# Patient Record
Sex: Female | Born: 1945
Health system: Southern US, Community
[De-identification: ages and names within clinical notes are randomized; demographics above are authoritative.]

## PROBLEM LIST (undated history)

## (undated) DIAGNOSIS — I10 Essential (primary) hypertension: Secondary | ICD-10-CM

## (undated) DIAGNOSIS — D649 Anemia, unspecified: Secondary | ICD-10-CM

## (undated) DIAGNOSIS — M1990 Unspecified osteoarthritis, unspecified site: Secondary | ICD-10-CM

## (undated) DIAGNOSIS — K219 Gastro-esophageal reflux disease without esophagitis: Secondary | ICD-10-CM

## (undated) DIAGNOSIS — H269 Unspecified cataract: Secondary | ICD-10-CM

## (undated) DIAGNOSIS — T7840XA Allergy, unspecified, initial encounter: Secondary | ICD-10-CM

## (undated) DIAGNOSIS — F419 Anxiety disorder, unspecified: Secondary | ICD-10-CM

## (undated) DIAGNOSIS — E785 Hyperlipidemia, unspecified: Secondary | ICD-10-CM

## (undated) DIAGNOSIS — F32A Depression, unspecified: Secondary | ICD-10-CM

## (undated) HISTORY — DX: Hyperlipidemia, unspecified: E78.5

## (undated) HISTORY — PX: EYE SURGERY: SHX253

## (undated) HISTORY — PX: APPENDECTOMY: SHX54

## (undated) HISTORY — DX: Essential (primary) hypertension: I10

## (undated) HISTORY — DX: Depression, unspecified: F32.A

## (undated) HISTORY — PX: BACK SURGERY: SHX140

## (undated) HISTORY — DX: Gastro-esophageal reflux disease without esophagitis: K21.9

## (undated) HISTORY — DX: Allergy, unspecified, initial encounter: T78.40XA

## (undated) HISTORY — DX: Anemia, unspecified: D64.9

## (undated) HISTORY — PX: TOTAL ABDOMINAL HYSTERECTOMY: SHX209

## (undated) HISTORY — PX: BREAST EXCISIONAL BIOPSY: SUR124

## (undated) HISTORY — PX: TUBAL LIGATION: SHX77

## (undated) HISTORY — DX: Anxiety disorder, unspecified: F41.9

## (undated) HISTORY — DX: Unspecified osteoarthritis, unspecified site: M19.90

## (undated) HISTORY — PX: BREAST CYST ASPIRATION: SHX578

## (undated) HISTORY — PX: FRACTURE SURGERY: SHX138

## (undated) HISTORY — PX: CATARACT EXTRACTION, BILATERAL: SHX1313

## (undated) HISTORY — DX: Unspecified cataract: H26.9

---

## 1999-08-03 ENCOUNTER — Other Ambulatory Visit: Admission: RE | Admit: 1999-08-03 | Discharge: 1999-08-03 | Payer: Self-pay | Admitting: Obstetrics and Gynecology

## 1999-12-21 ENCOUNTER — Encounter: Payer: Self-pay | Admitting: Obstetrics and Gynecology

## 1999-12-21 ENCOUNTER — Encounter: Admission: RE | Admit: 1999-12-21 | Discharge: 1999-12-21 | Payer: Self-pay | Admitting: Obstetrics and Gynecology

## 1999-12-30 ENCOUNTER — Encounter: Payer: Self-pay | Admitting: Internal Medicine

## 1999-12-30 ENCOUNTER — Encounter: Admission: RE | Admit: 1999-12-30 | Discharge: 1999-12-30 | Payer: Self-pay | Admitting: Internal Medicine

## 2000-08-31 ENCOUNTER — Encounter: Payer: Self-pay | Admitting: Internal Medicine

## 2000-08-31 ENCOUNTER — Encounter: Admission: RE | Admit: 2000-08-31 | Discharge: 2000-08-31 | Payer: Self-pay | Admitting: Internal Medicine

## 2000-09-20 ENCOUNTER — Other Ambulatory Visit: Admission: RE | Admit: 2000-09-20 | Discharge: 2000-09-20 | Payer: Self-pay | Admitting: Obstetrics and Gynecology

## 2001-10-06 ENCOUNTER — Other Ambulatory Visit: Admission: RE | Admit: 2001-10-06 | Discharge: 2001-10-06 | Payer: Self-pay | Admitting: Obstetrics and Gynecology

## 2002-09-18 ENCOUNTER — Other Ambulatory Visit: Admission: RE | Admit: 2002-09-18 | Discharge: 2002-09-18 | Payer: Self-pay | Admitting: Obstetrics and Gynecology

## 2003-05-13 ENCOUNTER — Encounter: Payer: Self-pay | Admitting: Internal Medicine

## 2003-05-13 ENCOUNTER — Encounter: Admission: RE | Admit: 2003-05-13 | Discharge: 2003-05-13 | Payer: Self-pay | Admitting: Internal Medicine

## 2003-05-31 ENCOUNTER — Encounter: Payer: Self-pay | Admitting: Obstetrics & Gynecology

## 2003-05-31 ENCOUNTER — Encounter: Admission: RE | Admit: 2003-05-31 | Discharge: 2003-05-31 | Payer: Self-pay | Admitting: Obstetrics & Gynecology

## 2003-06-13 ENCOUNTER — Ambulatory Visit (HOSPITAL_BASED_OUTPATIENT_CLINIC_OR_DEPARTMENT_OTHER): Admission: RE | Admit: 2003-06-13 | Discharge: 2003-06-13 | Payer: Self-pay | Admitting: General Surgery

## 2004-07-25 ENCOUNTER — Ambulatory Visit (HOSPITAL_COMMUNITY): Admission: RE | Admit: 2004-07-25 | Discharge: 2004-07-25 | Payer: Self-pay | Admitting: Family Medicine

## 2004-10-12 ENCOUNTER — Ambulatory Visit: Payer: Self-pay | Admitting: Internal Medicine

## 2005-01-28 ENCOUNTER — Ambulatory Visit: Payer: Self-pay | Admitting: Internal Medicine

## 2005-02-04 ENCOUNTER — Ambulatory Visit: Payer: Self-pay | Admitting: Internal Medicine

## 2005-02-16 ENCOUNTER — Ambulatory Visit: Payer: Self-pay | Admitting: Internal Medicine

## 2005-08-05 ENCOUNTER — Ambulatory Visit: Payer: Self-pay | Admitting: Internal Medicine

## 2005-08-05 ENCOUNTER — Encounter: Admission: RE | Admit: 2005-08-05 | Discharge: 2005-08-05 | Payer: Self-pay | Admitting: Internal Medicine

## 2005-09-06 ENCOUNTER — Ambulatory Visit: Payer: Self-pay | Admitting: Internal Medicine

## 2005-11-30 ENCOUNTER — Ambulatory Visit: Payer: Self-pay | Admitting: Internal Medicine

## 2005-12-08 ENCOUNTER — Ambulatory Visit: Payer: Self-pay | Admitting: Internal Medicine

## 2006-01-12 ENCOUNTER — Ambulatory Visit: Payer: Self-pay | Admitting: Internal Medicine

## 2006-06-07 ENCOUNTER — Ambulatory Visit: Payer: Self-pay | Admitting: Internal Medicine

## 2006-06-14 ENCOUNTER — Ambulatory Visit: Payer: Self-pay | Admitting: Internal Medicine

## 2006-07-14 ENCOUNTER — Ambulatory Visit: Payer: Self-pay | Admitting: Internal Medicine

## 2006-07-25 ENCOUNTER — Ambulatory Visit: Payer: Self-pay | Admitting: Internal Medicine

## 2006-08-23 ENCOUNTER — Ambulatory Visit: Payer: Self-pay | Admitting: Internal Medicine

## 2006-12-12 ENCOUNTER — Ambulatory Visit: Payer: Self-pay | Admitting: Internal Medicine

## 2006-12-12 LAB — CONVERTED CEMR LAB
Albumin: 4.2 g/dL (ref 3.5–5.2)
BUN: 18 mg/dL (ref 6–23)
Basophils Absolute: 0 10*3/uL (ref 0.0–0.1)
Basophils Relative: 0.3 % (ref 0.0–1.0)
Creatinine, Ser: 1.1 mg/dL (ref 0.4–1.2)
Eosinophils Absolute: 0.2 10*3/uL (ref 0.0–0.6)
Eosinophils Relative: 3.6 % (ref 0.0–5.0)
GFR calc Af Amer: 65 mL/min
GFR calc non Af Amer: 54 mL/min
HDL: 45.1 mg/dL (ref >39.0)
Lymphocytes Relative: 32.2 % (ref 12.0–46.0)
MCHC: 34.7 g/dL (ref 30.0–36.0)
MCV: 83.7 fL (ref 78.0–100.0)
Monocytes Absolute: 0.5 10*3/uL (ref 0.2–0.7)
Monocytes Relative: 10.1 % (ref 3.0–11.0)
Neutrophils Relative %: 53.8 % (ref 43.0–77.0)
Sodium: 141 meq/L (ref 135–145)
TSH: 2.37 microintl units/mL (ref 0.35–5.50)
Total Bilirubin: 0.5 mg/dL (ref 0.3–1.2)
Total CHOL/HDL Ratio: 4.4
Total Protein: 7.1 g/dL (ref 6.0–8.3)
Triglycerides: 167 mg/dL — ABNORMAL HIGH (ref 0–149)
VLDL: 33 mg/dL (ref 0–40)

## 2006-12-19 ENCOUNTER — Ambulatory Visit: Payer: Self-pay | Admitting: Internal Medicine

## 2006-12-19 ENCOUNTER — Encounter: Admission: RE | Admit: 2006-12-19 | Discharge: 2006-12-19 | Payer: Self-pay | Admitting: Internal Medicine

## 2010-12-10 ENCOUNTER — Encounter: Payer: Self-pay | Admitting: Gastroenterology

## 2010-12-17 NOTE — Letter (Signed)
Summary: Colonoscopy Date Change Letter  [facility]  [address] [place], [place] [address]   Phone: [phone]  Fax: [phone]      December 10, 2010 MRN: [id number]   Tabitha Adams [address] [place], [place]  [address]   Dear Tabitha Adams,   Previously you were recommended to have a repeat colonoscopy around this time. Your chart was recently reviewed by Dr. Claudette Head of [facility]. Follow up colonoscopy is now recommended in February 2015. This revised recommendation is based on current, nationally recognized guidelines for colorectal cancer screening and polyp surveillance. These guidelines are endorsed by the American Cancer Society, [employer] on Colorectal Cancer as well as numerous other major medical organizations.  Please understand that our recommendation assumes that you do not have any new symptoms such as bleeding, a change in bowel habits, anemia, or significant abdominal discomfort. If you do have any concerning GI symptoms or want to discuss the guideline recommendations, please call to arrange an office visit at your earliest convenience. Otherwise we will keep you in our reminder system and contact you 1-2 months prior to the date listed above to schedule your next colonoscopy.  Thank you,  Judie Petit T. Russella Dar, M.D.  [facility] [phone]

## 2013-10-26 ENCOUNTER — Encounter: Payer: Self-pay | Admitting: Gastroenterology

## 2014-05-30 ENCOUNTER — Encounter: Payer: Self-pay | Admitting: Gastroenterology

## 2014-11-19 DIAGNOSIS — Z1231 Encounter for screening mammogram for malignant neoplasm of breast: Secondary | ICD-10-CM | POA: Diagnosis not present

## 2014-11-26 DIAGNOSIS — M542 Cervicalgia: Secondary | ICD-10-CM | POA: Diagnosis not present

## 2014-11-26 DIAGNOSIS — M6240 Contracture of muscle, unspecified site: Secondary | ICD-10-CM | POA: Diagnosis not present

## 2014-11-26 DIAGNOSIS — M9903 Segmental and somatic dysfunction of lumbar region: Secondary | ICD-10-CM | POA: Diagnosis not present

## 2014-12-02 DIAGNOSIS — H26492 Other secondary cataract, left eye: Secondary | ICD-10-CM | POA: Diagnosis not present

## 2014-12-25 DIAGNOSIS — M6240 Contracture of muscle, unspecified site: Secondary | ICD-10-CM | POA: Diagnosis not present

## 2014-12-25 DIAGNOSIS — M9903 Segmental and somatic dysfunction of lumbar region: Secondary | ICD-10-CM | POA: Diagnosis not present

## 2014-12-25 DIAGNOSIS — M542 Cervicalgia: Secondary | ICD-10-CM | POA: Diagnosis not present

## 2015-01-02 DIAGNOSIS — M9903 Segmental and somatic dysfunction of lumbar region: Secondary | ICD-10-CM | POA: Diagnosis not present

## 2015-01-02 DIAGNOSIS — M542 Cervicalgia: Secondary | ICD-10-CM | POA: Diagnosis not present

## 2015-01-02 DIAGNOSIS — M6240 Contracture of muscle, unspecified site: Secondary | ICD-10-CM | POA: Diagnosis not present

## 2015-01-07 DIAGNOSIS — M542 Cervicalgia: Secondary | ICD-10-CM | POA: Diagnosis not present

## 2015-01-07 DIAGNOSIS — M6240 Contracture of muscle, unspecified site: Secondary | ICD-10-CM | POA: Diagnosis not present

## 2015-01-07 DIAGNOSIS — M9903 Segmental and somatic dysfunction of lumbar region: Secondary | ICD-10-CM | POA: Diagnosis not present

## 2015-01-22 DIAGNOSIS — M6240 Contracture of muscle, unspecified site: Secondary | ICD-10-CM | POA: Diagnosis not present

## 2015-01-22 DIAGNOSIS — M9903 Segmental and somatic dysfunction of lumbar region: Secondary | ICD-10-CM | POA: Diagnosis not present

## 2015-01-22 DIAGNOSIS — M542 Cervicalgia: Secondary | ICD-10-CM | POA: Diagnosis not present

## 2015-02-10 DIAGNOSIS — M779 Enthesopathy, unspecified: Secondary | ICD-10-CM | POA: Diagnosis not present

## 2015-02-10 DIAGNOSIS — M2041 Other hammer toe(s) (acquired), right foot: Secondary | ICD-10-CM | POA: Diagnosis not present

## 2015-02-10 DIAGNOSIS — M2042 Other hammer toe(s) (acquired), left foot: Secondary | ICD-10-CM | POA: Diagnosis not present

## 2015-02-24 DIAGNOSIS — R600 Localized edema: Secondary | ICD-10-CM | POA: Diagnosis not present

## 2015-02-24 DIAGNOSIS — G5761 Lesion of plantar nerve, right lower limb: Secondary | ICD-10-CM | POA: Diagnosis not present

## 2015-03-10 DIAGNOSIS — G5761 Lesion of plantar nerve, right lower limb: Secondary | ICD-10-CM | POA: Diagnosis not present

## 2015-03-10 DIAGNOSIS — M5387 Other specified dorsopathies, lumbosacral region: Secondary | ICD-10-CM | POA: Diagnosis not present

## 2015-03-10 DIAGNOSIS — M791 Myalgia: Secondary | ICD-10-CM | POA: Diagnosis not present

## 2015-03-10 DIAGNOSIS — M5382 Other specified dorsopathies, cervical region: Secondary | ICD-10-CM | POA: Diagnosis not present

## 2015-03-10 DIAGNOSIS — M5137 Other intervertebral disc degeneration, lumbosacral region: Secondary | ICD-10-CM | POA: Diagnosis not present

## 2015-03-10 DIAGNOSIS — M2011 Hallux valgus (acquired), right foot: Secondary | ICD-10-CM | POA: Diagnosis not present

## 2015-03-10 DIAGNOSIS — M2012 Hallux valgus (acquired), left foot: Secondary | ICD-10-CM | POA: Diagnosis not present

## 2015-03-11 DIAGNOSIS — M5136 Other intervertebral disc degeneration, lumbar region: Secondary | ICD-10-CM | POA: Diagnosis not present

## 2015-03-11 DIAGNOSIS — M419 Scoliosis, unspecified: Secondary | ICD-10-CM | POA: Diagnosis not present

## 2015-03-17 DIAGNOSIS — M4800 Spinal stenosis, site unspecified: Secondary | ICD-10-CM | POA: Diagnosis not present

## 2015-03-17 DIAGNOSIS — M5387 Other specified dorsopathies, lumbosacral region: Secondary | ICD-10-CM | POA: Diagnosis not present

## 2015-03-17 DIAGNOSIS — N200 Calculus of kidney: Secondary | ICD-10-CM | POA: Diagnosis not present

## 2015-03-17 DIAGNOSIS — M5137 Other intervertebral disc degeneration, lumbosacral region: Secondary | ICD-10-CM | POA: Diagnosis not present

## 2015-03-17 DIAGNOSIS — M791 Myalgia: Secondary | ICD-10-CM | POA: Diagnosis not present

## 2015-03-17 DIAGNOSIS — N2889 Other specified disorders of kidney and ureter: Secondary | ICD-10-CM | POA: Diagnosis not present

## 2015-03-17 DIAGNOSIS — Q6100 Congenital renal cyst, unspecified: Secondary | ICD-10-CM | POA: Diagnosis not present

## 2015-03-17 DIAGNOSIS — M5382 Other specified dorsopathies, cervical region: Secondary | ICD-10-CM | POA: Diagnosis not present

## 2015-03-25 DIAGNOSIS — M5137 Other intervertebral disc degeneration, lumbosacral region: Secondary | ICD-10-CM | POA: Diagnosis not present

## 2015-03-25 DIAGNOSIS — M5417 Radiculopathy, lumbosacral region: Secondary | ICD-10-CM | POA: Diagnosis not present

## 2015-03-25 DIAGNOSIS — M545 Low back pain: Secondary | ICD-10-CM | POA: Diagnosis not present

## 2015-03-27 DIAGNOSIS — G5761 Lesion of plantar nerve, right lower limb: Secondary | ICD-10-CM | POA: Diagnosis not present

## 2015-03-31 DIAGNOSIS — E119 Type 2 diabetes mellitus without complications: Secondary | ICD-10-CM | POA: Diagnosis not present

## 2015-03-31 DIAGNOSIS — E785 Hyperlipidemia, unspecified: Secondary | ICD-10-CM | POA: Diagnosis not present

## 2015-03-31 DIAGNOSIS — Z6829 Body mass index (BMI) 29.0-29.9, adult: Secondary | ICD-10-CM | POA: Diagnosis not present

## 2015-03-31 DIAGNOSIS — W5501XA Bitten by cat, initial encounter: Secondary | ICD-10-CM | POA: Diagnosis not present

## 2015-03-31 DIAGNOSIS — Z23 Encounter for immunization: Secondary | ICD-10-CM | POA: Diagnosis not present

## 2015-03-31 DIAGNOSIS — M859 Disorder of bone density and structure, unspecified: Secondary | ICD-10-CM | POA: Diagnosis not present

## 2015-03-31 DIAGNOSIS — K219 Gastro-esophageal reflux disease without esophagitis: Secondary | ICD-10-CM | POA: Diagnosis not present

## 2015-03-31 DIAGNOSIS — J301 Allergic rhinitis due to pollen: Secondary | ICD-10-CM | POA: Diagnosis not present

## 2015-04-08 DIAGNOSIS — R52 Pain, unspecified: Secondary | ICD-10-CM | POA: Diagnosis not present

## 2015-04-08 DIAGNOSIS — M791 Myalgia: Secondary | ICD-10-CM | POA: Diagnosis not present

## 2015-04-08 DIAGNOSIS — M4800 Spinal stenosis, site unspecified: Secondary | ICD-10-CM | POA: Diagnosis not present

## 2015-04-08 DIAGNOSIS — M5387 Other specified dorsopathies, lumbosacral region: Secondary | ICD-10-CM | POA: Diagnosis not present

## 2015-04-08 DIAGNOSIS — M5137 Other intervertebral disc degeneration, lumbosacral region: Secondary | ICD-10-CM | POA: Diagnosis not present

## 2015-04-08 DIAGNOSIS — M5382 Other specified dorsopathies, cervical region: Secondary | ICD-10-CM | POA: Diagnosis not present

## 2015-04-10 DIAGNOSIS — M859 Disorder of bone density and structure, unspecified: Secondary | ICD-10-CM | POA: Diagnosis not present

## 2015-04-10 DIAGNOSIS — M1990 Unspecified osteoarthritis, unspecified site: Secondary | ICD-10-CM | POA: Diagnosis not present

## 2015-04-10 DIAGNOSIS — R5383 Other fatigue: Secondary | ICD-10-CM | POA: Diagnosis not present

## 2015-04-10 DIAGNOSIS — E785 Hyperlipidemia, unspecified: Secondary | ICD-10-CM | POA: Diagnosis not present

## 2015-04-10 DIAGNOSIS — J301 Allergic rhinitis due to pollen: Secondary | ICD-10-CM | POA: Diagnosis not present

## 2015-04-17 DIAGNOSIS — G5761 Lesion of plantar nerve, right lower limb: Secondary | ICD-10-CM | POA: Diagnosis not present

## 2015-04-23 DIAGNOSIS — M5417 Radiculopathy, lumbosacral region: Secondary | ICD-10-CM | POA: Diagnosis not present

## 2015-04-23 DIAGNOSIS — M545 Low back pain: Secondary | ICD-10-CM | POA: Diagnosis not present

## 2015-04-23 DIAGNOSIS — M5137 Other intervertebral disc degeneration, lumbosacral region: Secondary | ICD-10-CM | POA: Diagnosis not present

## 2015-05-06 DIAGNOSIS — M5137 Other intervertebral disc degeneration, lumbosacral region: Secondary | ICD-10-CM | POA: Diagnosis not present

## 2015-05-06 DIAGNOSIS — M4800 Spinal stenosis, site unspecified: Secondary | ICD-10-CM | POA: Diagnosis not present

## 2015-05-06 DIAGNOSIS — M5387 Other specified dorsopathies, lumbosacral region: Secondary | ICD-10-CM | POA: Diagnosis not present

## 2015-05-06 DIAGNOSIS — M791 Myalgia: Secondary | ICD-10-CM | POA: Diagnosis not present

## 2015-05-06 DIAGNOSIS — M5382 Other specified dorsopathies, cervical region: Secondary | ICD-10-CM | POA: Diagnosis not present

## 2015-05-21 DIAGNOSIS — M545 Low back pain: Secondary | ICD-10-CM | POA: Diagnosis not present

## 2015-05-21 DIAGNOSIS — M5417 Radiculopathy, lumbosacral region: Secondary | ICD-10-CM | POA: Diagnosis not present

## 2015-05-21 DIAGNOSIS — M5137 Other intervertebral disc degeneration, lumbosacral region: Secondary | ICD-10-CM | POA: Diagnosis not present

## 2015-05-23 DIAGNOSIS — M545 Low back pain: Secondary | ICD-10-CM | POA: Diagnosis not present

## 2015-05-27 DIAGNOSIS — M545 Low back pain: Secondary | ICD-10-CM | POA: Diagnosis not present

## 2015-05-30 DIAGNOSIS — M545 Low back pain: Secondary | ICD-10-CM | POA: Diagnosis not present

## 2015-06-02 DIAGNOSIS — M545 Low back pain: Secondary | ICD-10-CM | POA: Diagnosis not present

## 2015-06-04 DIAGNOSIS — M5387 Other specified dorsopathies, lumbosacral region: Secondary | ICD-10-CM | POA: Diagnosis not present

## 2015-06-04 DIAGNOSIS — R52 Pain, unspecified: Secondary | ICD-10-CM | POA: Diagnosis not present

## 2015-06-04 DIAGNOSIS — M5137 Other intervertebral disc degeneration, lumbosacral region: Secondary | ICD-10-CM | POA: Diagnosis not present

## 2015-06-04 DIAGNOSIS — M791 Myalgia: Secondary | ICD-10-CM | POA: Diagnosis not present

## 2015-06-04 DIAGNOSIS — M4800 Spinal stenosis, site unspecified: Secondary | ICD-10-CM | POA: Diagnosis not present

## 2015-06-04 DIAGNOSIS — M545 Low back pain: Secondary | ICD-10-CM | POA: Diagnosis not present

## 2015-06-04 DIAGNOSIS — M5382 Other specified dorsopathies, cervical region: Secondary | ICD-10-CM | POA: Diagnosis not present

## 2015-06-10 DIAGNOSIS — M545 Low back pain: Secondary | ICD-10-CM | POA: Diagnosis not present

## 2015-06-12 DIAGNOSIS — M545 Low back pain: Secondary | ICD-10-CM | POA: Diagnosis not present

## 2015-06-16 DIAGNOSIS — M545 Low back pain: Secondary | ICD-10-CM | POA: Diagnosis not present

## 2015-06-18 DIAGNOSIS — M545 Low back pain: Secondary | ICD-10-CM | POA: Diagnosis not present

## 2015-06-24 DIAGNOSIS — M545 Low back pain: Secondary | ICD-10-CM | POA: Diagnosis not present

## 2015-06-27 DIAGNOSIS — M545 Low back pain: Secondary | ICD-10-CM | POA: Diagnosis not present

## 2015-06-30 DIAGNOSIS — M545 Low back pain: Secondary | ICD-10-CM | POA: Diagnosis not present

## 2015-07-01 DIAGNOSIS — K219 Gastro-esophageal reflux disease without esophagitis: Secondary | ICD-10-CM | POA: Diagnosis not present

## 2015-07-01 DIAGNOSIS — J301 Allergic rhinitis due to pollen: Secondary | ICD-10-CM | POA: Diagnosis not present

## 2015-07-01 DIAGNOSIS — Z6828 Body mass index (BMI) 28.0-28.9, adult: Secondary | ICD-10-CM | POA: Diagnosis not present

## 2015-07-01 DIAGNOSIS — E785 Hyperlipidemia, unspecified: Secondary | ICD-10-CM | POA: Diagnosis not present

## 2015-07-01 DIAGNOSIS — M1990 Unspecified osteoarthritis, unspecified site: Secondary | ICD-10-CM | POA: Diagnosis not present

## 2015-07-01 DIAGNOSIS — M545 Low back pain: Secondary | ICD-10-CM | POA: Diagnosis not present

## 2015-07-07 DIAGNOSIS — M545 Low back pain: Secondary | ICD-10-CM | POA: Diagnosis not present

## 2015-07-10 DIAGNOSIS — M545 Low back pain: Secondary | ICD-10-CM | POA: Diagnosis not present

## 2015-07-11 DIAGNOSIS — M545 Low back pain: Secondary | ICD-10-CM | POA: Diagnosis not present

## 2015-07-11 DIAGNOSIS — M5417 Radiculopathy, lumbosacral region: Secondary | ICD-10-CM | POA: Diagnosis not present

## 2015-07-11 DIAGNOSIS — M5137 Other intervertebral disc degeneration, lumbosacral region: Secondary | ICD-10-CM | POA: Diagnosis not present

## 2015-07-15 DIAGNOSIS — M545 Low back pain: Secondary | ICD-10-CM | POA: Diagnosis not present

## 2015-07-22 DIAGNOSIS — M545 Low back pain: Secondary | ICD-10-CM | POA: Diagnosis not present

## 2015-07-23 DIAGNOSIS — M5382 Other specified dorsopathies, cervical region: Secondary | ICD-10-CM | POA: Diagnosis not present

## 2015-07-23 DIAGNOSIS — M791 Myalgia: Secondary | ICD-10-CM | POA: Diagnosis not present

## 2015-07-23 DIAGNOSIS — M5387 Other specified dorsopathies, lumbosacral region: Secondary | ICD-10-CM | POA: Diagnosis not present

## 2015-07-23 DIAGNOSIS — M461 Sacroiliitis, not elsewhere classified: Secondary | ICD-10-CM | POA: Diagnosis not present

## 2015-07-23 DIAGNOSIS — R52 Pain, unspecified: Secondary | ICD-10-CM | POA: Diagnosis not present

## 2015-07-23 DIAGNOSIS — M5137 Other intervertebral disc degeneration, lumbosacral region: Secondary | ICD-10-CM | POA: Diagnosis not present

## 2015-07-23 DIAGNOSIS — M4800 Spinal stenosis, site unspecified: Secondary | ICD-10-CM | POA: Diagnosis not present

## 2015-07-24 DIAGNOSIS — M545 Low back pain: Secondary | ICD-10-CM | POA: Diagnosis not present

## 2015-07-29 DIAGNOSIS — M545 Low back pain: Secondary | ICD-10-CM | POA: Diagnosis not present

## 2015-08-15 DIAGNOSIS — M545 Low back pain: Secondary | ICD-10-CM | POA: Diagnosis not present

## 2015-08-19 DIAGNOSIS — M545 Low back pain: Secondary | ICD-10-CM | POA: Diagnosis not present

## 2015-08-21 DIAGNOSIS — M545 Low back pain: Secondary | ICD-10-CM | POA: Diagnosis not present

## 2015-08-25 DIAGNOSIS — M545 Low back pain: Secondary | ICD-10-CM | POA: Diagnosis not present

## 2015-08-28 DIAGNOSIS — R0602 Shortness of breath: Secondary | ICD-10-CM | POA: Diagnosis not present

## 2015-08-28 DIAGNOSIS — Z6828 Body mass index (BMI) 28.0-28.9, adult: Secondary | ICD-10-CM | POA: Diagnosis not present

## 2015-08-28 DIAGNOSIS — M545 Low back pain: Secondary | ICD-10-CM | POA: Diagnosis not present

## 2015-08-28 DIAGNOSIS — M1990 Unspecified osteoarthritis, unspecified site: Secondary | ICD-10-CM | POA: Diagnosis not present

## 2015-08-28 DIAGNOSIS — Z23 Encounter for immunization: Secondary | ICD-10-CM | POA: Diagnosis not present

## 2015-09-01 DIAGNOSIS — M545 Low back pain: Secondary | ICD-10-CM | POA: Diagnosis not present

## 2015-09-04 DIAGNOSIS — M545 Low back pain: Secondary | ICD-10-CM | POA: Diagnosis not present

## 2015-09-08 DIAGNOSIS — Z6828 Body mass index (BMI) 28.0-28.9, adult: Secondary | ICD-10-CM | POA: Diagnosis not present

## 2015-09-08 DIAGNOSIS — R110 Nausea: Secondary | ICD-10-CM | POA: Diagnosis not present

## 2015-09-08 DIAGNOSIS — R42 Dizziness and giddiness: Secondary | ICD-10-CM | POA: Diagnosis not present

## 2015-09-09 DIAGNOSIS — R42 Dizziness and giddiness: Secondary | ICD-10-CM | POA: Diagnosis not present

## 2015-09-09 DIAGNOSIS — R799 Abnormal finding of blood chemistry, unspecified: Secondary | ICD-10-CM | POA: Diagnosis not present

## 2015-09-09 DIAGNOSIS — E871 Hypo-osmolality and hyponatremia: Secondary | ICD-10-CM | POA: Diagnosis not present

## 2015-09-09 DIAGNOSIS — N390 Urinary tract infection, site not specified: Secondary | ICD-10-CM | POA: Diagnosis not present

## 2015-09-09 DIAGNOSIS — Z6828 Body mass index (BMI) 28.0-28.9, adult: Secondary | ICD-10-CM | POA: Diagnosis not present

## 2015-09-10 DIAGNOSIS — E871 Hypo-osmolality and hyponatremia: Secondary | ICD-10-CM | POA: Diagnosis not present

## 2015-09-11 DIAGNOSIS — N390 Urinary tract infection, site not specified: Secondary | ICD-10-CM | POA: Diagnosis not present

## 2015-09-11 DIAGNOSIS — E039 Hypothyroidism, unspecified: Secondary | ICD-10-CM | POA: Diagnosis not present

## 2015-09-11 DIAGNOSIS — M545 Low back pain: Secondary | ICD-10-CM | POA: Diagnosis not present

## 2015-09-12 DIAGNOSIS — R0602 Shortness of breath: Secondary | ICD-10-CM | POA: Diagnosis not present

## 2015-09-15 DIAGNOSIS — M545 Low back pain: Secondary | ICD-10-CM | POA: Diagnosis not present

## 2015-09-16 DIAGNOSIS — M545 Low back pain: Secondary | ICD-10-CM | POA: Diagnosis not present

## 2015-09-18 DIAGNOSIS — N390 Urinary tract infection, site not specified: Secondary | ICD-10-CM | POA: Diagnosis not present

## 2015-09-18 DIAGNOSIS — E871 Hypo-osmolality and hyponatremia: Secondary | ICD-10-CM | POA: Diagnosis not present

## 2015-09-23 DIAGNOSIS — Z6828 Body mass index (BMI) 28.0-28.9, adult: Secondary | ICD-10-CM | POA: Diagnosis not present

## 2015-09-23 DIAGNOSIS — E663 Overweight: Secondary | ICD-10-CM | POA: Diagnosis not present

## 2015-09-23 DIAGNOSIS — E785 Hyperlipidemia, unspecified: Secondary | ICD-10-CM | POA: Diagnosis not present

## 2015-09-23 DIAGNOSIS — M8580 Other specified disorders of bone density and structure, unspecified site: Secondary | ICD-10-CM | POA: Diagnosis not present

## 2015-09-23 DIAGNOSIS — M545 Low back pain: Secondary | ICD-10-CM | POA: Diagnosis not present

## 2015-09-23 DIAGNOSIS — M859 Disorder of bone density and structure, unspecified: Secondary | ICD-10-CM | POA: Diagnosis not present

## 2015-09-26 DIAGNOSIS — M545 Low back pain: Secondary | ICD-10-CM | POA: Diagnosis not present

## 2015-09-29 DIAGNOSIS — M545 Low back pain: Secondary | ICD-10-CM | POA: Diagnosis not present

## 2015-10-01 DIAGNOSIS — H26493 Other secondary cataract, bilateral: Secondary | ICD-10-CM | POA: Diagnosis not present

## 2015-10-01 DIAGNOSIS — M545 Low back pain: Secondary | ICD-10-CM | POA: Diagnosis not present

## 2015-10-08 DIAGNOSIS — M545 Low back pain: Secondary | ICD-10-CM | POA: Diagnosis not present

## 2015-10-14 DIAGNOSIS — M545 Low back pain: Secondary | ICD-10-CM | POA: Diagnosis not present

## 2015-10-17 DIAGNOSIS — M545 Low back pain: Secondary | ICD-10-CM | POA: Diagnosis not present

## 2015-10-20 DIAGNOSIS — M791 Myalgia: Secondary | ICD-10-CM | POA: Diagnosis not present

## 2015-10-20 DIAGNOSIS — M546 Pain in thoracic spine: Secondary | ICD-10-CM | POA: Diagnosis not present

## 2015-10-20 DIAGNOSIS — M4800 Spinal stenosis, site unspecified: Secondary | ICD-10-CM | POA: Diagnosis not present

## 2015-10-20 DIAGNOSIS — M545 Low back pain: Secondary | ICD-10-CM | POA: Diagnosis not present

## 2015-10-20 DIAGNOSIS — H26493 Other secondary cataract, bilateral: Secondary | ICD-10-CM | POA: Diagnosis not present

## 2015-10-20 DIAGNOSIS — M5137 Other intervertebral disc degeneration, lumbosacral region: Secondary | ICD-10-CM | POA: Diagnosis not present

## 2015-10-20 DIAGNOSIS — M461 Sacroiliitis, not elsewhere classified: Secondary | ICD-10-CM | POA: Diagnosis not present

## 2015-10-22 DIAGNOSIS — M545 Low back pain: Secondary | ICD-10-CM | POA: Diagnosis not present

## 2015-10-24 DIAGNOSIS — M546 Pain in thoracic spine: Secondary | ICD-10-CM | POA: Diagnosis not present

## 2015-10-30 DIAGNOSIS — J301 Allergic rhinitis due to pollen: Secondary | ICD-10-CM | POA: Diagnosis not present

## 2015-10-30 DIAGNOSIS — Z6828 Body mass index (BMI) 28.0-28.9, adult: Secondary | ICD-10-CM | POA: Diagnosis not present

## 2015-10-30 DIAGNOSIS — M1990 Unspecified osteoarthritis, unspecified site: Secondary | ICD-10-CM | POA: Diagnosis not present

## 2015-10-30 DIAGNOSIS — E785 Hyperlipidemia, unspecified: Secondary | ICD-10-CM | POA: Diagnosis not present

## 2015-10-30 DIAGNOSIS — K219 Gastro-esophageal reflux disease without esophagitis: Secondary | ICD-10-CM | POA: Diagnosis not present

## 2015-10-31 DIAGNOSIS — M791 Myalgia: Secondary | ICD-10-CM | POA: Diagnosis not present

## 2015-10-31 DIAGNOSIS — M546 Pain in thoracic spine: Secondary | ICD-10-CM | POA: Diagnosis not present

## 2015-10-31 DIAGNOSIS — M461 Sacroiliitis, not elsewhere classified: Secondary | ICD-10-CM | POA: Diagnosis not present

## 2015-10-31 DIAGNOSIS — M545 Low back pain: Secondary | ICD-10-CM | POA: Diagnosis not present

## 2015-10-31 DIAGNOSIS — M4800 Spinal stenosis, site unspecified: Secondary | ICD-10-CM | POA: Diagnosis not present

## 2015-10-31 DIAGNOSIS — M5137 Other intervertebral disc degeneration, lumbosacral region: Secondary | ICD-10-CM | POA: Diagnosis not present

## 2015-11-04 DIAGNOSIS — M545 Low back pain: Secondary | ICD-10-CM | POA: Diagnosis not present

## 2015-11-12 DIAGNOSIS — M545 Low back pain: Secondary | ICD-10-CM | POA: Diagnosis not present

## 2015-11-14 DIAGNOSIS — M47894 Other spondylosis, thoracic region: Secondary | ICD-10-CM | POA: Diagnosis not present

## 2015-11-14 DIAGNOSIS — M542 Cervicalgia: Secondary | ICD-10-CM | POA: Diagnosis not present

## 2015-11-19 DIAGNOSIS — M545 Low back pain: Secondary | ICD-10-CM | POA: Diagnosis not present

## 2015-11-24 DIAGNOSIS — M545 Low back pain: Secondary | ICD-10-CM | POA: Diagnosis not present

## 2015-11-28 DIAGNOSIS — M47894 Other spondylosis, thoracic region: Secondary | ICD-10-CM | POA: Diagnosis not present

## 2015-12-01 DIAGNOSIS — M545 Low back pain: Secondary | ICD-10-CM | POA: Diagnosis not present

## 2015-12-09 DIAGNOSIS — M461 Sacroiliitis, not elsewhere classified: Secondary | ICD-10-CM | POA: Diagnosis not present

## 2015-12-09 DIAGNOSIS — M5137 Other intervertebral disc degeneration, lumbosacral region: Secondary | ICD-10-CM | POA: Diagnosis not present

## 2015-12-09 DIAGNOSIS — M546 Pain in thoracic spine: Secondary | ICD-10-CM | POA: Diagnosis not present

## 2015-12-09 DIAGNOSIS — M791 Myalgia: Secondary | ICD-10-CM | POA: Diagnosis not present

## 2015-12-09 DIAGNOSIS — M4800 Spinal stenosis, site unspecified: Secondary | ICD-10-CM | POA: Diagnosis not present

## 2015-12-11 DIAGNOSIS — M545 Low back pain: Secondary | ICD-10-CM | POA: Diagnosis not present

## 2015-12-16 DIAGNOSIS — M545 Low back pain: Secondary | ICD-10-CM | POA: Diagnosis not present

## 2015-12-18 DIAGNOSIS — M47814 Spondylosis without myelopathy or radiculopathy, thoracic region: Secondary | ICD-10-CM | POA: Diagnosis not present

## 2015-12-25 DIAGNOSIS — M545 Low back pain: Secondary | ICD-10-CM | POA: Diagnosis not present

## 2015-12-29 DIAGNOSIS — M461 Sacroiliitis, not elsewhere classified: Secondary | ICD-10-CM | POA: Diagnosis not present

## 2015-12-29 DIAGNOSIS — M5137 Other intervertebral disc degeneration, lumbosacral region: Secondary | ICD-10-CM | POA: Diagnosis not present

## 2015-12-29 DIAGNOSIS — M546 Pain in thoracic spine: Secondary | ICD-10-CM | POA: Diagnosis not present

## 2015-12-29 DIAGNOSIS — M4800 Spinal stenosis, site unspecified: Secondary | ICD-10-CM | POA: Diagnosis not present

## 2015-12-29 DIAGNOSIS — M791 Myalgia: Secondary | ICD-10-CM | POA: Diagnosis not present

## 2015-12-31 DIAGNOSIS — M545 Low back pain: Secondary | ICD-10-CM | POA: Diagnosis not present

## 2016-01-13 DIAGNOSIS — M545 Low back pain: Secondary | ICD-10-CM | POA: Diagnosis not present

## 2016-01-30 DIAGNOSIS — E785 Hyperlipidemia, unspecified: Secondary | ICD-10-CM | POA: Diagnosis not present

## 2016-01-30 DIAGNOSIS — M8580 Other specified disorders of bone density and structure, unspecified site: Secondary | ICD-10-CM | POA: Diagnosis not present

## 2016-01-30 DIAGNOSIS — K219 Gastro-esophageal reflux disease without esophagitis: Secondary | ICD-10-CM | POA: Diagnosis not present

## 2016-01-30 DIAGNOSIS — J301 Allergic rhinitis due to pollen: Secondary | ICD-10-CM | POA: Diagnosis not present

## 2016-01-30 DIAGNOSIS — Z6830 Body mass index (BMI) 30.0-30.9, adult: Secondary | ICD-10-CM | POA: Diagnosis not present

## 2016-01-30 DIAGNOSIS — M545 Low back pain: Secondary | ICD-10-CM | POA: Diagnosis not present

## 2016-01-30 DIAGNOSIS — K5909 Other constipation: Secondary | ICD-10-CM | POA: Diagnosis not present

## 2016-02-03 DIAGNOSIS — J301 Allergic rhinitis due to pollen: Secondary | ICD-10-CM | POA: Diagnosis not present

## 2016-02-03 DIAGNOSIS — R5383 Other fatigue: Secondary | ICD-10-CM | POA: Diagnosis not present

## 2016-02-03 DIAGNOSIS — M8580 Other specified disorders of bone density and structure, unspecified site: Secondary | ICD-10-CM | POA: Diagnosis not present

## 2016-02-03 DIAGNOSIS — E785 Hyperlipidemia, unspecified: Secondary | ICD-10-CM | POA: Diagnosis not present

## 2016-02-03 DIAGNOSIS — K219 Gastro-esophageal reflux disease without esophagitis: Secondary | ICD-10-CM | POA: Diagnosis not present

## 2016-02-06 DIAGNOSIS — Z6829 Body mass index (BMI) 29.0-29.9, adult: Secondary | ICD-10-CM | POA: Diagnosis not present

## 2016-02-06 DIAGNOSIS — E785 Hyperlipidemia, unspecified: Secondary | ICD-10-CM | POA: Diagnosis not present

## 2016-02-06 DIAGNOSIS — R195 Other fecal abnormalities: Secondary | ICD-10-CM | POA: Diagnosis not present

## 2016-02-06 DIAGNOSIS — Z0001 Encounter for general adult medical examination with abnormal findings: Secondary | ICD-10-CM | POA: Diagnosis not present

## 2016-02-06 DIAGNOSIS — R9431 Abnormal electrocardiogram [ECG] [EKG]: Secondary | ICD-10-CM | POA: Diagnosis not present

## 2016-02-06 DIAGNOSIS — K5900 Constipation, unspecified: Secondary | ICD-10-CM | POA: Diagnosis not present

## 2016-02-06 DIAGNOSIS — R319 Hematuria, unspecified: Secondary | ICD-10-CM | POA: Diagnosis not present

## 2016-02-06 DIAGNOSIS — R94130 Abnormal response to nerve stimulation, unspecified: Secondary | ICD-10-CM | POA: Diagnosis not present

## 2016-02-06 DIAGNOSIS — I10 Essential (primary) hypertension: Secondary | ICD-10-CM | POA: Diagnosis not present

## 2016-02-10 DIAGNOSIS — M859 Disorder of bone density and structure, unspecified: Secondary | ICD-10-CM | POA: Diagnosis not present

## 2016-02-10 DIAGNOSIS — R9431 Abnormal electrocardiogram [ECG] [EKG]: Secondary | ICD-10-CM | POA: Diagnosis not present

## 2016-02-16 DIAGNOSIS — H16223 Keratoconjunctivitis sicca, not specified as Sjogren's, bilateral: Secondary | ICD-10-CM | POA: Diagnosis not present

## 2016-03-05 DIAGNOSIS — R3915 Urgency of urination: Secondary | ICD-10-CM | POA: Diagnosis not present

## 2016-03-05 DIAGNOSIS — R3911 Hesitancy of micturition: Secondary | ICD-10-CM | POA: Diagnosis not present

## 2016-03-05 DIAGNOSIS — R3914 Feeling of incomplete bladder emptying: Secondary | ICD-10-CM | POA: Diagnosis not present

## 2016-03-05 DIAGNOSIS — R351 Nocturia: Secondary | ICD-10-CM | POA: Diagnosis not present

## 2016-03-05 DIAGNOSIS — R32 Unspecified urinary incontinence: Secondary | ICD-10-CM | POA: Diagnosis not present

## 2016-03-05 DIAGNOSIS — R350 Frequency of micturition: Secondary | ICD-10-CM | POA: Diagnosis not present

## 2016-03-05 DIAGNOSIS — R319 Hematuria, unspecified: Secondary | ICD-10-CM | POA: Diagnosis not present

## 2016-03-08 DIAGNOSIS — R319 Hematuria, unspecified: Secondary | ICD-10-CM | POA: Diagnosis not present

## 2016-03-08 DIAGNOSIS — D3002 Benign neoplasm of left kidney: Secondary | ICD-10-CM | POA: Diagnosis not present

## 2016-03-11 DIAGNOSIS — K5900 Constipation, unspecified: Secondary | ICD-10-CM | POA: Diagnosis not present

## 2016-03-11 DIAGNOSIS — D500 Iron deficiency anemia secondary to blood loss (chronic): Secondary | ICD-10-CM | POA: Diagnosis not present

## 2016-03-17 DIAGNOSIS — R32 Unspecified urinary incontinence: Secondary | ICD-10-CM | POA: Diagnosis not present

## 2016-03-17 DIAGNOSIS — R351 Nocturia: Secondary | ICD-10-CM | POA: Diagnosis not present

## 2016-03-17 DIAGNOSIS — R3915 Urgency of urination: Secondary | ICD-10-CM | POA: Diagnosis not present

## 2016-03-17 DIAGNOSIS — N281 Cyst of kidney, acquired: Secondary | ICD-10-CM | POA: Diagnosis not present

## 2016-03-17 DIAGNOSIS — R3914 Feeling of incomplete bladder emptying: Secondary | ICD-10-CM | POA: Diagnosis not present

## 2016-03-17 DIAGNOSIS — D1771 Benign lipomatous neoplasm of kidney: Secondary | ICD-10-CM | POA: Diagnosis not present

## 2016-03-17 DIAGNOSIS — R319 Hematuria, unspecified: Secondary | ICD-10-CM | POA: Diagnosis not present

## 2016-03-17 DIAGNOSIS — R3911 Hesitancy of micturition: Secondary | ICD-10-CM | POA: Diagnosis not present

## 2016-03-26 DIAGNOSIS — D1771 Benign lipomatous neoplasm of kidney: Secondary | ICD-10-CM | POA: Diagnosis not present

## 2016-03-26 DIAGNOSIS — R3914 Feeling of incomplete bladder emptying: Secondary | ICD-10-CM | POA: Diagnosis not present

## 2016-03-26 DIAGNOSIS — R3915 Urgency of urination: Secondary | ICD-10-CM | POA: Diagnosis not present

## 2016-03-26 DIAGNOSIS — R351 Nocturia: Secondary | ICD-10-CM | POA: Diagnosis not present

## 2016-03-26 DIAGNOSIS — N281 Cyst of kidney, acquired: Secondary | ICD-10-CM | POA: Diagnosis not present

## 2016-03-26 DIAGNOSIS — R3911 Hesitancy of micturition: Secondary | ICD-10-CM | POA: Diagnosis not present

## 2016-03-26 DIAGNOSIS — R319 Hematuria, unspecified: Secondary | ICD-10-CM | POA: Diagnosis not present

## 2016-03-26 DIAGNOSIS — R32 Unspecified urinary incontinence: Secondary | ICD-10-CM | POA: Diagnosis not present

## 2016-04-09 DIAGNOSIS — N281 Cyst of kidney, acquired: Secondary | ICD-10-CM | POA: Diagnosis not present

## 2016-04-09 DIAGNOSIS — R351 Nocturia: Secondary | ICD-10-CM | POA: Diagnosis not present

## 2016-04-09 DIAGNOSIS — R32 Unspecified urinary incontinence: Secondary | ICD-10-CM | POA: Diagnosis not present

## 2016-04-09 DIAGNOSIS — R3911 Hesitancy of micturition: Secondary | ICD-10-CM | POA: Diagnosis not present

## 2016-04-09 DIAGNOSIS — R3914 Feeling of incomplete bladder emptying: Secondary | ICD-10-CM | POA: Diagnosis not present

## 2016-04-09 DIAGNOSIS — D1771 Benign lipomatous neoplasm of kidney: Secondary | ICD-10-CM | POA: Diagnosis not present

## 2016-04-09 DIAGNOSIS — R3915 Urgency of urination: Secondary | ICD-10-CM | POA: Diagnosis not present

## 2016-04-09 DIAGNOSIS — R319 Hematuria, unspecified: Secondary | ICD-10-CM | POA: Diagnosis not present

## 2016-04-16 DIAGNOSIS — K2980 Duodenitis without bleeding: Secondary | ICD-10-CM | POA: Diagnosis not present

## 2016-04-16 DIAGNOSIS — D131 Benign neoplasm of stomach: Secondary | ICD-10-CM | POA: Diagnosis not present

## 2016-04-16 DIAGNOSIS — K625 Hemorrhage of anus and rectum: Secondary | ICD-10-CM | POA: Diagnosis not present

## 2016-04-16 DIAGNOSIS — D492 Neoplasm of unspecified behavior of bone, soft tissue, and skin: Secondary | ICD-10-CM | POA: Diagnosis not present

## 2016-04-16 DIAGNOSIS — D130 Benign neoplasm of esophagus: Secondary | ICD-10-CM | POA: Diagnosis not present

## 2016-04-16 DIAGNOSIS — K5900 Constipation, unspecified: Secondary | ICD-10-CM | POA: Diagnosis not present

## 2016-04-16 DIAGNOSIS — K219 Gastro-esophageal reflux disease without esophagitis: Secondary | ICD-10-CM | POA: Diagnosis not present

## 2016-04-16 DIAGNOSIS — D175 Benign lipomatous neoplasm of intra-abdominal organs: Secondary | ICD-10-CM | POA: Diagnosis not present

## 2016-04-16 DIAGNOSIS — K2950 Unspecified chronic gastritis without bleeding: Secondary | ICD-10-CM | POA: Diagnosis not present

## 2016-06-03 DIAGNOSIS — E785 Hyperlipidemia, unspecified: Secondary | ICD-10-CM | POA: Diagnosis not present

## 2016-06-03 DIAGNOSIS — M1990 Unspecified osteoarthritis, unspecified site: Secondary | ICD-10-CM | POA: Diagnosis not present

## 2016-06-03 DIAGNOSIS — Z6828 Body mass index (BMI) 28.0-28.9, adult: Secondary | ICD-10-CM | POA: Diagnosis not present

## 2016-06-03 DIAGNOSIS — R740 Nonspecific elevation of levels of transaminase and lactic acid dehydrogenase [LDH]: Secondary | ICD-10-CM | POA: Diagnosis not present

## 2016-06-03 DIAGNOSIS — J301 Allergic rhinitis due to pollen: Secondary | ICD-10-CM | POA: Diagnosis not present

## 2016-06-03 DIAGNOSIS — K219 Gastro-esophageal reflux disease without esophagitis: Secondary | ICD-10-CM | POA: Diagnosis not present

## 2016-06-09 DIAGNOSIS — R252 Cramp and spasm: Secondary | ICD-10-CM | POA: Diagnosis not present

## 2016-06-09 DIAGNOSIS — I10 Essential (primary) hypertension: Secondary | ICD-10-CM | POA: Diagnosis not present

## 2016-06-09 DIAGNOSIS — M859 Disorder of bone density and structure, unspecified: Secondary | ICD-10-CM | POA: Diagnosis not present

## 2016-06-09 DIAGNOSIS — M1990 Unspecified osteoarthritis, unspecified site: Secondary | ICD-10-CM | POA: Diagnosis not present

## 2016-06-09 DIAGNOSIS — E785 Hyperlipidemia, unspecified: Secondary | ICD-10-CM | POA: Diagnosis not present

## 2016-06-09 DIAGNOSIS — K219 Gastro-esophageal reflux disease without esophagitis: Secondary | ICD-10-CM | POA: Diagnosis not present

## 2016-06-10 DIAGNOSIS — D649 Anemia, unspecified: Secondary | ICD-10-CM | POA: Diagnosis not present

## 2016-06-10 DIAGNOSIS — K5900 Constipation, unspecified: Secondary | ICD-10-CM | POA: Diagnosis not present

## 2016-06-11 DIAGNOSIS — R739 Hyperglycemia, unspecified: Secondary | ICD-10-CM | POA: Diagnosis not present

## 2016-06-11 DIAGNOSIS — Z6828 Body mass index (BMI) 28.0-28.9, adult: Secondary | ICD-10-CM | POA: Diagnosis not present

## 2016-06-11 DIAGNOSIS — E785 Hyperlipidemia, unspecified: Secondary | ICD-10-CM | POA: Diagnosis not present

## 2016-06-11 LAB — HEPATIC FUNCTION PANEL
ALK PHOS: 90 U/L (ref 25–125)
ALT: 21 U/L (ref 7–35)
AST: 27 U/L (ref 13–35)
Bilirubin, Total: 0.4 mg/dL

## 2016-06-11 LAB — BASIC METABOLIC PANEL
BUN: 19 mg/dL (ref 4–21)
Creatinine: 0.8 mg/dL (ref 0.5–1.1)
GLUCOSE: 101 mg/dL
POTASSIUM: 4.3 mmol/L (ref 3.4–5.3)
Sodium: 140 mmol/L (ref 137–147)

## 2016-06-11 LAB — CBC AND DIFFERENTIAL
HEMATOCRIT: 37 % (ref 36–46)
Hemoglobin: 12.4 g/dL (ref 12.0–16.0)
Neutrophils Absolute: 4294 /uL
PLATELETS: 279 10*3/uL (ref 150–399)
WBC: 6.4 10*3/mL

## 2016-06-11 LAB — LIPID PANEL
Cholesterol: 217 mg/dL — AB (ref 0–200)
HDL: 53 mg/dL (ref 35–70)
LDL CALC: 141 mg/dL
Triglycerides: 113 mg/dL (ref 40–160)

## 2016-06-11 LAB — TSH: TSH: 0.85 u[IU]/mL (ref 0.41–5.90)

## 2016-06-16 DIAGNOSIS — Z1231 Encounter for screening mammogram for malignant neoplasm of breast: Secondary | ICD-10-CM | POA: Diagnosis not present

## 2016-06-23 DIAGNOSIS — T6591XA Toxic effect of unspecified substance, accidental (unintentional), initial encounter: Secondary | ICD-10-CM | POA: Diagnosis not present

## 2016-06-23 DIAGNOSIS — H10212 Acute toxic conjunctivitis, left eye: Secondary | ICD-10-CM | POA: Diagnosis not present

## 2016-06-24 DIAGNOSIS — H10212 Acute toxic conjunctivitis, left eye: Secondary | ICD-10-CM | POA: Diagnosis not present

## 2016-06-24 DIAGNOSIS — T6591XA Toxic effect of unspecified substance, accidental (unintentional), initial encounter: Secondary | ICD-10-CM | POA: Diagnosis not present

## 2016-07-13 ENCOUNTER — Ambulatory Visit (INDEPENDENT_AMBULATORY_CARE_PROVIDER_SITE_OTHER): Payer: Medicare Other | Admitting: Family Medicine

## 2016-07-13 ENCOUNTER — Encounter: Payer: Self-pay | Admitting: Family Medicine

## 2016-07-13 VITALS — BP 108/70 | HR 82 | Temp 98.1°F | Resp 12 | Ht 59.0 in | Wt 141.2 lb

## 2016-07-13 DIAGNOSIS — G8929 Other chronic pain: Secondary | ICD-10-CM

## 2016-07-13 DIAGNOSIS — E785 Hyperlipidemia, unspecified: Secondary | ICD-10-CM

## 2016-07-13 DIAGNOSIS — M549 Dorsalgia, unspecified: Secondary | ICD-10-CM

## 2016-07-13 DIAGNOSIS — J309 Allergic rhinitis, unspecified: Secondary | ICD-10-CM

## 2016-07-13 DIAGNOSIS — G894 Chronic pain syndrome: Secondary | ICD-10-CM

## 2016-07-13 DIAGNOSIS — K219 Gastro-esophageal reflux disease without esophagitis: Secondary | ICD-10-CM | POA: Diagnosis not present

## 2016-07-13 MED ORDER — DULOXETINE HCL 30 MG PO CPEP: 30.0000 mg | ORAL_CAPSULE | Freq: Every day | ORAL | 1 refills | Status: DC

## 2016-07-13 MED ORDER — TRAMADOL HCL 50 MG PO TABS: 50.0000 mg | ORAL_TABLET | Freq: Every day | ORAL | 1 refills | Status: DC
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
## 2016-07-13 NOTE — Patient Instructions (Addendum)
A few things to remember from today's visit:   Gastroesophageal reflux disease without esophagitis  Chronic back pain - Plan: traMADol (ULTRAM) 50 MG tablet, DULoxetine (CYMBALTA) 30 MG capsule  Hyperlipidemia  Allergic rhinitis, unspecified allergic rhinitis type  Chronic pain disorder - Plan: DULoxetine (CYMBALTA) 30 MG capsule  Fall precautions.    Avoid foods that make your symptoms worse, for example coffee, chocolate,pepermeint,alcohol, and greasy food. Raising the head of your bed about 6 inches may help with nocturnal symptoms.   Weight loss (if you are overweight). Avoid lying down for 3 hours after eating.  Instead 3 large meals daily try small and more frequent meals during the day.  Some medications we recommend for acid reflux treatment (proton pump inhibitors) can cause some problems in the long term: increase risk of osteoporosis, vitamin deficiencies,pneumonia, and more recently discovered that it can increase the risk of chronic kidney disease and might increase risk of dementia.     Please be sure medication list is accurate. If a new problem present, please set up appointment sooner than planned today.

## 2016-07-13 NOTE — Progress Notes (Signed)
Pre visit review using our clinic review tool, if applicable. No additional management support is needed unless otherwise documented below in the visit note. 

## 2016-07-16 ENCOUNTER — Encounter: Payer: Self-pay | Admitting: Family Medicine

## 2016-07-16 ENCOUNTER — Ambulatory Visit (INDEPENDENT_AMBULATORY_CARE_PROVIDER_SITE_OTHER): Payer: Medicare Other | Admitting: Family Medicine

## 2016-07-16 ENCOUNTER — Telehealth: Payer: Self-pay | Admitting: Family Medicine

## 2016-07-16 VITALS — BP 138/58 | HR 84 | Temp 98.1°F | Ht 59.0 in | Wt 141.6 lb

## 2016-07-16 DIAGNOSIS — T50905A Adverse effect of unspecified drugs, medicaments and biological substances, initial encounter: Secondary | ICD-10-CM

## 2016-07-16 DIAGNOSIS — T887XXA Unspecified adverse effect of drug or medicament, initial encounter: Secondary | ICD-10-CM

## 2016-07-16 NOTE — Telephone Encounter (Signed)
Pt is scheduled to see Dr. Maudie Mercury

## 2016-07-16 NOTE — Telephone Encounter (Signed)
Patient was seen in the office by Dr Maudie Mercury.

## 2016-07-16 NOTE — Progress Notes (Signed)
Pre visit review using our clinic review tool, if applicable. No additional management support is needed unless otherwise documented below in the visit note. 

## 2016-07-16 NOTE — Progress Notes (Signed)
  HPI:  Acute visit for medication side effect: -started cymbalta for chronic pain and has taken twice -feels it causes nausea and fatigue -reports took it in the past and did the same -she does not want to take it, read the side effects and really feels she is having these due to the medication -denies: fevers, vomiting, diarrhea, rash, abd pain, dysuria, SOB, swelling of face or throat, sinus congestion   ROS: See pertinent positives and negatives per HPI.  Past Medical History:  Diagnosis Date  . Allergy   . Arthritis   . GERD (gastroesophageal reflux disease)   . Hyperlipidemia     No past surgical history on file.  Family History  Problem Relation Age of Onset  . Stroke Father   . Hypertension Father   . Alcohol abuse Father     Social History   Social History  . Marital status: Married    Spouse name: N/A  . Number of children: N/A  . Years of education: N/A   Social History Main Topics  . Smoking status: Former Research scientist (life sciences)  . Smokeless tobacco: Never Used  . Alcohol use No  . Drug use: No  . Sexual activity: Not Asked   Other Topics Concern  . None   Social History Narrative  . None     Current Outpatient Prescriptions:  .  cetirizine (ZYRTEC) 10 MG tablet, Take 10 mg by mouth at bedtime., Disp: , Rfl:  .  DULoxetine (CYMBALTA) 30 MG capsule, Take 1 capsule (30 mg total) by mouth daily., Disp: 30 capsule, Rfl: 1 .  fluticasone (FLONASE) 50 MCG/ACT nasal spray, Place 1 spray into both nostrils daily., Disp: , Rfl:  .  montelukast (SINGULAIR) 10 MG tablet, Take 10 mg by mouth at bedtime., Disp: , Rfl:  .  omeprazole (PRILOSEC) 20 MG capsule, Take 20 mg by mouth daily., Disp: , Rfl:  .  pravastatin (PRAVACHOL) 20 MG tablet, Take 20 mg by mouth daily., Disp: , Rfl:  .  traMADol (ULTRAM) 50 MG tablet, Take 1 tablet (50 mg total) by mouth at bedtime., Disp: 30 tablet, Rfl: 1  EXAM:  Vitals:   07/16/16 1529  BP: (!) 138/58  Pulse: 84  Temp: 98.1 F (36.7  C)    Body mass index is 28.6 kg/m.  GENERAL: vitals reviewed and listed above, alert, oriented, appears well hydrated and in no acute distress  HEENT: atraumatic, conjunttiva clear, no obvious abnormalities on inspection of external nose and ears  NECK: no obvious masses on inspection  LUNGS: clear to auscultation bilaterally, no wheezes, rales or rhonchi, good air movement  CV: HRRR, no peripheral edema  MS: moves all extremities without noticeable abnormality  PSYCH: pleasant and cooperative, no obvious depression or anxiety  ASSESSMENT AND PLAN:  Discussed the following assessment and plan:  Medication side effect, initial encounter  -normal exam -opted to hold medication, advise to be on the look out for worsening symptoms or symptoms that do not improve and to seek care immediately if this occurs -o/w follow up with PCP as scheduled or sooner if needed -discussed various options for chronic pain -Patient advised to return or notify a doctor immediately if symptoms worsen or persist or new concerns arise.  Pt declined AVS.  There are no Patient Instructions on file for this visit.  Colin Benton R., DO

## 2016-07-16 NOTE — Telephone Encounter (Signed)
Patient Name: Tabitha Adams  DOB: 12/06/1945    Initial Comment Caller states having nausea, sweats, joints aching, dry heaving, sleepy, was just put on Cymbalta   Nurse Assessment  Nurse: Mallie Mussel, RN, Alveta Heimlich Date/Time Eilene Ghazi Time): 07/16/2016 11:47:11 AM  Confirm and document reason for call. If symptomatic, describe symptoms. You must click the next button to save text entered. ---Caller states that she has been on Cymbalta for 2 days. She has nausea, dry heaves, sweats, joint aches and sleepy. All of these are listed as side effects of Cymbalta on [employer].com's site. She has not vomited in the last 24 hours, but she is having the dry heaves. She rates her joint pain as 5 on 0-10 scale and its worse in her hips and back. She denies numbness in her legs, feet and toes. Denies fever. https://www.drugs.com/sfx/cymbalta-side-effects.html  Has the patient traveled out of the country within the last 30 days? ---No  Does the patient have any new or worsening symptoms? ---Yes  Will a triage be completed? ---Yes  Related visit to physician within the last 2 weeks? ---Yes  Does the PT have any chronic conditions? (i.e. diabetes, asthma, etc.) ---Yes  List chronic conditions. ---Arthritis  Is this a behavioral health or substance abuse call? ---No     Guidelines    Guideline Title Affirmed Question Affirmed Notes  Back Pain [1] MODERATE back pain (e.g., interferes with normal activities) AND [2] present > 3 days    Final Disposition User   See PCP When Office is Open (within 3 days) Mallie Mussel, RN, Alveta Heimlich    Comments  Dr. [name] is not in today. I scheduled her to be seen today at 3:30pm with Dr. Colin Benton.   Referrals  REFERRED TO PCP OFFICE   Disagree/Comply: Comply

## 2016-08-31 ENCOUNTER — Ambulatory Visit (INDEPENDENT_AMBULATORY_CARE_PROVIDER_SITE_OTHER): Payer: Medicare Other | Admitting: Family Medicine

## 2016-08-31 ENCOUNTER — Encounter: Payer: Self-pay | Admitting: Family Medicine

## 2016-08-31 VITALS — BP 130/76 | HR 83 | Resp 12 | Ht 59.0 in | Wt 145.2 lb

## 2016-08-31 DIAGNOSIS — K219 Gastro-esophageal reflux disease without esophagitis: Secondary | ICD-10-CM

## 2016-08-31 DIAGNOSIS — E785 Hyperlipidemia, unspecified: Secondary | ICD-10-CM

## 2016-08-31 DIAGNOSIS — M159 Polyosteoarthritis, unspecified: Secondary | ICD-10-CM | POA: Diagnosis not present

## 2016-08-31 DIAGNOSIS — Z23 Encounter for immunization: Secondary | ICD-10-CM

## 2016-08-31 DIAGNOSIS — J309 Allergic rhinitis, unspecified: Secondary | ICD-10-CM

## 2016-08-31 DIAGNOSIS — G894 Chronic pain syndrome: Secondary | ICD-10-CM

## 2016-08-31 MED ORDER — MONTELUKAST SODIUM 10 MG PO TABS
10.0000 mg | ORAL_TABLET | Freq: Every day | ORAL | 3 refills | Status: DC
Start: 2016-08-31 — End: 2017-03-09

## 2016-08-31 MED ORDER — OMEPRAZOLE 20 MG PO CPDR: 20.0000 mg | DELAYED_RELEASE_CAPSULE | Freq: Every day | ORAL | 3 refills | Status: DC

## 2016-08-31 MED ORDER — MELOXICAM 15 MG PO TABS: 15.0000 mg | ORAL_TABLET | Freq: Every day | ORAL | 2 refills | Status: DC

## 2016-08-31 MED ORDER — PRAVASTATIN SODIUM 20 MG PO TABS: 20.0000 mg | ORAL_TABLET | Freq: Every day | ORAL | 2 refills | Status: DC
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
## 2016-08-31 NOTE — Patient Instructions (Signed)
A few things to remember from today's visit:   Need for immunization against influenza - Plan: Flu vaccine HIGH DOSE PF  Hyperlipidemia, unspecified hyperlipidemia type - Plan: pravastatin (PRAVACHOL) 20 MG tablet  Gastroesophageal reflux disease without esophagitis - Plan: omeprazole (PRILOSEC) 20 MG capsule  Generalized osteoarthritis of multiple sites - Plan: meloxicam (MOBIC) 15 MG tablet  Chronic pain disorder - Plan: meloxicam (MOBIC) 15 MG tablet  Chronic allergic rhinitis, unspecified seasonality, unspecified trigger - Plan: montelukast (SINGULAIR) 10 MG tablet  Continue tramadol twice daily as needed. Try meloxicam and can also take acetaminophen 500 mg 3 times per day as needed  Low impact exercise: Yoga, tai chi, or aquatic exercises might also help.  Please be sure medication list is accurate. If a new problem present, please set up appointment sooner than planned today.

## 2016-09-24 ENCOUNTER — Encounter: Payer: Self-pay | Admitting: Adult Health

## 2016-09-24 ENCOUNTER — Ambulatory Visit (INDEPENDENT_AMBULATORY_CARE_PROVIDER_SITE_OTHER): Payer: Medicare Other | Admitting: Adult Health

## 2016-09-24 VITALS — BP 164/70 | Temp 98.0°F | Ht 59.0 in | Wt 144.3 lb

## 2016-09-24 DIAGNOSIS — H8113 Benign paroxysmal vertigo, bilateral: Secondary | ICD-10-CM

## 2016-09-24 DIAGNOSIS — R030 Elevated blood-pressure reading, without diagnosis of hypertension: Secondary | ICD-10-CM

## 2016-09-24 MED ORDER — MECLIZINE HCL 25 MG PO TABS: ORAL_TABLET | ORAL | 0 refills | Status: DC

## 2016-09-24 NOTE — Progress Notes (Signed)
Subjective:    Patient ID: Tabitha Adams, female    DOB: Feb 11, 1946, 70 y.o.   MRN: [id number]  HPI  70 year old female who  has a past medical history of Allergy; Arthritis; GERD (gastroesophageal reflux disease); and Hyperlipidemia. She presents to the office today with the complaint of worsening vertigo like symptoms x 2 weeks. She reports having vertigo about 5 years ago and this feels like that.   She was seen by Dr. [name] about a month ago and was taken off Cymbalta due to dizziness, she feels as though this is different. Her symptoms include feeling as though the room is spinning and is especially apparent when she changes directions ( supine to sitting and sitting to standing) or looks side to side.   She has had nausea but no vomiting  In regards to elevated blood pressure reading. She reports that " it goes up and then it goes down, it has always been all over the place."    Review of Systems  Constitutional: Positive for activity change. Negative for chills, diaphoresis, fatigue and fever.  HENT: Negative.   Respiratory: Negative.   Cardiovascular: Negative.   Genitourinary: Negative.   Musculoskeletal: Negative.   Neurological: Positive for dizziness and light-headedness. Negative for tremors, syncope, facial asymmetry, weakness and headaches.  Hematological: Negative.   Psychiatric/Behavioral: Negative.   All other systems reviewed and are negative.  Past Medical History:  Diagnosis Date  . Allergy   . Arthritis   . GERD (gastroesophageal reflux disease)   . Hyperlipidemia     Social History   Social History  . Marital status: Married    Spouse name: N/A  . Number of children: N/A  . Years of education: N/A   Occupational History  . Not on file.   Social History Main Topics  . Smoking status: Former Research scientist (life sciences)  . Smokeless tobacco: Never Used  . Alcohol use No  . Drug use: No  . Sexual activity: Not on file   Other Topics Concern  . Not on file    Social History Narrative  . No narrative on file    No past surgical history on file.  Family History  Problem Relation Age of Onset  . Stroke Father   . Hypertension Father   . Alcohol abuse Father     Allergies  Allergen Reactions  . Penicillins   . Sulfur     Current Outpatient Prescriptions on File Prior to Visit  Medication Sig Dispense Refill  . cetirizine (ZYRTEC) 10 MG tablet Take 10 mg by mouth at bedtime.    . fluticasone (FLONASE) 50 MCG/ACT nasal spray Place 1 spray into both nostrils daily.    . meloxicam (MOBIC) 15 MG tablet Take 1 tablet (15 mg total) by mouth daily. 30 tablet 2  . montelukast (SINGULAIR) 10 MG tablet Take 1 tablet (10 mg total) by mouth at bedtime. 90 tablet 3  . omeprazole (PRILOSEC) 20 MG capsule Take 1 capsule (20 mg total) by mouth daily. 90 capsule 3  . pravastatin (PRAVACHOL) 20 MG tablet Take 1 tablet (20 mg total) by mouth daily. 90 tablet 2  . traMADol (ULTRAM) 50 MG tablet Take 1 tablet (50 mg total) by mouth at bedtime. 30 tablet 1   No current facility-administered medications on file prior to visit.     BP (!) 164/70   Temp 98 F (36.7 C) (Oral)   Ht 4\' 11"  (1.499 m)   Wt 144 lb 4.8 oz (  65.5 kg)   BMI 29.15 kg/m       Objective:   Physical Exam  Constitutional: She is oriented to person, place, and time. She appears well-developed and well-nourished. No distress.  HENT:  Head: Normocephalic and atraumatic.  Right Ear: External ear normal.  Left Ear: External ear normal.  Nose: Nose normal.  Mouth/Throat: Oropharynx is clear and moist. No oropharyngeal exudate.  Eyes: Conjunctivae are normal. Pupils are equal, round, and reactive to light. Left eye exhibits no discharge. Right eye exhibits nystagmus (horizontal ). Left eye exhibits nystagmus (horizontal ).  Cardiovascular: Normal rate, regular rhythm, normal heart sounds and intact distal pulses.  Exam reveals no gallop.   No murmur heard. Pulmonary/Chest: Effort  normal and breath sounds normal. No respiratory distress. She has no wheezes. She has no rales. She exhibits no tenderness.  Neurological: She is alert and oriented to person, place, and time.  Skin: Skin is warm and dry. No rash noted. She is not diaphoretic. No erythema. No pallor.  Psychiatric: She has a normal mood and affect. Her behavior is normal. Judgment and thought content normal.  Nursing note and vitals reviewed.     Assessment & Plan:  1. Benign paroxysmal positional vertigo due to bilateral vestibular disorder - Home eply maneuvers given  - meclizine (ANTIVERT) 25 MG tablet; Take one half to one tablet three times a day for dizziness  Dispense: 30 tablet; Refill: 0 - Consider Vestibular PT if no improvement - Follow up with PCP as needed 2. Elevated blood pressure reading - Follow up with Dr. [name] in one week   Tabitha Peng, NP

## 2016-09-24 NOTE — Patient Instructions (Addendum)
It was great meeting you but I am sorry you are feeling this way  Your exam is consistent with Vertigo.   I have sent in a prescription for Meclizine. Take 1/2 to 1 tablet three times a day for dizziness  Please do the home exercises.   Follow up with Dr. [name] in one week for this as well as your blood pressure.    Benign Positional Vertigo Vertigo is the feeling that you or your surroundings are moving when they are not. Benign positional vertigo is the most common form of vertigo. The cause of this condition is not serious (is benign). This condition is triggered by certain movements and positions (is positional). This condition can be dangerous if it occurs while you are doing something that could endanger you or others, such as driving.  CAUSES In many cases, the cause of this condition is not known. It may be caused by a disturbance in an area of the inner ear that helps your brain to sense movement and balance. This disturbance can be caused by a viral infection (labyrinthitis), head injury, or repetitive motion. RISK FACTORS This condition is more likely to develop in:  Women.  People who are 70 years of age or older. SYMPTOMS Symptoms of this condition usually happen when you move your head or your eyes in different directions. Symptoms may start suddenly, and they usually last for less than a minute. Symptoms may include:  Loss of balance and falling.  Feeling like you are spinning or moving.  Feeling like your surroundings are spinning or moving.  Nausea and vomiting.  Blurred vision.  Dizziness.  Involuntary eye movement (nystagmus). Symptoms can be mild and cause only slight annoyance, or they can be severe and interfere with daily life. Episodes of benign positional vertigo may return (recur) over time, and they may be triggered by certain movements. Symptoms may improve over time. DIAGNOSIS This condition is usually diagnosed by medical history and a physical  exam of the head, neck, and ears. You may be referred to a health care provider who specializes in ear, nose, and throat (ENT) problems (otolaryngologist) or a provider who specializes in disorders of the nervous system (neurologist). You may have additional testing, including:  MRI.  A CT scan.  Eye movement tests. Your health care provider may ask you to change positions quickly while he or she watches you for symptoms of benign positional vertigo, such as nystagmus. Eye movement may be tested with an electronystagmogram (ENG), caloric stimulation, the Dix-Hallpike test, or the roll test.  An electroencephalogram (EEG). This records electrical activity in your brain.  Hearing tests. TREATMENT Usually, your health care provider will treat this by moving your head in specific positions to adjust your inner ear back to normal. Surgery may be needed in severe cases, but this is rare. In some cases, benign positional vertigo may resolve on its own in 2-4 weeks. HOME CARE INSTRUCTIONS Safety  Move slowly.Avoid sudden body or head movements.  Avoid driving.  Avoid operating heavy machinery.  Avoid doing any tasks that would be dangerous to you or others if a vertigo episode would occur.  If you have trouble walking or keeping your balance, try using a cane for stability. If you feel dizzy or unstable, sit down right away.  Return to your normal activities as told by your health care provider. Ask your health care provider what activities are safe for you. General Instructions  Take over-the-counter and prescription medicines only as told  by your health care provider.  Avoid certain positions or movements as told by your health care provider.  Drink enough fluid to keep your urine clear or pale yellow.  Keep all follow-up visits as told by your health care provider. This is important. SEEK MEDICAL CARE IF:  You have a fever.  Your condition gets worse or you develop new  symptoms.  Your family or friends notice any behavioral changes.  Your nausea or vomiting gets worse.  You have numbness or a "pins and needles" sensation. SEEK IMMEDIATE MEDICAL CARE IF:  You have difficulty speaking or moving.  You are always dizzy.  You faint.  You develop severe headaches.  You have weakness in your legs or arms.  You have changes in your hearing or vision.  You develop a stiff neck.  You develop sensitivity to light.   This information is not intended to replace advice given to you by your health care provider. Make sure you discuss any questions you have with your health care provider.   Document Released: 08/09/2006 Document Revised: 07/23/2015 Document Reviewed: 02/24/2015 Elsevier Interactive Patient Education [employer].

## 2016-10-04 ENCOUNTER — Ambulatory Visit (INDEPENDENT_AMBULATORY_CARE_PROVIDER_SITE_OTHER): Payer: Medicare Other | Admitting: Family Medicine

## 2016-10-04 ENCOUNTER — Encounter: Payer: Self-pay | Admitting: Family Medicine

## 2016-10-04 VITALS — BP 170/80 | HR 86 | Temp 97.7°F | Resp 12 | Ht 59.0 in | Wt 144.0 lb

## 2016-10-04 DIAGNOSIS — I10 Essential (primary) hypertension: Secondary | ICD-10-CM | POA: Insufficient documentation

## 2016-10-04 DIAGNOSIS — H8110 Benign paroxysmal vertigo, unspecified ear: Secondary | ICD-10-CM

## 2016-10-04 MED ORDER — BENAZEPRIL HCL 10 MG PO TABS: 10.0000 mg | ORAL_TABLET | Freq: Every day | ORAL | 1 refills | Status: DC

## 2016-10-04 NOTE — Progress Notes (Signed)
Pre visit review using our clinic review tool, if applicable. No additional management support is needed unless otherwise documented below in the visit note. 

## 2016-10-04 NOTE — Progress Notes (Signed)
HPI:   [name] is a 70 y.o. female, who is here today with her husband to follow on recent acute visit.  She was seen on 09/24/16 because vertigo, she has prior Hx, took Meclizine until 4 days ago, dizziness resolved. She denies hearing loss or tinnitus.   Concerns today: elevated BP.  Her BP has been elevated in the past few days. Headache ,mild frontal and cervical dull like pain. Last night BP at home: 180/87  Denies visual changes, chest pain, dyspnea, palpitation, claudication, focal weakness, or edema.  According to patient, in the past she has had elevated BP intermittently had former PCP didn't seem concerned about it.  Last OV, 08/31/2016, Mobic was added for treatment of generalized osteoarthritis.   Review of Systems  Constitutional: Negative for activity change, appetite change, fatigue, fever and unexpected weight change.  HENT: Negative for mouth sores, nosebleeds and trouble swallowing.   Eyes: Negative for redness and visual disturbance.  Respiratory: Negative for cough, shortness of breath and wheezing.   Cardiovascular: Negative for chest pain, palpitations and leg swelling.  Gastrointestinal: Negative for abdominal pain, nausea and vomiting.       Negative for changes in bowel habits.  Genitourinary: Negative for decreased urine volume, difficulty urinating and hematuria.  Neurological: Positive for headaches. Negative for syncope and weakness.  Psychiatric/Behavioral: Negative.       Current Outpatient Prescriptions on File Prior to Visit  Medication Sig Dispense Refill  . cetirizine (ZYRTEC) 10 MG tablet Take 10 mg by mouth at bedtime.    . fluticasone (FLONASE) 50 MCG/ACT nasal spray Place 1 spray into both nostrils daily.    . meclizine (ANTIVERT) 25 MG tablet Take one half to one tablet three times a day for dizziness 30 tablet 0  . meloxicam (MOBIC) 15 MG tablet Take 1 tablet (15 mg total) by mouth daily. 30 tablet 2  .  montelukast (SINGULAIR) 10 MG tablet Take 1 tablet (10 mg total) by mouth at bedtime. 90 tablet 3  . omeprazole (PRILOSEC) 20 MG capsule Take 1 capsule (20 mg total) by mouth daily. 90 capsule 3  . pravastatin (PRAVACHOL) 20 MG tablet Take 1 tablet (20 mg total) by mouth daily. 90 tablet 2  . traMADol (ULTRAM) 50 MG tablet Take 1 tablet (50 mg total) by mouth at bedtime. 30 tablet 1   No current facility-administered medications on file prior to visit.      Past Medical History:  Diagnosis Date  . Allergy   . Arthritis   . GERD (gastroesophageal reflux disease)   . Hyperlipidemia    Allergies  Allergen Reactions  . Penicillins   . Sulfur     Social History   Social History  . Marital status: Married    Spouse name: N/A  . Number of children: N/A  . Years of education: N/A   Social History Main Topics  . Smoking status: Former Research scientist (life sciences)  . Smokeless tobacco: Never Used  . Alcohol use No  . Drug use: No  . Sexual activity: Not Asked   Other Topics Concern  . None   Social History Narrative  . None    Vitals:   10/04/16 0823  BP: (!) 170/80  Pulse: 86  Resp: 12  Temp: 97.7 F (36.5 C)   Body mass index is 29.08 kg/m.    Physical Exam  Nursing note and vitals reviewed. Constitutional: She is oriented to person, place, and time. She appears well-developed. No  distress.  HENT:  Head: Atraumatic.  Mouth/Throat: Oropharynx is clear and moist and mucous membranes are normal.  Eyes: Conjunctivae and EOM are normal. Pupils are equal, round, and reactive to light.  Cardiovascular: Normal rate and regular rhythm.   No murmur heard. Pulses:      Dorsalis pedis pulses are 2+ on the right side, and 2+ on the left side.  Respiratory: Effort normal and breath sounds normal. No respiratory distress.  GI: Soft. She exhibits no mass. There is no hepatomegaly. There is no tenderness.  Musculoskeletal: She exhibits edema (Trace pitting edema LE bilateral.). She exhibits no  tenderness.  Neurological: She is alert and oriented to person, place, and time. She has normal strength. No cranial nerve deficit. Coordination normal.  Stable gait with no assistance, mildly antalgic  Skin: Skin is warm. No erythema.  Psychiatric: She has a normal mood and affect.  Well groomed, good eye contact.      ASSESSMENT AND PLAN:     [name] was seen today for follow-up.  Diagnoses and all orders for this visit:  Benign paroxysmal positional vertigo, unspecified laterality  Improved. Vestibular maneuvers at home recommended for future episodes, modified Semont maneuvers. Some side effects of medication discussed. Fall precautions recommended. Instructed about warning signs and follow up as needed.  Essential hypertension, benign  Rechecked BP: 180/85. After discussion of treatment options, we decided to try Benazepril, some side effects discussed. She takes OTC potassium supplementation, so a HCTZ was not recommended. Amlodipine next office visit if BP is not at goal. Recommended monitoring BP at home. Instructed about warning signs. Possible complications of elevated BP were discussed. Periodic eye examination and low salt diet/Dash diet recommended.  Follow up in 6 weeks, BMP in 7 days.  For now no changes in Meloxicam, some CV side effects of NSAIDs were discussed.  -     benazepril (LOTENSIN) 10 MG tablet; Take 1 tablet (10 mg total) by mouth daily. -     Basic metabolic panel; Future      -[name] was advised to return sooner than planned today if new concerns arise.       Koy Lamp G. [name], MD  [facility]. [place] office.

## 2016-10-04 NOTE — Patient Instructions (Addendum)
A few things to remember from today's visit:   Benign paroxysmal positional vertigo, unspecified laterality  Essential hypertension, benign - Plan: benazepril (LOTENSIN) 10 MG tablet, Basic metabolic panel Fall precautions.   Dizziness is a perception of movement, it is sometimes difficult to describe and can be  caused by different problems, most benign but others can be life threaten.  Vertigo is the most common cause of dizziness, usually related with inner ear and can be associated with nausea, vomiting, and unbalance sensation. It can be complicated by falls due to lose of balance; so fall precautions are very important.  Most of the time dizziness is benign, usually intermittent, last a few seconds at the time and aggravated by certain positions. It usually resolves in a few weeks without residual effect but it could be recurrent.  Sometimes blood work is ordered to evaluate for other possible causes.  Dizziness can also be caused by certain medications, dehydration, migraines, and strokes.  Medication prescribed for vertigo, Meclizine, causes drowsiness/sleepiness, so frequently I recommended taking it at bedtime. I also recommend what we called vestibular exercise, sometimes can be done at home (Modified Semont maneuvers) other times I refer patients to vestibular rehabilitation.   Seek immediate medical attention if: New severe headache, dobble vision, fever (100 F or more), associated numbness/tingling, focal weakness, persistent vomiting, not able to walk, or sudden worsening symptoms.   Blood pressure goal for most people is less than 140/90.Some populations (older than 60) the goal is less than 150/90.  Elevated blood pressure increases the risk of strokes, heart and kidney disease, and eye problems. Regular physical activity and a healthy diet (DASH diet) usually help. Low salt diet. Take medications as instructed. Caution with some over the counter medications as cold  medications, dietary products (for weight loss), and Ibuprofen or Aleve (frequent use);all these medications could cause elevation of blood pressure.  Lab in 7 days. Monitor blood pressure.   Please be sure medication list is accurate. If a new problem present, please set up appointment sooner than planned today.

## 2016-10-11 ENCOUNTER — Encounter: Payer: Self-pay | Admitting: Family Medicine

## 2016-10-11 ENCOUNTER — Other Ambulatory Visit (INDEPENDENT_AMBULATORY_CARE_PROVIDER_SITE_OTHER): Payer: Medicare Other

## 2016-10-11 DIAGNOSIS — I10 Essential (primary) hypertension: Secondary | ICD-10-CM

## 2016-10-11 LAB — BASIC METABOLIC PANEL
BUN: 14 mg/dL (ref 6–23)
CALCIUM: 10.1 mg/dL (ref 8.4–10.5)
CO2: 31 meq/L (ref 19–32)
Chloride: 101 mEq/L (ref 96–112)
Creatinine, Ser: 0.76 mg/dL (ref 0.40–1.20)
GFR: 79.81 mL/min (ref >60.00)
GLUCOSE: 90 mg/dL (ref 70–99)
POTASSIUM: 4.1 meq/L (ref 3.5–5.1)
SODIUM: 140 meq/L (ref 135–145)

## 2016-10-28 ENCOUNTER — Encounter: Payer: Self-pay | Admitting: Vascular Surgery

## 2016-11-04 ENCOUNTER — Encounter: Payer: Medicare Other | Admitting: Vascular Surgery

## 2016-11-04 ENCOUNTER — Ambulatory Visit (HOSPITAL_COMMUNITY): Payer: Medicare Other

## 2016-11-04 ENCOUNTER — Encounter (HOSPITAL_COMMUNITY): Payer: Medicare Other

## 2016-11-10 ENCOUNTER — Encounter: Payer: Self-pay | Admitting: Family Medicine

## 2016-11-10 ENCOUNTER — Ambulatory Visit (INDEPENDENT_AMBULATORY_CARE_PROVIDER_SITE_OTHER): Payer: Medicare Other | Admitting: Family Medicine

## 2016-11-10 DIAGNOSIS — I10 Essential (primary) hypertension: Secondary | ICD-10-CM | POA: Diagnosis not present

## 2016-11-10 MED ORDER — BENAZEPRIL HCL 20 MG PO TABS: 20.0000 mg | ORAL_TABLET | Freq: Every day | ORAL | 1 refills | Status: DC

## 2016-11-10 NOTE — Patient Instructions (Addendum)
A few things to remember from today's visit:   Essential hypertension, benign - Plan: benazepril (LOTENSIN) 20 MG tablet  Blood pressure goal for most people is less than 140/90. Today 140/75.  Benazepril increased to 20 mg.  Elevated blood pressure increases the risk of strokes, heart and kidney disease, and eye problems. Regular physical activity and a healthy diet (DASH diet) usually help. Low salt diet. Take medications as instructed. Caution with some over the counter medications as cold medications, dietary products (for weight loss), and Ibuprofen or Aleve (frequent use);all these medications could cause elevation of blood pressure.   Please be sure medication list is accurate. If a new problem present, please set up appointment sooner than planned today.

## 2016-11-10 NOTE — Progress Notes (Signed)
Tabitha Adams is a 70 y.o.female, who is here today to follow on HTN.  Currently she is on Benazepril 10 mg daily, started during last OV (10/04/16). She is taking medications as instructed, no side effects reported.  She has not noted unusual headache, visual changes, exertional chest pain, dyspnea,  focal weakness, or edema.    Lab Results  Component Value Date   CREATININE 0.76 10/11/2016   BUN 14 10/11/2016   NA 140 10/11/2016   K 4.1 10/11/2016   CL 101 10/11/2016   CO2 31 10/11/2016    BP 150-170's/80-90's, usually she checks BP at night. States that BP area greatly improved compared with BP readings before starting medication, SBP's 200's.  She attributes elevated BP some stress, which is not more than usual.     Review of Systems  Constitutional: Negative for activity change, appetite change and fatigue.  HENT: Negative for nosebleeds and trouble swallowing.   Eyes: Negative for pain and visual disturbance.  Respiratory: Negative for cough, shortness of breath and wheezing.   Cardiovascular: Negative for chest pain, palpitations and leg swelling.  Gastrointestinal: Negative for abdominal pain, nausea and vomiting.       Negative for changes in bowel habits.  Genitourinary: Negative for decreased urine volume and hematuria.  Neurological: Negative for syncope, weakness and headaches.  Psychiatric/Behavioral: Negative for confusion. The patient is not nervous/anxious.      Current Outpatient Prescriptions on File Prior to Visit  Medication Sig Dispense Refill  . cetirizine (ZYRTEC) 10 MG tablet Take 10 mg by mouth at bedtime.    . fluticasone (FLONASE) 50 MCG/ACT nasal spray Place 1 spray into both nostrils daily.    . meclizine (ANTIVERT) 25 MG tablet Take one half to one tablet three times a day for dizziness (Patient taking differently: Take 25 mg by mouth 3 (three) times daily as needed. Take one half to one tablet three times a day for dizziness)  30 tablet 0  . meloxicam (MOBIC) 15 MG tablet Take 1 tablet (15 mg total) by mouth daily. 30 tablet 2  . montelukast (SINGULAIR) 10 MG tablet Take 1 tablet (10 mg total) by mouth at bedtime. 90 tablet 3  . omeprazole (PRILOSEC) 20 MG capsule Take 1 capsule (20 mg total) by mouth daily. 90 capsule 3  . pravastatin (PRAVACHOL) 20 MG tablet Take 1 tablet (20 mg total) by mouth daily. 90 tablet 2  . traMADol (ULTRAM) 50 MG tablet Take 1 tablet (50 mg total) by mouth at bedtime. 30 tablet 1   No current facility-administered medications on file prior to visit.      Past Medical History:  Diagnosis Date  . Allergy   . Arthritis   . GERD (gastroesophageal reflux disease)   . Hyperlipidemia     Allergies  Allergen Reactions  . Penicillins   . Sulfur     Social History   Social History  . Marital status: Married    Spouse name: N/A  . Number of children: N/A  . Years of education: N/A   Social History Main Topics  . Smoking status: Former Research scientist (life sciences)  . Smokeless tobacco: Never Used  . Alcohol use No  . Drug use: No  . Sexual activity: Not Asked   Other Topics Concern  . None   Social History Narrative  . None    Vitals:   11/10/16 0821  BP: 130/70  Pulse: 85  Resp: 12   Body mass index is 28.53  kg/m.    Physical Exam  Nursing note and vitals reviewed. Constitutional: She is oriented to person, place, and time. She appears well-developed. No distress.  HENT:  Head: Atraumatic.  Mouth/Throat: Oropharynx is clear and moist and mucous membranes are normal.  Eyes: Conjunctivae and EOM are normal.  Cardiovascular: Normal rate and regular rhythm.   No murmur heard. Pulses:      Dorsalis pedis pulses are 2+ on the right side, and 2+ on the left side.  Respiratory: Effort normal and breath sounds normal. No respiratory distress.  Musculoskeletal: She exhibits no edema.  Neurological: She is alert and oriented to person, place, and time. She has normal strength.  Coordination normal.  Skin: Skin is warm. No erythema.  Psychiatric: She has a normal mood and affect.  Well groomed, good eye contact.    ASSESSMENT AND PLAN:   [place] was seen today for follow-up.  Diagnoses and all orders for this visit:  Essential hypertension, benign -     benazepril (LOTENSIN) 20 MG tablet; Take 1 tablet (20 mg total) by mouth daily.  BP's she is reporting are still elevated. BP re-checked 140/75. Benazepril increased from 10 mg to 20 mg. Monitor BP at home, alternate am and pm, bring BP monitor to BP check visit. Possible complications of elevated BP discussed.  BP check in 4-5 weeks and f/u in 3-4 months.     -[name] was advised to return sooner than planned today if new concerns arise.     [name]. [name], MD  [facility]. [place] office.

## 2016-11-13 ENCOUNTER — Ambulatory Visit (INDEPENDENT_AMBULATORY_CARE_PROVIDER_SITE_OTHER): Payer: Medicare Other | Admitting: Family Medicine

## 2016-11-13 ENCOUNTER — Encounter: Payer: Self-pay | Admitting: Family Medicine

## 2016-11-13 VITALS — BP 126/64 | HR 80 | Temp 98.3°F | Resp 16 | Wt 140.0 lb

## 2016-11-13 DIAGNOSIS — J019 Acute sinusitis, unspecified: Secondary | ICD-10-CM | POA: Diagnosis not present

## 2016-11-13 MED ORDER — AZITHROMYCIN 250 MG PO TABS
ORAL_TABLET | ORAL | 0 refills | Status: DC
Start: 2016-11-13 — End: 2016-12-10

## 2016-11-13 NOTE — Progress Notes (Signed)
   Subjective:    Patient ID: Tabitha Adams, female    DOB: September 14, 1946, 70 y.o.   MRN: [id number]  HPI Here for 4 days of sinus pressure, PND, and a dry cough. No fever.    Review of Systems  Constitutional: Negative.   HENT: Positive for congestion, sinus pain and sinus pressure. Negative for ear pain and sore throat.   Eyes: Negative.   Respiratory: Positive for cough.        Objective:   Physical Exam  Constitutional: She appears well-developed and well-nourished.  HENT:  Right Ear: External ear normal.  Left Ear: External ear normal.  Nose: Nose normal.  Mouth/Throat: Oropharynx is clear and moist.  Eyes: Conjunctivae are normal.  Neck: No thyromegaly present.  Pulmonary/Chest: Effort normal and breath sounds normal.  Lymphadenopathy:    She has no cervical adenopathy.          Assessment & Plan:  Sinusitis, treat with a Zpack. Add Delsym prn.  Alysia Penna, MD

## 2016-11-13 NOTE — Progress Notes (Signed)
Pre visit review using our clinic review tool, if applicable. No additional management support is needed unless otherwise documented below in the visit note. 

## 2016-11-25 ENCOUNTER — Other Ambulatory Visit: Payer: Self-pay | Admitting: Family Medicine

## 2016-11-25 DIAGNOSIS — M159 Polyosteoarthritis, unspecified: Secondary | ICD-10-CM

## 2016-11-25 DIAGNOSIS — G894 Chronic pain syndrome: Secondary | ICD-10-CM

## 2016-11-26 ENCOUNTER — Other Ambulatory Visit: Payer: Self-pay | Admitting: Family Medicine

## 2016-11-26 ENCOUNTER — Other Ambulatory Visit: Payer: Self-pay | Admitting: *Deleted

## 2016-11-26 DIAGNOSIS — I10 Essential (primary) hypertension: Secondary | ICD-10-CM

## 2016-11-26 DIAGNOSIS — I83813 Varicose veins of bilateral lower extremities with pain: Secondary | ICD-10-CM

## 2016-11-30 ENCOUNTER — Encounter: Payer: Self-pay | Admitting: Vascular Surgery

## 2016-12-02 ENCOUNTER — Ambulatory Visit (INDEPENDENT_AMBULATORY_CARE_PROVIDER_SITE_OTHER): Payer: Medicare Other | Admitting: Vascular Surgery

## 2016-12-02 ENCOUNTER — Ambulatory Visit (HOSPITAL_COMMUNITY)
Admission: RE | Admit: 2016-12-02 | Discharge: 2016-12-02 | Disposition: A | Discharge status: Home / Self Care | Payer: Medicare Other | Source: Ambulatory Visit | Attending: Vascular Surgery | Admitting: Vascular Surgery

## 2016-12-02 ENCOUNTER — Encounter: Payer: Self-pay | Admitting: Vascular Surgery

## 2016-12-02 VITALS — BP 135/79 | HR 75 | Temp 97.8°F | Resp 18 | Ht 59.0 in | Wt 142.0 lb

## 2016-12-02 DIAGNOSIS — I83813 Varicose veins of bilateral lower extremities with pain: Secondary | ICD-10-CM | POA: Insufficient documentation

## 2016-12-02 NOTE — Progress Notes (Signed)
Patient name: Tabitha Adams MRN: [id number] DOB: 18-Oct-1946 Sex: female  REASON FOR CONSULT: Bilateral varicose veins with pain  HPI: Tabitha Adams is a 71 y.o. female, who has had varicose veins for several years. Over the last year or so. She has developed progressive symptoms of running especially over the medial knee in both legs. She also has some achiness in her lower extremities. This occurs after standing for long periods of time. She denies any prior history of DVT. She denies any swelling. She has several clusters over the medial portion of her leg that become easily irritated when wearing clothing. She has a family history of varicose veins in her mother and sister. She has not previously worn compression stockings. She denies any prior nonhealing wounds. She is of the opinion that her quality of life is significantly diminished by the fact that she can't stand for long periods of time without her legs causing significant pain. Other medical problems include anemia, arthritis, hyperlipidemia, reflux all of which are currently stable.  Past Medical History:  Diagnosis Date  . Allergy   . Anemia   . Arthritis   . GERD (gastroesophageal reflux disease)   . Hyperlipidemia    History reviewed. No pertinent surgical history.  Family History  Problem Relation Age of Onset  . Stroke Father   . Hypertension Father   . Alcohol abuse Father   Varicose veins  SOCIAL HISTORY: Social History   Social History  . Marital status: Married    Spouse name: N/A  . Number of children: N/A  . Years of education: N/A   Occupational History  . Not on file.   Social History Main Topics  . Smoking status: Former Research scientist (life sciences)  . Smokeless tobacco: Never Used  . Alcohol use No  . Drug use: No  . Sexual activity: Not on file   Other Topics Concern  . Not on file   Social History Narrative  . No narrative on file    Allergies  Allergen Reactions  . Penicillins   . Sulfur      Current Outpatient Prescriptions  Medication Sig Dispense Refill  . azithromycin (ZITHROMAX) 250 MG tablet As directed 6 tablet 0  . benazepril (LOTENSIN) 10 MG tablet TAKE 1 TABLET (10 MG TOTAL) BY MOUTH DAILY. 30 tablet 5  . benazepril (LOTENSIN) 20 MG tablet Take 1 tablet (20 mg total) by mouth daily. 90 tablet 1  . cetirizine (ZYRTEC) 10 MG tablet Take 10 mg by mouth at bedtime.    . fluticasone (FLONASE) 50 MCG/ACT nasal spray Place 1 spray into both nostrils daily.    . meclizine (ANTIVERT) 25 MG tablet Take one half to one tablet three times a day for dizziness (Patient taking differently: Take 25 mg by mouth 3 (three) times daily as needed. Take one half to one tablet three times a day for dizziness) 30 tablet 0  . meloxicam (MOBIC) 15 MG tablet TAKE 1 TABLET (15 MG TOTAL) BY MOUTH DAILY. 30 tablet 1  . montelukast (SINGULAIR) 10 MG tablet Take 1 tablet (10 mg total) by mouth at bedtime. 90 tablet 3  . omeprazole (PRILOSEC) 20 MG capsule Take 1 capsule (20 mg total) by mouth daily. 90 capsule 3  . pravastatin (PRAVACHOL) 20 MG tablet Take 1 tablet (20 mg total) by mouth daily. 90 tablet 2  . traMADol (ULTRAM) 50 MG tablet Take 1 tablet (50 mg total) by mouth at bedtime. 30 tablet 1   No current  facility-administered medications for this visit.     ROS:   General:  No weight loss, Fever, chills  HEENT: No recent headaches, no nasal bleeding, no visual changes, no sore throat  Neurologic: No dizziness, blackouts, seizures. No recent symptoms of stroke or mini- stroke. No recent episodes of slurred speech, or temporary blindness.  Cardiac: No recent episodes of chest pain/pressure, no shortness of breath at rest.  No shortness of breath with exertion.  Denies history of atrial fibrillation or irregular heartbeat  Vascular: No history of rest pain in feet.  No history of claudication.  No history of non-healing ulcer, No history of DVT   Pulmonary: No home oxygen, no productive  cough, no hemoptysis,  No asthma or wheezing  Musculoskeletal:  [X]  Arthritis, [ ]  Low back pain,  [X]  Joint pain  Hematologic:No history of hypercoagulable state.  No history of easy bleeding.  No history of anemia  Gastrointestinal: No hematochezia or melena,  No gastroesophageal reflux, no trouble swallowing  Urinary: [ ]  chronic Kidney disease, [ ]  on HD - [ ]  MWF or [ ]  TTHS, [ ]  Burning with urination, [ ]  Frequent urination, [ ]  Difficulty urinating;   Skin: No rashes  Psychological: No history of anxiety,  No history of depression   Physical Examination  Vitals:   12/02/16 1400  BP: 135/79  Pulse: 75  Resp: 18  Temp: 97.8 F (36.6 C)  TempSrc: Oral  SpO2: 98%  Weight: 142 lb (64.4 kg)  Height: 4\' 11"  (1.499 m)    Body mass index is 28.68 kg/m.  General:  Alert and oriented, no acute distress HEENT: Normal Neck: No bruit or JVD Pulmonary: Clear to auscultation bilaterally Cardiac: Regular Rate and Rhythm without murmur Abdomen: Soft, non-tender, non-distended, no mass, no scars Skin: No rash, diffuse scattered reticular type varicosities on the anterior thigh bilaterally. She has a large cluster of reticular's on the right medial knee which she states becomes quite irritated wearing clothing. She has an area of clusters of varicosities over the medial mid calf each Is very similar in appearance covering about an 8 x 8 cm area.  The diameter of these clusters is about 4 mm. Extremity Pulses:  2+ radial, brachial, femoral, dorsalis pedis, posterior tibial pulses bilaterally Musculoskeletal: No deformity or edema  Neurologic: Upper and lower extremity motor 5/5 and symmetric  DATA:  Patient had a bilateral lower extremity reflux exam today. She had reflux in the deep system primarily common femoral vein. She had no evidence of DVT. She had proximal reflux in the right greater saphenous with vein diameter 2.5-3.7 mm. She also had reflux in the lesser saphenous 2.3-2.6  mm diameter.  In the left lower extremity, she had reflux diffusely in the greater saphenous vein 3-4 mm diameter up to 8 mm of the saphenofemoral junction. She also had reflux in the lesser saphenous 2-3 mm diameter.  ASSESSMENT:  Bilateral symptomatic varicose veins with pain.  CEAP class 2   PLAN:  Patient was given a prescription today for bilateral thigh-high compression stockings 20-30 mm. She will follow-up in 3 months time for consideration of laser ablation possible sclerotherapy as well.   Ruta Hinds, MD [facility] Office: [phone] Pager: [phone]

## 2016-12-02 NOTE — Addendum Note (Signed)
Addended by: Elam Dutch on: 12/02/2016 02:30 PM   Modules accepted: Level of Service

## 2016-12-10 ENCOUNTER — Encounter: Payer: Self-pay | Admitting: Family Medicine

## 2016-12-10 ENCOUNTER — Ambulatory Visit (INDEPENDENT_AMBULATORY_CARE_PROVIDER_SITE_OTHER): Payer: Medicare Other | Admitting: Family Medicine

## 2016-12-10 VITALS — BP 136/60 | HR 86 | Temp 98.5°F | Resp 12 | Ht 59.0 in | Wt 142.1 lb

## 2016-12-10 DIAGNOSIS — J309 Allergic rhinitis, unspecified: Secondary | ICD-10-CM | POA: Diagnosis not present

## 2016-12-10 DIAGNOSIS — J069 Acute upper respiratory infection, unspecified: Secondary | ICD-10-CM

## 2016-12-10 DIAGNOSIS — I10 Essential (primary) hypertension: Secondary | ICD-10-CM

## 2016-12-10 MED ORDER — FLUTICASONE PROPIONATE 50 MCG/ACT NA SUSP: 1.0000 | Freq: Two times a day (BID) | NASAL | 3 refills | Status: DC | PRN

## 2016-12-10 MED ORDER — BENZONATATE 100 MG PO CAPS: 200.0000 mg | ORAL_CAPSULE | Freq: Two times a day (BID) | ORAL | 0 refills | Status: AC | PRN

## 2016-12-10 MED ORDER — BENAZEPRIL HCL 20 MG PO TABS: 20.0000 mg | ORAL_TABLET | Freq: Every day | ORAL | 1 refills | Status: DC

## 2016-12-10 NOTE — Progress Notes (Signed)
HPI:  ACUTE VISIT:  Chief Complaint  Patient presents with  . Cough    [name] is a 71 y.o. female, who is here today with her husband complaining of 2 days of respiratory symptoms.  Mostly non productive cough, occasionally she coughs up clear "mucus."  She has not noted fever or chill. "Little" body aches. + Mild nasal congestion, rhinorrhea, sore throat, and post nasal drainage.  Denies chest pain, dyspnea, or wheezing.   No Hx of recent travel. No recent sick contact. No known insect bite. + Hx of allergies.  OTC medications for this problem: Mucinex DM Symptoms otherwise stable.  -Hypertension: She also would like to follow on hypertension, she has an appointment for BP check in a couple weeks. Currently she is on Benazepril, which was increased from 10 mg to 20 mg on 11/10/2016. She is checking BP at home and reporting "good" readings. Medication has been well tolerated, no side effects reported.  Denies severe/frequent headache, visual changes, chest pain, dyspnea, palpitation, claudication, focal weakness, or edema.   Review of Systems  Constitutional: Positive for fatigue. Negative for activity change, appetite change and fever.  HENT: Positive for congestion, postnasal drip, rhinorrhea, sinus pressure and sore throat. Negative for ear pain, mouth sores, trouble swallowing and voice change.        Itchy ears  Eyes: Negative for discharge, redness and itching.  Respiratory: Positive for cough. Negative for shortness of breath and wheezing.   Cardiovascular: Negative for chest pain and palpitations.  Gastrointestinal: Negative for abdominal pain, diarrhea, nausea and vomiting.  Musculoskeletal: Positive for myalgias. Negative for gait problem and neck pain.  Skin: Negative for rash.  Allergic/Immunologic: Positive for environmental allergies.  Neurological: Negative for syncope, weakness and headaches.  Hematological: Negative for  adenopathy. Does not bruise/bleed easily.  Psychiatric/Behavioral: Negative for confusion.      Current Outpatient Prescriptions on File Prior to Visit  Medication Sig Dispense Refill  . b complex vitamins capsule Take 1 capsule by mouth daily.    . cetirizine (ZYRTEC) 10 MG tablet Take 10 mg by mouth at bedtime.    . DHA-EPA-Vitamin E (OMEGA-3 COMPLEX PO) Take by mouth.    . IRON PO Take by mouth.    . meclizine (ANTIVERT) 25 MG tablet Take one half to one tablet three times a day for dizziness 30 tablet 0  . meloxicam (MOBIC) 15 MG tablet TAKE 1 TABLET (15 MG TOTAL) BY MOUTH DAILY. 30 tablet 1  . milk thistle 175 MG tablet Take 175 mg by mouth daily.    . Misc Natural Products (TRIPLE FLEX) CAPS Take by mouth.    . montelukast (SINGULAIR) 10 MG tablet Take 1 tablet (10 mg total) by mouth at bedtime. 90 tablet 3  . omeprazole (PRILOSEC) 20 MG capsule Take 1 capsule (20 mg total) by mouth daily. 90 capsule 3  . Potassium Gluconate 595 MG CAPS Take by mouth.    . pravastatin (PRAVACHOL) 20 MG tablet Take 1 tablet (20 mg total) by mouth daily. 90 tablet 2  . traMADol (ULTRAM) 50 MG tablet Take 1 tablet (50 mg total) by mouth at bedtime. 30 tablet 1   No current facility-administered medications on file prior to visit.      Past Medical History:  Diagnosis Date  . Allergy   . Anemia   . Arthritis   . GERD (gastroesophageal reflux disease)   . Hyperlipidemia    Allergies  Allergen Reactions  .  Penicillins   . Sulfur     Social History   Social History  . Marital status: Married    Spouse name: N/A  . Number of children: N/A  . Years of education: N/A   Social History Main Topics  . Smoking status: Former Research scientist (life sciences)  . Smokeless tobacco: Never Used  . Alcohol use No  . Drug use: No  . Sexual activity: Not Asked   Other Topics Concern  . None   Social History Narrative  . None    Vitals:   12/10/16 1140  BP: 136/60  Pulse: 86  Resp: 12  Temp: 98.5 F (36.9 C)    O2 sat 96% at RA.  Body mass index is 28.71 kg/m.   Physical Exam  Nursing note and vitals reviewed. Constitutional: She is oriented to person, place, and time. She appears well-developed and well-nourished. She does not appear ill. No distress.  HENT:  Head: Atraumatic.  Right Ear: Tympanic membrane, external ear and ear canal normal.  Left Ear: Tympanic membrane, external ear and ear canal normal.  Nose: Rhinorrhea present. Right sinus exhibits no maxillary sinus tenderness and no frontal sinus tenderness. Left sinus exhibits no maxillary sinus tenderness and no frontal sinus tenderness.  Mouth/Throat: Oropharynx is clear and moist and mucous membranes are normal.  Post nasal drainage. Ear canal no cerumen.  Eyes: Conjunctivae are normal.  Neck: No muscular tenderness present.  Cardiovascular: Normal rate and regular rhythm.   No murmur heard. Respiratory: Effort normal and breath sounds normal. No stridor. No respiratory distress.  Lymphadenopathy:       Head (right side): No submandibular adenopathy present.       Head (left side): No submandibular adenopathy present.    She has cervical adenopathy (< 1cm).       Right cervical: Posterior cervical adenopathy present.       Left cervical: Posterior cervical adenopathy present.  Neurological: She is alert and oriented to person, place, and time. She has normal strength. Gait normal.  Skin: Skin is warm. No rash noted. No erythema.  Psychiatric: She has a normal mood and affect. Her speech is normal.  Well groomed, good eye contact.      ASSESSMENT AND PLAN:    Tabitha Adams was seen today for cough.  Diagnoses and all orders for this visit:   URI, acute  Symptoms suggests a viral etiology, I explained patient that symptomatic treatment is usually recommended in this case, so I do not think abx is needed at this time. Instructed to monitor for signs of complications, including new onset of fever among some, clearly  instructed about warning signs. I also explained that cough and nasal congestion can last a few days and sometimes weeks. F/U as needed.  Rapid Flu test negative.  -     benzonatate (TESSALON) 100 MG capsule; Take 2 capsules (200 mg total) by mouth 2 (two) times daily as needed for cough.  Essential hypertension, benign  Adequately controlled. No changes in current management. Caution with OTC cold medications. F/U in 3-4 months, before if needed.  -     benazepril (LOTENSIN) 20 MG tablet; Take 1 tablet (20 mg total) by mouth daily.  Chronic allergic rhinitis, unspecified seasonality, unspecified trigger  Could aggravate her symptoms. Continue Singulair 10 mg daily. Resume Flonase intranasal spray.  -     fluticasone (FLONASE) 50 MCG/ACT nasal spray; Place 1 spray into both nostrils 2 (two) times daily as needed for allergies or rhinitis.  Return if symptoms worsen or fail to improve.     -[name] was advised to return or notify a doctor immediately if symptoms worsen or new concerns arise.       Tabitha Adams G. [name], MD  [facility] [place] office.

## 2016-12-10 NOTE — Progress Notes (Signed)
Pre visit review using our clinic review tool, if applicable. No additional management support is needed unless otherwise documented below in the visit note. 

## 2016-12-10 NOTE — Patient Instructions (Addendum)
  [name] I have seen you today for an acute visit.  A few things to remember from today's visit:   URI, acute   viral infections are self-limited and we treat each symptom depending of severity.  Over the counter medications as decongestants and cold medications usually help, they need to be taken with caution if there is a history of high blood pressure or palpitations. Tylenol and/or Ibuprofen also helps with most symptoms (headache, muscle aching, fever,etc) Plenty of fluids. Honey helps with cough. Steam inhalations helps with runny nose, nasal congestion, and may prevent sinus infections. Cough and nasal congestion could last a few days and sometimes weeks. Please follow in not any better in 1-2 weeks or if symptoms get worse.    GERD can also aggravate cough ,so you can take Zantac 150 mg at bedtime for a couple weeks.   In general please monitor for signs of worsening symptoms and seek immediate medical attention if any concerning/warning symptom.  If symptoms are not resolved in 2-3 weeks you should schedule a follow up appointment with your doctor, before if needed.

## 2016-12-30 ENCOUNTER — Other Ambulatory Visit: Payer: Self-pay | Admitting: Family Medicine

## 2016-12-30 DIAGNOSIS — M159 Polyosteoarthritis, unspecified: Secondary | ICD-10-CM

## 2016-12-30 DIAGNOSIS — G894 Chronic pain syndrome: Secondary | ICD-10-CM

## 2017-01-25 ENCOUNTER — Emergency Department (HOSPITAL_COMMUNITY): Payer: Medicare Other

## 2017-01-25 ENCOUNTER — Encounter (HOSPITAL_COMMUNITY)
Admission: EM | Disposition: A | Discharge status: Home / Self Care | Payer: Self-pay | Source: Home / Self Care | Attending: Emergency Medicine

## 2017-01-25 ENCOUNTER — Encounter (HOSPITAL_COMMUNITY): Payer: Self-pay | Admitting: Emergency Medicine

## 2017-01-25 ENCOUNTER — Emergency Department (HOSPITAL_COMMUNITY): Payer: Medicare Other | Admitting: Anesthesiology

## 2017-01-25 ENCOUNTER — Observation Stay (HOSPITAL_COMMUNITY)
Admission: EM | Admit: 2017-01-25 | Discharge: 2017-01-26 | Disposition: A | Discharge status: Home / Self Care | Payer: Medicare Other | Attending: Orthopedic Surgery | Admitting: Orthopedic Surgery

## 2017-01-25 DIAGNOSIS — G8929 Other chronic pain: Secondary | ICD-10-CM | POA: Diagnosis not present

## 2017-01-25 DIAGNOSIS — E785 Hyperlipidemia, unspecified: Secondary | ICD-10-CM | POA: Insufficient documentation

## 2017-01-25 DIAGNOSIS — T148XXA Other injury of unspecified body region, initial encounter: Secondary | ICD-10-CM | POA: Diagnosis not present

## 2017-01-25 DIAGNOSIS — D649 Anemia, unspecified: Secondary | ICD-10-CM | POA: Diagnosis not present

## 2017-01-25 DIAGNOSIS — K219 Gastro-esophageal reflux disease without esophagitis: Secondary | ICD-10-CM | POA: Diagnosis not present

## 2017-01-25 DIAGNOSIS — Z87891 Personal history of nicotine dependence: Secondary | ICD-10-CM | POA: Insufficient documentation

## 2017-01-25 DIAGNOSIS — S0990XA Unspecified injury of head, initial encounter: Secondary | ICD-10-CM | POA: Diagnosis not present

## 2017-01-25 DIAGNOSIS — S060X0A Concussion without loss of consciousness, initial encounter: Secondary | ICD-10-CM | POA: Diagnosis not present

## 2017-01-25 DIAGNOSIS — J309 Allergic rhinitis, unspecified: Secondary | ICD-10-CM | POA: Insufficient documentation

## 2017-01-25 DIAGNOSIS — G5601 Carpal tunnel syndrome, right upper limb: Secondary | ICD-10-CM | POA: Insufficient documentation

## 2017-01-25 DIAGNOSIS — W000XXA Fall on same level due to ice and snow, initial encounter: Secondary | ICD-10-CM | POA: Insufficient documentation

## 2017-01-25 DIAGNOSIS — Z880 Allergy status to penicillin: Secondary | ICD-10-CM | POA: Diagnosis not present

## 2017-01-25 DIAGNOSIS — S61411A Laceration without foreign body of right hand, initial encounter: Secondary | ICD-10-CM | POA: Insufficient documentation

## 2017-01-25 DIAGNOSIS — I10 Essential (primary) hypertension: Secondary | ICD-10-CM | POA: Insufficient documentation

## 2017-01-25 DIAGNOSIS — S52501A Unspecified fracture of the lower end of right radius, initial encounter for closed fracture: Secondary | ICD-10-CM

## 2017-01-25 DIAGNOSIS — Z882 Allergy status to sulfonamides status: Secondary | ICD-10-CM | POA: Insufficient documentation

## 2017-01-25 DIAGNOSIS — Z79899 Other long term (current) drug therapy: Secondary | ICD-10-CM | POA: Diagnosis not present

## 2017-01-25 DIAGNOSIS — M25531 Pain in right wrist: Secondary | ICD-10-CM | POA: Diagnosis not present

## 2017-01-25 DIAGNOSIS — S52561A Barton's fracture of right radius, initial encounter for closed fracture: Principal | ICD-10-CM | POA: Insufficient documentation

## 2017-01-25 DIAGNOSIS — S52611A Displaced fracture of right ulna styloid process, initial encounter for closed fracture: Secondary | ICD-10-CM | POA: Diagnosis not present

## 2017-01-25 DIAGNOSIS — L928 Other granulomatous disorders of the skin and subcutaneous tissue: Secondary | ICD-10-CM | POA: Diagnosis not present

## 2017-01-25 DIAGNOSIS — Z791 Long term (current) use of non-steroidal anti-inflammatories (NSAID): Secondary | ICD-10-CM | POA: Insufficient documentation

## 2017-01-25 DIAGNOSIS — S6991XA Unspecified injury of right wrist, hand and finger(s), initial encounter: Secondary | ICD-10-CM | POA: Diagnosis not present

## 2017-01-25 DIAGNOSIS — S299XXA Unspecified injury of thorax, initial encounter: Secondary | ICD-10-CM | POA: Diagnosis not present

## 2017-01-25 HISTORY — PX: ORIF WRIST FRACTURE: SHX2133

## 2017-01-25 LAB — BASIC METABOLIC PANEL
Anion gap: 7 (ref 5–15)
BUN: 20 mg/dL (ref 6–20)
CO2: 24 mmol/L (ref 22–32)
Calcium: 9.3 mg/dL (ref 8.9–10.3)
Chloride: 104 mmol/L (ref 101–111)
Creatinine, Ser: 0.8 mg/dL (ref 0.44–1.00)
GFR calc non Af Amer: >60 mL/min (ref >60)
Glucose, Bld: 119 mg/dL — ABNORMAL HIGH (ref 65–99)
Potassium: 4.1 mmol/L (ref 3.5–5.1)
SODIUM: 135 mmol/L (ref 135–145)

## 2017-01-25 LAB — CBC
HEMATOCRIT: 31.9 % — AB (ref 36.0–46.0)
HEMOGLOBIN: 11.1 g/dL — AB (ref 12.0–15.0)
MCH: 28.8 pg (ref 26.0–34.0)
MCHC: 34.8 g/dL (ref 30.0–36.0)
MCV: 82.6 fL (ref 78.0–100.0)
Platelets: 244 10*3/uL (ref 150–400)
RBC: 3.86 MIL/uL — AB (ref 3.87–5.11)
RDW: 13.8 % (ref 11.5–15.5)
WBC: 11.8 10*3/uL — ABNORMAL HIGH (ref 4.0–10.5)

## 2017-01-25 SURGERY — OPEN REDUCTION INTERNAL FIXATION (ORIF) WRIST FRACTURE
Anesthesia: General | Laterality: Right

## 2017-01-25 MED ORDER — DEXTROSE 5 % IV SOLN
500.0000 mg | Freq: Four times a day (QID) | INTRAVENOUS | Status: DC | PRN
  Filled 2017-01-25: qty 5

## 2017-01-25 MED ORDER — VANCOMYCIN HCL 1000 MG IV SOLR
INTRAVENOUS | Status: DC | PRN
  Administered 2017-01-25: 1000 mg via INTRAVENOUS

## 2017-01-25 MED ORDER — VANCOMYCIN HCL IN DEXTROSE 1-5 GM/200ML-% IV SOLN
1000.0000 mg | Freq: Once | INTRAVENOUS | Status: DC
  Filled 2017-01-25: qty 200

## 2017-01-25 MED ORDER — PROMETHAZINE HCL 25 MG RE SUPP: 12.5000 mg | Freq: Four times a day (QID) | RECTAL | Status: DC | PRN

## 2017-01-25 MED ORDER — PRAVASTATIN SODIUM 20 MG PO TABS
20.0000 mg | ORAL_TABLET | Freq: Every day | ORAL | Status: DC
  Administered 2017-01-26: 20 mg via ORAL
  Filled 2017-01-25: qty 1

## 2017-01-25 MED ORDER — LACTATED RINGERS IV SOLN
INTRAVENOUS | Status: DC | PRN
Start: 2017-01-25 — End: 2017-01-25
  Administered 2017-01-25: 17:00:00 via INTRAVENOUS

## 2017-01-25 MED ORDER — FLUTICASONE PROPIONATE 50 MCG/ACT NA SUSP
1.0000 | Freq: Two times a day (BID) | NASAL | Status: DC | PRN
  Filled 2017-01-25: qty 16

## 2017-01-25 MED ORDER — BUPIVACAINE-EPINEPHRINE (PF) 0.5% -1:200000 IJ SOLN
INTRAMUSCULAR | Status: DC | PRN
  Administered 2017-01-25: 30 mL via PERINEURAL

## 2017-01-25 MED ORDER — SENNA 8.6 MG PO TABS
1.0000 | ORAL_TABLET | Freq: Two times a day (BID) | ORAL | Status: DC
  Administered 2017-01-25 – 2017-01-26 (×2): 8.6 mg via ORAL
  Filled 2017-01-25 (×2): qty 1

## 2017-01-25 MED ORDER — FENTANYL CITRATE (PF) 100 MCG/2ML IJ SOLN
INTRAMUSCULAR | Status: DC | PRN
  Administered 2017-01-25 (×2): 50 ug via INTRAVENOUS

## 2017-01-25 MED ORDER — METHOCARBAMOL 500 MG PO TABS: 500.0000 mg | ORAL_TABLET | Freq: Four times a day (QID) | ORAL | Status: DC | PRN

## 2017-01-25 MED ORDER — MIDAZOLAM HCL 2 MG/2ML IJ SOLN
2.0000 mg | Freq: Once | INTRAMUSCULAR | Status: AC
  Administered 2017-01-25: 2 mg via INTRAVENOUS

## 2017-01-25 MED ORDER — PANTOPRAZOLE SODIUM 40 MG PO TBEC
40.0000 mg | DELAYED_RELEASE_TABLET | Freq: Every day | ORAL | Status: DC
  Administered 2017-01-26: 40 mg via ORAL
  Filled 2017-01-25: qty 1

## 2017-01-25 MED ORDER — MIDAZOLAM HCL 2 MG/2ML IJ SOLN: 0.5000 mg | Freq: Once | INTRAMUSCULAR | Status: DC

## 2017-01-25 MED ORDER — MIDAZOLAM HCL 2 MG/2ML IJ SOLN
INTRAMUSCULAR | Status: AC
  Administered 2017-01-25: 2 mg via INTRAVENOUS
  Filled 2017-01-25: qty 2

## 2017-01-25 MED ORDER — MECLIZINE HCL 12.5 MG PO TABS
12.5000 mg | ORAL_TABLET | Freq: Three times a day (TID) | ORAL | Status: DC | PRN
  Filled 2017-01-25: qty 1

## 2017-01-25 MED ORDER — LORATADINE 10 MG PO TABS
10.0000 mg | ORAL_TABLET | Freq: Every day | ORAL | Status: DC
Start: 2017-01-25 — End: 2017-01-26
  Administered 2017-01-26: 10 mg via ORAL
  Filled 2017-01-25: qty 1

## 2017-01-25 MED ORDER — MORPHINE SULFATE (PF) 4 MG/ML IV SOLN
4.0000 mg | Freq: Once | INTRAVENOUS | Status: AC
  Administered 2017-01-25: 4 mg via INTRAVENOUS
  Filled 2017-01-25: qty 1

## 2017-01-25 MED ORDER — ADULT MULTIVITAMIN W/MINERALS CH
1.0000 | ORAL_TABLET | Freq: Every day | ORAL | Status: DC
  Administered 2017-01-26: 1 via ORAL
  Filled 2017-01-25: qty 1

## 2017-01-25 MED ORDER — VITAMIN C 500 MG PO TABS
1000.0000 mg | ORAL_TABLET | Freq: Every day | ORAL | Status: DC
  Administered 2017-01-26: 1000 mg via ORAL
  Filled 2017-01-25: qty 2

## 2017-01-25 MED ORDER — FENTANYL CITRATE (PF) 100 MCG/2ML IJ SOLN
INTRAMUSCULAR | Status: AC
  Filled 2017-01-25: qty 2

## 2017-01-25 MED ORDER — ONDANSETRON HCL 4 MG PO TABS: 4.0000 mg | ORAL_TABLET | Freq: Four times a day (QID) | ORAL | Status: DC | PRN

## 2017-01-25 MED ORDER — FENTANYL CITRATE (PF) 100 MCG/2ML IJ SOLN
100.0000 ug | Freq: Once | INTRAMUSCULAR | Status: AC
  Administered 2017-01-25: 100 ug via INTRAVENOUS

## 2017-01-25 MED ORDER — BENAZEPRIL HCL 20 MG PO TABS
20.0000 mg | ORAL_TABLET | Freq: Every day | ORAL | Status: DC
  Administered 2017-01-26: 20 mg via ORAL
  Filled 2017-01-25: qty 1

## 2017-01-25 MED ORDER — LACTATED RINGERS IV SOLN
INTRAVENOUS | Status: DC
  Administered 2017-01-25 (×2): via INTRAVENOUS

## 2017-01-25 MED ORDER — ONDANSETRON HCL 4 MG/2ML IJ SOLN
4.0000 mg | Freq: Once | INTRAMUSCULAR | Status: AC
  Administered 2017-01-25: 4 mg via INTRAVENOUS
  Filled 2017-01-25: qty 2

## 2017-01-25 MED ORDER — FERROUS SULFATE 325 (65 FE) MG PO TABS
325.0000 mg | ORAL_TABLET | ORAL | Status: DC
  Administered 2017-01-26: 325 mg via ORAL
  Filled 2017-01-25: qty 1

## 2017-01-25 MED ORDER — MIDAZOLAM HCL 5 MG/5ML IJ SOLN
INTRAMUSCULAR | Status: DC | PRN
  Administered 2017-01-25: 2 mg via INTRAVENOUS

## 2017-01-25 MED ORDER — SODIUM CHLORIDE 0.45 % IV SOLN
INTRAVENOUS | Status: DC
  Administered 2017-01-25: 22:00:00 via INTRAVENOUS

## 2017-01-25 MED ORDER — ONDANSETRON HCL 4 MG/2ML IJ SOLN
4.0000 mg | Freq: Four times a day (QID) | INTRAMUSCULAR | Status: DC | PRN
  Administered 2017-01-25 – 2017-01-26 (×3): 4 mg via INTRAVENOUS
  Filled 2017-01-25 (×3): qty 2

## 2017-01-25 MED ORDER — MIDAZOLAM HCL 2 MG/2ML IJ SOLN
INTRAMUSCULAR | Status: AC
  Filled 2017-01-25: qty 2

## 2017-01-25 MED ORDER — PROPOFOL 1000 MG/100ML IV EMUL
INTRAVENOUS | Status: AC
  Filled 2017-01-25: qty 200

## 2017-01-25 MED ORDER — MORPHINE SULFATE (PF) 2 MG/ML IV SOLN: 1.0000 mg | INTRAVENOUS | Status: DC | PRN

## 2017-01-25 MED ORDER — MONTELUKAST SODIUM 10 MG PO TABS
10.0000 mg | ORAL_TABLET | Freq: Every day | ORAL | Status: DC
  Administered 2017-01-25: 10 mg via ORAL
  Filled 2017-01-25: qty 1

## 2017-01-25 MED ORDER — ALPRAZOLAM 0.5 MG PO TABS: 0.5000 mg | ORAL_TABLET | Freq: Four times a day (QID) | ORAL | Status: DC | PRN

## 2017-01-25 MED ORDER — TRAMADOL HCL 50 MG PO TABS
50.0000 mg | ORAL_TABLET | Freq: Every day | ORAL | Status: DC
  Administered 2017-01-25: 50 mg via ORAL
  Filled 2017-01-25: qty 1

## 2017-01-25 MED ORDER — OXYCODONE HCL 5 MG PO TABS
5.0000 mg | ORAL_TABLET | ORAL | Status: DC | PRN
  Administered 2017-01-26: 5 mg via ORAL
  Administered 2017-01-26: 10 mg via ORAL
  Filled 2017-01-25: qty 2
  Filled 2017-01-25 (×3): qty 1

## 2017-01-25 MED ORDER — FENTANYL CITRATE (PF) 100 MCG/2ML IJ SOLN: 100.0000 ug | Freq: Once | INTRAMUSCULAR | Status: DC

## 2017-01-25 MED ORDER — FENTANYL CITRATE (PF) 100 MCG/2ML IJ SOLN
50.0000 ug | Freq: Once | INTRAMUSCULAR | Status: AC
  Administered 2017-01-25: 50 ug via INTRAVENOUS
  Filled 2017-01-25: qty 2

## 2017-01-25 MED ORDER — SODIUM CHLORIDE 0.9 % IR SOLN
Status: DC | PRN
  Administered 2017-01-25: 1000 mL

## 2017-01-25 MED ORDER — PROPOFOL 500 MG/50ML IV EMUL
INTRAVENOUS | Status: DC | PRN
  Administered 2017-01-25: 25 ug/kg/min via INTRAVENOUS

## 2017-01-25 SURGICAL SUPPLY — 64 items
BANDAGE ACE 4X5 VEL STRL LF (GAUZE/BANDAGES/DRESSINGS) ×3 IMPLANT
BANDAGE ELASTIC 3 VELCRO ST LF (GAUZE/BANDAGES/DRESSINGS) ×3 IMPLANT
BIT DRILL 2X3.5 HAND QK RELEAS (BIT) ×1 IMPLANT
BIT DRILL 2X3.5 QUICK RELEASE (BIT) ×3
BIT DRILL MINI LNG ACUTRAK 2 (BIT) IMPLANT
BLADE CLIPPER SURG (BLADE) IMPLANT
BNDG CMPR 9X4 STRL LF SNTH (GAUZE/BANDAGES/DRESSINGS) ×1
BNDG ESMARK 4X9 LF (GAUZE/BANDAGES/DRESSINGS) ×3 IMPLANT
BNDG GAUZE ELAST 4 BULKY (GAUZE/BANDAGES/DRESSINGS) ×5 IMPLANT
CANISTER SUCT 3000ML PPV (MISCELLANEOUS) ×3 IMPLANT
CORDS BIPOLAR (ELECTRODE) ×3 IMPLANT
COVER SURGICAL LIGHT HANDLE (MISCELLANEOUS) ×3 IMPLANT
CUFF TOURNIQUET SINGLE 18IN (TOURNIQUET CUFF) ×3 IMPLANT
CUFF TOURNIQUET SINGLE 24IN (TOURNIQUET CUFF) IMPLANT
DRAIN TLS ROUND 10FR (DRAIN) IMPLANT
DRAPE OEC MINIVIEW 54X84 (DRAPES) ×2 IMPLANT
DRAPE SURG 17X23 STRL (DRAPES) ×3 IMPLANT
DRILL MINI LNG ACUTRAK 2 (BIT) ×3
GAUZE SPONGE 4X4 12PLY STRL (GAUZE/BANDAGES/DRESSINGS) ×3 IMPLANT
GAUZE XEROFORM 1X8 LF (GAUZE/BANDAGES/DRESSINGS) ×3 IMPLANT
GLOVE BIOGEL M 8.0 STRL (GLOVE) ×3 IMPLANT
GLOVE SS BIOGEL STRL SZ 8 (GLOVE) ×1 IMPLANT
GLOVE SUPERSENSE BIOGEL SZ 8 (GLOVE) ×2
GOWN STRL REUS W/ TWL LRG LVL3 (GOWN DISPOSABLE) ×1 IMPLANT
GOWN STRL REUS W/ TWL XL LVL3 (GOWN DISPOSABLE) ×2 IMPLANT
GOWN STRL REUS W/TWL LRG LVL3 (GOWN DISPOSABLE) ×6
GOWN STRL REUS W/TWL XL LVL3 (GOWN DISPOSABLE) ×3
GUIDEWIRE ORTHO MINI ACTK .045 (WIRE) ×3 IMPLANT
KIT BASIN OR (CUSTOM PROCEDURE TRAY) ×3 IMPLANT
KIT ROOM TURNOVER OR (KITS) ×3 IMPLANT
LOOP VESSEL MAXI BLUE (MISCELLANEOUS) IMPLANT
MANIFOLD NEPTUNE II (INSTRUMENTS) ×1 IMPLANT
NEEDLE 22X1 1/2 (OR ONLY) (NEEDLE) IMPLANT
NS IRRIG 1000ML POUR BTL (IV SOLUTION) ×3 IMPLANT
PACK ORTHO EXTREMITY (CUSTOM PROCEDURE TRAY) ×3 IMPLANT
PAD ARMBOARD 7.5X6 YLW CONV (MISCELLANEOUS) ×6 IMPLANT
PAD CAST 3X4 CTTN HI CHSV (CAST SUPPLIES) ×1 IMPLANT
PAD CAST 4YDX4 CTTN HI CHSV (CAST SUPPLIES) ×1 IMPLANT
PADDING CAST COTTON 3X4 STRL (CAST SUPPLIES) ×3
PADDING CAST COTTON 4X4 STRL (CAST SUPPLIES) ×3
PLATE 1.3 T-SHAPED (Plate) ×2 IMPLANT
PUTTY DBM STAGRAFT PLUS 2CC (Putty) ×3 IMPLANT
SCREW ACUTRAK 2 MINI 30MM (Screw) ×2 IMPLANT
SCREW BONE LAG 2.3X10MM HEXA (Screw) ×1 IMPLANT
SCREW BONE NL 2.3X14MM HEXA (Screw) IMPLANT
SCREW LAG 2.3X10MM (Screw) ×3 IMPLANT
SCREW NON LOCK 2.3X14 (Screw) ×6 IMPLANT
SCREW NONLOCK 2.3X13MM (Screw) ×2 IMPLANT
SCRUB BETADINE 4OZ XXX (MISCELLANEOUS) ×3 IMPLANT
SOLUTION BETADINE 4OZ (MISCELLANEOUS) ×3 IMPLANT
SPONGE LAP 4X18 X RAY DECT (DISPOSABLE) IMPLANT
SUT FIBER WIRE 4.0 (SUTURE) ×3 IMPLANT
SUT MNCRL AB 4-0 PS2 18 (SUTURE) ×1 IMPLANT
SUT PROLENE 3 0 PS 2 (SUTURE) IMPLANT
SUT PROLENE 4 0 PS 2 18 (SUTURE) ×6 IMPLANT
SUT VIC AB 3-0 FS2 27 (SUTURE) IMPLANT
SYR CONTROL 10ML LL (SYRINGE) IMPLANT
SYSTEM CHEST DRAIN TLS 7FR (DRAIN) IMPLANT
TOWEL OR 17X24 6PK STRL BLUE (TOWEL DISPOSABLE) ×1 IMPLANT
TOWEL OR 17X26 10 PK STRL BLUE (TOWEL DISPOSABLE) IMPLANT
TUBE CONNECTING 12'X1/4 (SUCTIONS) ×1
TUBE CONNECTING 12X1/4 (SUCTIONS) ×2 IMPLANT
TUBE EVACUATION TLS (MISCELLANEOUS) ×1 IMPLANT
WATER STERILE IRR 1000ML POUR (IV SOLUTION) ×3 IMPLANT

## 2017-01-25 NOTE — Progress Notes (Signed)
Pharmacy Antibiotic Note  Tabitha Adams is a 71 y.o. female admitted on 01/25/2017 with surgical prophylaxis.  Pharmacy has been consulted for Vancomycin x 24hr dosing.  S/P ORIF of R-wrist fx after falling on ice..  Cr < 1, CrCl ~50.  Noted pt received Vancomycin 1000mg  IV ~1645 on surgical flowsheet.  Plan: Continue Vancomycin 1000mg  IV q24 x 1 more dose  Height: 4\' 11"  (149.9 cm) Weight: 140 lb (63.5 kg) IBW/kg (Calculated) : 43.2  Temp (24hrs), Avg:98.1 F (36.7 C), Min:97.7 F (36.5 C), Max:98.3 F (36.8 C)   Recent Labs Lab 01/25/17 1332  WBC 11.8*  CREATININE 0.80    Estimated Creatinine Clearance: 53 mL/min (by C-G formula based on SCr of 0.8 mg/dL).    Allergies  Allergen Reactions  . Sulfur Swelling and Other (See Comments)    Reaction:  All over body swelling   . Penicillins Rash and Other (See Comments)    Has patient had a PCN reaction causing immediate rash, facial/tongue/throat swelling, SOB or lightheadedness with hypotension: No Has patient had a PCN reaction causing severe rash involving mucus membranes or skin necrosis: No Has patient had a PCN reaction that required hospitalization No Has patient had a PCN reaction occurring within the last 10 years: No If all of the above answers are "NO", then may proceed with Cephalosporin use.    Thank you for allowing pharmacy to be a part of this patient's care.  Gracy Bruins, PharmD Clinical Pharmacist [facility]

## 2017-01-25 NOTE — Anesthesia Preprocedure Evaluation (Signed)
Anesthesia Evaluation  Patient identified by MRN, date of birth, ID band Patient awake    Reviewed: Allergy & Precautions, NPO status , Patient's Chart, lab work & pertinent test results  Airway Mallampati: I  TM Distance: >3 FB Neck ROM: Full    Dental   Pulmonary former smoker,    Pulmonary exam normal        Cardiovascular hypertension, Pt. on medications Normal cardiovascular exam     Neuro/Psych    GI/Hepatic GERD  Medicated and Controlled,  Endo/Other    Renal/GU      Musculoskeletal   Abdominal   Peds  Hematology   Anesthesia Other Findings   Reproductive/Obstetrics                             Anesthesia Physical Anesthesia Plan  ASA: II  Anesthesia Plan: Regional   Post-op Pain Management:  Regional for Post-op pain   Induction: Intravenous  Airway Management Planned: Simple Face Mask  Additional Equipment:   Intra-op Plan:   Post-operative Plan:   Informed Consent: I have reviewed the patients History and Physical, chart, labs and discussed the procedure including the risks, benefits and alternatives for the proposed anesthesia with the patient or authorized representative who has indicated his/her understanding and acceptance.     Plan Discussed with: Surgeon  Anesthesia Plan Comments:         Anesthesia Quick Evaluation

## 2017-01-25 NOTE — H&P (Signed)
Tabitha Adams is an 71 y.o. female.   Chief Complaint: Right wrist fx HPI: Tabitha Adams was starting to move her outdoor recycling container when she slipped on the ice and fell forward onto her right hand. The lid came down and struck her on the back of her head and addled her briefly but she did not lose consciousness. A neighbor helped her and she was brought to the ED for evaluation. X-rays showed a right wrist fx and orthopedic surgery was consulted. She c/o right wrist pain but denies pain elsewhere or any post-concussive symptoms.  Past Medical History:  Diagnosis Date  . Allergy   . Anemia   . Arthritis   . GERD (gastroesophageal reflux disease)   . Hyperlipidemia     History reviewed. No pertinent surgical history.  Family History  Problem Relation Age of Onset  . Stroke Father   . Hypertension Father   . Alcohol abuse Father    Social History:  reports that she has quit smoking. She has never used smokeless tobacco. She reports that she does not drink alcohol or use drugs.  Allergies:  Allergies  Allergen Reactions  . Sulfur Swelling and Other (See Comments)    Reaction:  All over body swelling   . Penicillins Rash and Other (See Comments)    Has patient had a PCN reaction causing immediate rash, facial/tongue/throat swelling, SOB or lightheadedness with hypotension: No Has patient had a PCN reaction causing severe rash involving mucus membranes or skin necrosis: No Has patient had a PCN reaction that required hospitalization No Has patient had a PCN reaction occurring within the last 10 years: No If all of the above answers are "NO", then may proceed with Cephalosporin use.     (Not in a hospital admission)  Results for orders placed or performed during the hospital encounter of 01/25/17 (from the past 48 hour(s))  CBC     Status: Abnormal   Collection Time: 01/25/17  1:32 PM  Result Value Ref Range   WBC 11.8 (H) 4.0 - 10.5 K/uL   RBC 3.86 (L) 3.87 - 5.11 MIL/uL    Hemoglobin 11.1 (L) 12.0 - 15.0 g/dL   HCT 31.9 (L) 36.0 - 46.0 %   MCV 82.6 78.0 - 100.0 fL   MCH 28.8 26.0 - 34.0 pg   MCHC 34.8 30.0 - 36.0 g/dL   RDW 13.8 11.5 - 15.5 %   Platelets 244 150 - 400 K/uL   Dg Wrist Complete Right  Result Date: 01/25/2017 CLINICAL DATA:  Fall.  Right wrist injury EXAM: RIGHT WRIST - COMPLETE 3+ VIEW COMPARISON:  None. FINDINGS: Comminuted, displaced fracture through the distal right radius. Distal fragments are displaced posteriorly relative to the radial shaft. Probable distal ulnar fractures as well, less well visualized. IMPRESSION: Posteriorly displaced distal right radial and probable ulnar fractures. Electronically Signed   By: Rolm Baptise M.D.   On: 01/25/2017 12:14   Ct Head Wo Contrast  Result Date: 01/25/2017 CLINICAL DATA:  Slipped and fell hitting head EXAM: CT HEAD WITHOUT CONTRAST TECHNIQUE: Contiguous axial images were obtained from the base of the skull through the vertex without intravenous contrast. COMPARISON:  None. FINDINGS: Brain: The ventricular system is prominent as are the cortical sulci, consistent with diffuse atrophy. Moderate small vessel ischemic change is also noted throughout periventricular white matter. No hemorrhage, mass lesion, or acute infarction is seen. The fourth ventricle and basilar cisterns are unremarkable. Vascular: No vascular abnormality is seen on this unenhanced study.  Skull: On bone window images no acute calvarial abnormality is seen. Two bony nodular areas are noted emanating from the frontal calvarium appearing benign. Sinuses/Orbits: There is air-fluid level within the right maxillary sinus consistent with right maxillary sinusitis. The remainder of the paranasal sinuses are well pneumatized. Other: None. IMPRESSION: 1. Atrophy and moderate small vessel ischemic change. No acute intracranial abnormality. 2. Right maxillary sinusitis. Electronically Signed   By: Ivar Drape M.D.   On: 01/25/2017 11:10     Review of Systems  Constitutional: Negative for weight loss.  HENT: Negative for ear discharge, ear pain, hearing loss and tinnitus.   Eyes: Negative for blurred vision, double vision, photophobia and pain.  Respiratory: Negative for cough, sputum production and shortness of breath.   Cardiovascular: Negative for chest pain.  Gastrointestinal: Negative for abdominal pain, nausea and vomiting.  Genitourinary: Negative for dysuria, flank pain, frequency and urgency.  Musculoskeletal: Positive for joint pain (Right wrist). Negative for back pain, falls, myalgias and neck pain.  Neurological: Negative for dizziness, tingling, sensory change, focal weakness, loss of consciousness and headaches.  Endo/Heme/Allergies: Does not bruise/bleed easily.  Psychiatric/Behavioral: Negative for depression, memory loss and substance abuse. The patient is not nervous/anxious.     Blood pressure 102/56, pulse 82, temperature 97.7 F (36.5 C), temperature source Oral, resp. rate 25, height 4\' 11"  (1.499 m), weight 63.5 kg (140 lb), SpO2 95 %. Physical Exam  Constitutional: She is oriented to person, place, and time. She appears well-developed and well-nourished. No distress.  HENT:  Head: Normocephalic and atraumatic.  Eyes: Conjunctivae are normal. Right eye exhibits no discharge. Left eye exhibits no discharge. No scleral icterus.  Neck: Normal range of motion. Neck supple.  Cardiovascular: Normal rate, regular rhythm, normal heart sounds and intact distal pulses.  Exam reveals no gallop and no friction rub.   No murmur heard. Respiratory: Effort normal and breath sounds normal. No respiratory distress. She has no wheezes. She has no rales.  GI: Soft.  Musculoskeletal:       Right wrist: She exhibits tenderness, bony tenderness, swelling and deformity.  Right wrist: Edema, deformity, TTP over radial aspect Paresthesias in the median nerve distribution  Lymphadenopathy:    She has no cervical  adenopathy.  Neurological: She is alert and oriented to person, place, and time.  Skin: Skin is warm and dry. She is not diaphoretic.     Assessment/Plan Fall Right wrist fx (dorsal Barton's fx) -- Will need ORIF this evening by Dr. Amedeo Plenty at [facility] due to scheduling considerations. Will place in sturdier splint for transport. Maintain NPO. Consider bone density testing as OP if not already being followed. Mild concussion -- No current symptoms, recurrence unlikely    Lisette Abu, PA-C 01/25/2017, 1:56 PM

## 2017-01-25 NOTE — Anesthesia Procedure Notes (Signed)
Anesthesia Regional Block: Supraclavicular block   Pre-Anesthetic Checklist: ,, timeout performed, Correct Patient, Correct Site, Correct Laterality, Correct Procedure, Correct Position, site marked, Risks and benefits discussed,  Surgical consent,  Pre-op evaluation,  At surgeon's request and post-op pain management  Laterality: Right  Prep: chloraprep       Needles:   Needle Type: Echogenic Stimulator Needle     Needle Length: 9cm  Needle Gauge: 21     Additional Needles:   Procedures: ultrasound guided, nerve stimulator,,,,,,   Nerve Stimulator or Paresthesia:  Response: 0.4 mA,   Additional Responses:   Narrative:  Start time: 01/25/2017 4:15 PM End time: 01/25/2017 4:25 PM Injection made incrementally with aspirations every 5 mL.  Performed by: Personally  Anesthesiologist: Lillia Abed  Additional Notes: Monitors applied. Patient sedated. Sterile prep and drape,hand hygiene and sterile gloves were used. Relevant anatomy identified.Needle position confirmed.Local anesthetic injected incrementally after negative aspiration. Local anesthetic spread visualized around nerve(s). Vascular puncture avoided. No complications. Image printed for medical record.The patient tolerated the procedure well.

## 2017-01-25 NOTE — ED Notes (Addendum)
PT WILL BE TRANSPORTED TO [facility] SHORT STAY RM 36 VIA [facility]. AAOX4. PT IN NO APPARENT DISTRESS OR PAIN. THE OPPORTUNITY TO ASK QUESTIONS WAS PROVIDED. FAMILY AT THE BEDSIDE.

## 2017-01-25 NOTE — ED Provider Notes (Addendum)
[facility] DEPT Provider Note   CSN: [id number] Arrival date & time: 01/25/17  1324     History   Chief Complaint Chief Complaint  Patient presents with  . Fall    HPI ANDERA CRANMER is a 71 y.o. female.  HPI Patient presents to the emergency room for evaluation of right wrist pain. The patient was walking outside when she slipped and fell. She landed on her right wrist. Patient states the garbage can lid and also flipped up in the air and then struck her on the head. She is complaining of a headache. She definitely felt dazed after the event but is not sure if she lost consciousness. Her primary complaint is right wrist pain. She denies any trouble with any chest pain or shortness of breath. No numbness or weakness. No neck pain or back pain. Past Medical History:  Diagnosis Date  . Allergy   . Anemia   . Arthritis   . GERD (gastroesophageal reflux disease)   . Hyperlipidemia     Patient Active Problem List   Diagnosis Date Noted  . Essential hypertension, benign 10/04/2016  . Generalized osteoarthritis of multiple sites 08/31/2016  . Chronic back pain 07/13/2016  . Hyperlipidemia 07/13/2016  . Allergic rhinitis 07/13/2016  . GERD (gastroesophageal reflux disease) 07/13/2016  . Chronic pain disorder 07/13/2016    History reviewed. No pertinent surgical history.  OB History    No data available       Home Medications    Prior to Admission medications   Medication Sig Start Date End Date Taking? Authorizing Provider  b complex vitamins capsule Take 1 capsule by mouth daily.   Yes Historical Provider, MD  benazepril (LOTENSIN) 20 MG tablet Take 1 tablet (20 mg total) by mouth daily. 12/10/16  Yes [name] [name], MD  cetirizine (ZYRTEC) 10 MG tablet Take 10 mg by mouth at bedtime.   Yes Historical Provider, MD  ferrous sulfate 325 (65 FE) MG tablet Take 325 mg by mouth every Monday, Wednesday, and Friday.   Yes Historical Provider, MD  fluticasone (FLONASE)  50 MCG/ACT nasal spray Place 1 spray into both nostrils 2 (two) times daily as needed for allergies or rhinitis. 12/10/16  Yes [name] [name], MD  glucosamine-chondroitin 500-400 MG tablet Take 1 tablet by mouth 2 (two) times daily.   Yes Historical Provider, MD  meclizine (ANTIVERT) 25 MG tablet Take one half to one tablet three times a day for dizziness Patient taking differently: Take 12.5-25 mg by mouth 3 (three) times daily as needed for dizziness.  09/24/16  Yes Dorothyann Peng, NP  meloxicam (MOBIC) 15 MG tablet TAKE 1 TABLET BY MOUTH DAILY Patient taking differently: TAKE 1 TABLET BY MOUTH AT BEDTIME 12/30/16  Yes [name] [name], MD  montelukast (SINGULAIR) 10 MG tablet Take 1 tablet (10 mg total) by mouth at bedtime. 08/31/16  Yes [name] [name], MD  Multiple Vitamin (MULTIVITAMIN WITH MINERALS) TABS tablet Take 1 tablet by mouth daily.   Yes Historical Provider, MD  omega-3 acid ethyl esters (LOVAZA) 1 g capsule Take 1-2 g by mouth 2 (two) times daily. Pt takes one capsule in the morning and two at night.   Yes Historical Provider, MD  omeprazole (PRILOSEC) 20 MG capsule Take 1 capsule (20 mg total) by mouth daily. 08/31/16  Yes [name] [name], MD  pravastatin (PRAVACHOL) 20 MG tablet Take 1 tablet (20 mg total) by mouth daily. 08/31/16  Yes [name] [name], MD  traMADol Veatrice Bourbon)  50 MG tablet Take 1 tablet (50 mg total) by mouth at bedtime. 07/13/16  Yes [name] [name], MD    Family History Family History  Problem Relation Age of Onset  . Stroke Father   . Hypertension Father   . Alcohol abuse Father     Social History Social History  Substance Use Topics  . Smoking status: Former Research scientist (life sciences)  . Smokeless tobacco: Never Used  . Alcohol use No     Allergies   Sulfur and Penicillins   Review of Systems Review of Systems  All other systems reviewed and are negative.    Physical Exam Updated Vital Signs BP 102/56 (BP Location: Left Arm)   Pulse 82   Temp 97.7 F (36.5 C)  (Oral)   Resp 25   Ht 4\' 11"  (1.499 m)   Wt 63.5 kg   SpO2 95%   BMI 28.28 kg/m   Physical Exam  Constitutional: No distress.  HENT:  Head: Normocephalic and atraumatic.  Right Ear: External ear normal.  Left Ear: External ear normal.  Eyes: Conjunctivae are normal. Right eye exhibits no discharge. Left eye exhibits no discharge. No scleral icterus.  Neck: Neck supple. No tracheal deviation present.  Cardiovascular: Normal rate, regular rhythm and intact distal pulses.   Pulmonary/Chest: Effort normal and breath sounds normal. No stridor. No respiratory distress. She has no wheezes. She has no rales.  Abdominal: Soft. Bowel sounds are normal. She exhibits no distension. There is no tenderness. There is no rebound and no guarding.  Musculoskeletal: She exhibits no edema.       Right shoulder: Normal.       Left shoulder: Normal.       Right elbow: Normal.      Right wrist: She exhibits tenderness, bony tenderness, swelling and deformity. She exhibits no laceration.       Left wrist: Normal.       Right hip: Normal.       Left hip: Normal.       Right knee: Normal.       Left knee: Normal.       Right ankle: Normal.       Left ankle: Normal.       Cervical back: Normal.       Thoracic back: Normal.       Lumbar back: Normal.  sensation intact in fingers.  Able to move digits; superficial 1 cm laceration over thenar eminence, not contiguous with the fracture  Neurological: She is alert. She has normal strength. No cranial nerve deficit (no facial droop, extraocular movements intact, no slurred speech) or sensory deficit. She exhibits normal muscle tone. She displays no seizure activity. Coordination normal.  Skin: Skin is warm and dry. No rash noted.  Psychiatric: She has a normal mood and affect.  Nursing note and vitals reviewed.    ED Treatments / Results    Radiology Dg Wrist Complete Right  Result Date: 01/25/2017 CLINICAL DATA:  Fall.  Right wrist injury EXAM: RIGHT  WRIST - COMPLETE 3+ VIEW COMPARISON:  None. FINDINGS: Comminuted, displaced fracture through the distal right radius. Distal fragments are displaced posteriorly relative to the radial shaft. Probable distal ulnar fractures as well, less well visualized. IMPRESSION: Posteriorly displaced distal right radial and probable ulnar fractures. Electronically Signed   By: Rolm Baptise M.D.   On: 01/25/2017 12:14   Ct Head Wo Contrast  Result Date: 01/25/2017 CLINICAL DATA:  Slipped and fell hitting head EXAM: CT HEAD WITHOUT  CONTRAST TECHNIQUE: Contiguous axial images were obtained from the base of the skull through the vertex without intravenous contrast. COMPARISON:  None. FINDINGS: Brain: The ventricular system is prominent as are the cortical sulci, consistent with diffuse atrophy. Moderate small vessel ischemic change is also noted throughout periventricular white matter. No hemorrhage, mass lesion, or acute infarction is seen. The fourth ventricle and basilar cisterns are unremarkable. Vascular: No vascular abnormality is seen on this unenhanced study. Skull: On bone window images no acute calvarial abnormality is seen. Two bony nodular areas are noted emanating from the frontal calvarium appearing benign. Sinuses/Orbits: There is air-fluid level within the right maxillary sinus consistent with right maxillary sinusitis. The remainder of the paranasal sinuses are well pneumatized. Other: None. IMPRESSION: 1. Atrophy and moderate small vessel ischemic change. No acute intracranial abnormality. 2. Right maxillary sinusitis. Electronically Signed   By: Ivar Drape M.D.   On: 01/25/2017 11:10   Dg Chest Portable 1 View  Result Date: 01/25/2017 CLINICAL DATA:  [place] today fracturing the right arm, preop chest x-ray EXAM: PORTABLE CHEST 1 VIEW COMPARISON:  None. FINDINGS: No active infiltrate or effusion is seen. Mediastinal and hilar contours are unremarkable. The heart is within normal limits in size. There is a  mild thoracic curvature convex to the right. IMPRESSION: No active lung disease.  Thoracic scoliosis. Electronically Signed   By: Ivar Drape M.D.   On: 01/25/2017 14:00    Procedures Procedures (including critical care time)  Medications Ordered in ED Medications  morphine 4 MG/ML injection 4 mg (4 mg Intravenous Given 01/25/17 1248)     Initial Impression / Assessment and Plan / ED Course  I have reviewed the triage vital signs and the nursing notes.  Pertinent labs & imaging results that were available during my care of the patient were reviewed by me and considered in my medical decision making (see chart for details).  Head CT does not show any evidence of any significant injury.  Pt's xrays show a distal radius fracture with significant posterior displacement.  I discussed the case with Dr. Amedeo Plenty.  This type of fracture will be difficult to reduce. He recommends transfer to [facility] so that he can take her to the operating room and surgically repair her displaced fracture.   DIscussed findings with patient.  Plan on transfer  Pt has a superficial laceration of over the thenar eminence.  No repair necessary. Not an open fracture  Final Clinical Impressions(s) / ED Diagnoses   Final diagnoses:  Closed fracture of distal end of right radius, unspecified fracture morphology, initial encounter        Dorie Rank, MD 01/25/17 1524

## 2017-01-25 NOTE — ED Triage Notes (Signed)
Per EMS-states she was taking out garbage can when she slipped and fell injuring right wrist-states obvious swelling and deformity/fracture-100 mcg of Fentanyl given in route-states lid of trash can hit her head-no LOC

## 2017-01-25 NOTE — ED Notes (Signed)
CONSENT SIGNED AND WITNESSED.

## 2017-01-25 NOTE — Transfer of Care (Signed)
Immediate Anesthesia Transfer of Care Note  Patient: Tabitha Adams  Procedure(s) Performed: Procedure(s): OPEN REDUCTION INTERNAL FIXATION (ORIF) WRIST FRACTURE (Right)  Patient Location: PACU  Anesthesia Type:Regional  Level of Consciousness: awake  Airway & Oxygen Therapy: Patient Spontanous Breathing  Post-op Assessment: Report given to RN and Post -op Vital signs reviewed and stable  Post vital signs: Reviewed and stable  Last Vitals:  Vitals:   01/25/17 1625 01/25/17 1815  BP: (!) 123/49 118/72  Pulse: 96 81  Resp: 12 13  Temp:  36.8 C    Last Pain:  Vitals:   01/25/17 1815  TempSrc:   PainSc: 0-No pain      Patients Stated Pain Goal: 4 (09/16/10 1735)  Complications: No apparent anesthesia complications

## 2017-01-25 NOTE — Op Note (Signed)
See dictation#[id number] Amedeo Plenty MD

## 2017-01-25 NOTE — ED Notes (Signed)
Bed: WA20 Expected date:  Expected time:  Means of arrival:  Comments: EMS-wrist injury

## 2017-01-26 ENCOUNTER — Encounter (HOSPITAL_COMMUNITY): Payer: Self-pay | Admitting: Orthopedic Surgery

## 2017-01-26 DIAGNOSIS — S52561A Barton's fracture of right radius, initial encounter for closed fracture: Secondary | ICD-10-CM | POA: Diagnosis not present

## 2017-01-26 MED ORDER — ASCORBIC ACID 1000 MG PO TABS: 1000.0000 mg | ORAL_TABLET | Freq: Every day | ORAL | 0 refills | Status: AC

## 2017-01-26 MED ORDER — OXYCODONE HCL 5 MG PO TABS: 5.0000 mg | ORAL_TABLET | ORAL | 0 refills | Status: DC | PRN

## 2017-01-26 NOTE — Evaluation (Signed)
Occupational Therapy Evaluation and Discharge Patient Details Name: Tabitha Adams MRN: [id number] DOB: [birth year] Today's Date: 01/26/2017    History of Present Illness 71 y/o female fell on the ice and is now s/p ORIF comminuted complex intra- articular distal radius fracture with an Acumed T-plate dorsally along with an Acutrak screw in the radial styloid and associated stay graft bone graft; Right open carpal tunnel release; Closed treatment distal ulna fracture; Irrigation debridement of skin, subcutaneous tissue and muscle, 1.5 cm thenar laceration. Pt  has a past medical history including Anemia; Arthritis.   Clinical Impression   PTA Pt independent in ADL and mobility. Pt currently mod A for ADL and min guard for mobility. OT spent significant time providing education in compensatory strategies for BUE activities and ADL as well as education elevation, sleeping, edema control and safety. Granddaughter present and both Pt and granddaughter with no questions or concerns at the end of session. OT to sign off at this point. Thank you for this referral.     Follow Up Recommendations  No OT follow up;Supervision/Assistance - 24 hour (initially)    Equipment Recommendations  3 in 1 bedside commode    Recommendations for Other Services Other (comment) (rehab of arm as ordered by MD at follow up)     Precautions / Restrictions Precautions Precautions: Fall Restrictions Weight Bearing Restrictions: Yes RUE Weight Bearing: Non weight bearing      Mobility Bed Mobility Overal bed mobility: Modified Independent             General bed mobility comments: Able to go sit to supine with increased time  Transfers Overall transfer level: Modified independent Equipment used: None             General transfer comment: pt moving slowly but no balance deficits noted. Holding RUE with LUE    Balance Overall balance assessment: No apparent balance deficits (not formally  assessed)                                          ADL Overall ADL's : Needs assistance/impaired Eating/Feeding: Minimal assistance   Grooming: Wash/dry hands;Oral care;Supervision/safety;Standing Grooming Details (indicate cue type and reason): sink level ADL Upper Body Bathing: Minimal assistance Upper Body Bathing Details (indicate cue type and reason): Pt will benefit from seat to sit on for balance during bathing Lower Body Bathing: Minimal assistance   Upper Body Dressing : Moderate assistance;With caregiver independent assisting;Sitting Upper Body Dressing Details (indicate cue type and reason): to don bra, educated on types of clothing and sequencing Lower Body Dressing: Minimal assistance;Sit to/from stand Lower Body Dressing Details (indicate cue type and reason): donned underwear, pants Toilet Transfer: Min guard;Ambulation;Comfort height toilet;Grab bars Toilet Transfer Details (indicate cue type and reason): in bathroom Toileting- Clothing Manipulation and Hygiene: Supervision/safety Toileting - Clothing Manipulation Details (indicate cue type and reason): hospital gown     Functional mobility during ADLs: Min guard General ADL Comments: Pt experiencing nausea during session - got worse when in standing     Vision Baseline Vision/History: Wears glasses Patient Visual Report: No change from baseline Vision Assessment?: No apparent visual deficits     Perception     Praxis      Pertinent Vitals/Pain Pain Assessment: 0-10 Pain Score: 6  Pain Location: RUE Pain Descriptors / Indicators: Aching Pain Intervention(s): Monitored during session;Repositioned     Hand Dominance  Right   Extremity/Trunk Assessment Upper Extremity Assessment Upper Extremity Assessment: RUE deficits/detail RUE Deficits / Details: deficits in strength and ROM post-op   Lower Extremity Assessment Lower Extremity Assessment: Overall WFL for tasks assessed   Cervical  / Trunk Assessment Cervical / Trunk Assessment: Normal   Communication Communication Communication: No difficulties   Cognition Arousal/Alertness: Awake/alert Behavior During Therapy: WFL for tasks assessed/performed Overall Cognitive Status: Within Functional Limits for tasks assessed                     General Comments  Pt's granddaughter in room.    Exercises Exercises:  (Pt educated in moving fingers, elevation)     Shoulder Instructions      Home Living Family/patient expects to be discharged to:: Private residence Living Arrangements: Spouse/significant other Available Help at Discharge: Family;Available 24 hours/day Type of Home: House Home Access: Stairs to enter [employer] of Steps: 1 Entrance Stairs-Rails: None Home Layout: One level     Bathroom Shower/Tub: Occupational psychologist: Standard Bathroom Accessibility: Yes   Home Equipment: Environmental consultant - 2 wheels   Additional Comments: pt works part time at [employer]      Prior Functioning/Environment Level of Independence: Independent        Comments: pt's husband is going to [employer] for 3 days of testing and then possible surgery on Monday, family can help her intermittently while he's gone.         OT Problem List: Decreased strength;Decreased range of motion;Decreased activity tolerance;Impaired UE functional use;Pain      OT Treatment/Interventions:      OT Goals(Current goals can be found in the care plan section) Acute Rehab OT Goals Patient Stated Goal: return home today OT Goal Formulation: With patient Time For Goal Achievement: 02/02/17 Potential to Achieve Goals: Good  OT Frequency:     Barriers to D/C:            Co-evaluation              End of Session Equipment Utilized During Treatment: Gait belt Nurse Communication: Mobility status;Weight bearing status  Activity Tolerance: Patient tolerated treatment well;Other (comment) (nausea during  session) Patient left: in bed;with family/visitor present;with call bell/phone within reach  OT Visit Diagnosis: Pain Pain - Right/Left: Right Pain - part of body: Arm                ADL either performed or assessed with clinical judgement  Time: 1100-1132 OT Time Calculation (min): 32 min Charges:  OT General Charges $OT Visit: 1 Procedure OT Evaluation $OT Eval Low Complexity: 1 Procedure OT Treatments $Self Care/Home Management : 8-22 mins G-Codes: OT G-codes **NOT FOR INPATIENT CLASS** Functional Assessment Tool Used: AM-PAC 6 Clicks Daily Activity Functional Limitation: Self care Self Care Current Status (P8242): At least 40 percent but less than 60 percent impaired, limited or restricted Self Care Goal Status (P5361): At least 1 percent but less than 20 percent impaired, limited or restricted Self Care Discharge Status [phone]): At least 40 percent but less than 60 percent impaired, limited or restricted   Hulda Humphrey OTR/L [place] 01/26/2017, 6:01 PM

## 2017-01-26 NOTE — Anesthesia Postprocedure Evaluation (Addendum)
Anesthesia Post Note  Patient: Tabitha Adams  Procedure(s) Performed: Procedure(s) (LRB): OPEN REDUCTION INTERNAL FIXATION (ORIF) WRIST FRACTURE (Right)  Patient location during evaluation: PACU Anesthesia Type: Regional and MAC Level of consciousness: awake and alert Pain management: pain level controlled Vital Signs Assessment: post-procedure vital signs reviewed and stable Respiratory status: spontaneous breathing, nonlabored ventilation, respiratory function stable and patient connected to nasal cannula oxygen Cardiovascular status: blood pressure returned to baseline and stable Postop Assessment: no signs of nausea or vomiting Anesthetic complications: no       Last Vitals:  Vitals:   01/25/17 2026 01/26/17 0530  BP: 125/64 125/61  Pulse: 82 81  Resp: 18 18  Temp: 36.8 C 36.9 C    Last Pain:  Vitals:   01/26/17 0530  TempSrc: Oral  PainSc:                  Catalina Gravel

## 2017-01-26 NOTE — Evaluation (Signed)
Physical Therapy Evaluation Patient Details Name: Tabitha Adams MRN: [id number] DOB: 04-Jan-1946 Today's Date: 01/26/2017   History of Present Illness  71 y/o female fell on the ice and is now s/p ORIF comminuted complex intra- articular distal radius fracture with an Acumed T-plate dorsally along with an Acutrak screw in the radial styloid and associated stay graft bone graft; Right open carpal tunnel release; Closed treatment distal ulna fracture; Irrigation debridement of skin, subcutaneous tissue and muscle, 1.5 cm thenar laceration. Pt  has a past medical history including Anemia; Arthritis.  Clinical Impression  Patient evaluated by Physical Therapy with no further acute PT needs identified. All education has been completed and the patient has no further questions. Pt able to get in and out of bed and transfer to chair with mod I. Ambulation limited due to pt nausea. Advised pt to have family member stay with her at home first few nights as her husband will be out of town for the next week. See below for any follow-up Physical Therapy or equipment needs. PT is signing off. Thank you for this referral.     Follow Up Recommendations No PT follow up    Equipment Recommendations  None recommended by PT    Recommendations for Other Services       Precautions / Restrictions Precautions Precautions: Fall Restrictions Weight Bearing Restrictions: Yes RUE Weight Bearing: Non weight bearing      Mobility  Bed Mobility Overal bed mobility: Modified Independent             General bed mobility comments: pt able to get to EOB with increased time. Practiced twice  Transfers Overall transfer level: Modified independent Equipment used: None             General transfer comment: pt moving slowly but no balance deficits noted. Holding RUE with LUE  Ambulation/Gait             General Gait Details: only steps to chair because pt became nauseous  Stairs             Wheelchair Mobility    Modified Rankin (Stroke Patients Only)       Balance Overall balance assessment: No apparent balance deficits (not formally assessed)                                           Pertinent Vitals/Pain Pain Assessment: Faces Faces Pain Scale: Hurts little more Pain Location: RUE Pain Descriptors / Indicators: Aching Pain Intervention(s): Limited activity within patient's tolerance;Monitored during session;Repositioned    Home Living Family/patient expects to be discharged to:: Private residence Living Arrangements: Spouse/significant other Available Help at Discharge: Family;Available 24 hours/day Type of Home: House Home Access: Stairs to enter Entrance Stairs-Rails: None Entrance Stairs-Number of Steps: 1 Home Layout: One level Home Equipment: Walker - 2 wheels Additional Comments: pt works part time at [employer] Level of Independence: Independent         Comments: pt's husband is going to [employer] for 3 days of testing and then possible surgery on Monday, family can help her intermittently while he's gone.      Hand Dominance   Dominant Hand: Right    Extremity/Trunk Assessment   Upper Extremity Assessment Upper Extremity Assessment: Defer to OT evaluation    Lower Extremity Assessment Lower Extremity Assessment: Overall WFL for tasks assessed  Cervical / Trunk Assessment Cervical / Trunk Assessment: Normal  Communication   Communication: No difficulties  Cognition Arousal/Alertness: Awake/alert Behavior During Therapy: WFL for tasks assessed/performed Overall Cognitive Status: Within Functional Limits for tasks assessed                      General Comments General comments (skin integrity, edema, etc.): discussed proper propping of RUE and retrograde massage to fingers for edema control    Exercises     Assessment/Plan    PT Assessment Patent does not need any further PT  services  PT Problem List         PT Treatment Interventions      PT Goals (Current goals can be found in the Care Plan section)  Acute Rehab PT Goals Patient Stated Goal: return home today PT Goal Formulation: All assessment and education complete, DC therapy    Frequency     Barriers to discharge        Co-evaluation               End of Session   Activity Tolerance: Other (comment) (limited by nausea) Patient left: in chair;with call bell/phone within reach Nurse Communication: Mobility status PT Visit Diagnosis: Pain Pain - Right/Left: Right Pain - part of body: Arm    Functional Assessment Tool Used: AM-PAC 6 Clicks Basic Mobility Functional Limitation: Mobility: Walking and moving around Mobility: Walking and Moving Around Current Status (T0240): At least 20 percent but less than 40 percent impaired, limited or restricted Mobility: Walking and Moving Around Goal Status [phone]): At least 1 percent but less than 20 percent impaired, limited or restricted Mobility: Walking and Moving Around Discharge Status [phone]): At least 20 percent but less than 40 percent impaired, limited or restricted    Time: 0815-0845 PT Time Calculation (min) (ACUTE ONLY): 30 min   Charges:   PT Evaluation $PT Eval Low Complexity: 1 Procedure PT Treatments $Therapeutic Activity: 8-22 mins   PT G Codes:   PT G-Codes **NOT FOR INPATIENT CLASS** Functional Assessment Tool Used: AM-PAC 6 Clicks Basic Mobility Functional Limitation: Mobility: Walking and moving around Mobility: Walking and Moving Around Current Status (Q6834): At least 20 percent but less than 40 percent impaired, limited or restricted Mobility: Walking and Moving Around Goal Status [phone]): At least 1 percent but less than 20 percent impaired, limited or restricted Mobility: Walking and Moving Around Discharge Status [phone]): At least 20 percent but less than 40 percent impaired, limited or restricted   [place], PT  Acute Rehab Services  [place] 01/26/2017, 9:01 AM

## 2017-01-26 NOTE — Care Management (Signed)
Patient has no Home health needs. Ricki Miller, RN BSN Case Manager

## 2017-01-26 NOTE — Progress Notes (Signed)
Patient ID: Tabitha Adams, female   DOB: August 08, 1946, 71 y.o.   MRN: [id number]    LOS: 0 days   POD#1  Subjective: Doing well from hand standpoint, some throbbing but oxycodone worked well for her pain. Some lightheadedness when she got up this morning but it passed.   Objective: Vital signs in last 24 hours: Temp:  [97.7 F (36.5 C)-98.5 F (36.9 C)] 98.5 F (36.9 C) (03/14 0530) Pulse Rate:  [69-96] 81 (03/14 0530) Resp:  [8-25] 18 (03/14 0530) BP: (102-139)/(49-73) 125/61 (03/14 0530) SpO2:  [91 %-100 %] 98 % (03/14 0530) Weight:  [63.5 kg (140 lb)] 63.5 kg (140 lb) (03/13 1000)    Physical Exam General appearance: alert and no distress Extremities: RUE: Fingers warm, paresthesias resolved. Finger flexion WNL, extension impeded somewhat by edema, PROM WNL   Assessment/Plan: Right wrist fx s/p ORIF -- D/C home after OT evaluation, f/u with Dr. Amedeo Plenty in 1 week.    Lisette Abu, PA-C 01/26/2017

## 2017-01-26 NOTE — Discharge Summary (Signed)
Physician Discharge Summary  Patient ID: Tabitha Adams MRN: [id number] DOB/AGE: 71-Oct-1947 71 y.o.  Admit date: 01/25/2017 Discharge date: 01/26/2017  Discharge Diagnoses Patient Active Problem List   Diagnosis Date Noted  . Barton's fracture of right radius 01/25/2017  . Essential hypertension, benign 10/04/2016  . Generalized osteoarthritis of multiple sites 08/31/2016  . Chronic back pain 07/13/2016  . Hyperlipidemia 07/13/2016  . Allergic rhinitis 07/13/2016  . GERD (gastroesophageal reflux disease) 07/13/2016  . Chronic pain disorder 07/13/2016    Consultants None   Procedures 3/13 -- Open reduction and internal fixation of comminuted complex intra-articular distal radius fracture with an Acumed T-plate dorsally along with an Acutrak screw in the radial styloid and associated stay graft bone graft, four-view radiographic series, right wrist, right open carpal tunnel release, closed treatment distal ulna fracture, irrigation debridement of skin, subcutaneous tissue and muscle, 1.5cm thenar laceration, and simple repair, thenar laceration by Dr. Amedeo Plenty   HPI: [name] was starting to move her outdoor recycling container when she slipped on the ice and fell forward onto her right hand. The lid came down and struck her on the back of her head and addled her briefly but she did not lose consciousness. A neighbor helped her and she was brought to the ED for evaluation. X-rays showed a right wrist fracture. She was transferred to [facility] for operative treatment.   Hospital Course: The patient underwent ORIF of her wrist fracture without complication. The following day her pain was controlled with oral medication. She underwent an evaluation by occupational therapy and was discharged home in good condition.   Allergies as of 01/26/2017      Reactions   Sulfur Swelling, Other (See Comments)   Reaction:  All over body swelling    Penicillins Rash, Other (See Comments)   Has  patient had a PCN reaction causing immediate rash, facial/tongue/throat swelling, SOB or lightheadedness with hypotension: No Has patient had a PCN reaction causing severe rash involving mucus membranes or skin necrosis: No Has patient had a PCN reaction that required hospitalization No Has patient had a PCN reaction occurring within the last 10 years: No If all of the above answers are "NO", then may proceed with Cephalosporin use.      Medication List    TAKE these medications   ascorbic acid 1000 MG tablet Commonly known as:  VITAMIN C Take 1 tablet (1,000 mg total) by mouth daily.   b complex vitamins capsule Take 1 capsule by mouth daily.   benazepril 20 MG tablet Commonly known as:  LOTENSIN Take 1 tablet (20 mg total) by mouth daily.   cetirizine 10 MG tablet Commonly known as:  ZYRTEC Take 10 mg by mouth at bedtime.   ferrous sulfate 325 (65 FE) MG tablet Take 325 mg by mouth every Monday, Wednesday, and Friday.   fluticasone 50 MCG/ACT nasal spray Commonly known as:  FLONASE Place 1 spray into both nostrils 2 (two) times daily as needed for allergies or rhinitis.   glucosamine-chondroitin 500-400 MG tablet Take 1 tablet by mouth 2 (two) times daily.   meclizine 25 MG tablet Commonly known as:  ANTIVERT Take one half to one tablet three times a day for dizziness What changed:  how much to take  how to take this  when to take this  reasons to take this  additional instructions   meloxicam 15 MG tablet Commonly known as:  MOBIC TAKE 1 TABLET BY MOUTH DAILY What changed:  See the new  instructions.   montelukast 10 MG tablet Commonly known as:  SINGULAIR Take 1 tablet (10 mg total) by mouth at bedtime.   multivitamin with minerals Tabs tablet Take 1 tablet by mouth daily.   omega-3 acid ethyl esters 1 g capsule Commonly known as:  LOVAZA Take 1-2 g by mouth 2 (two) times daily. Pt takes one capsule in the morning and two at night.   omeprazole 20  MG capsule Commonly known as:  PRILOSEC Take 1 capsule (20 mg total) by mouth daily.   oxyCODONE 5 MG immediate release tablet Commonly known as:  Oxy IR/ROXICODONE Take 1-2 tablets (5-10 mg total) by mouth every 3 (three) hours as needed for moderate pain.   pravastatin 20 MG tablet Commonly known as:  PRAVACHOL Take 1 tablet (20 mg total) by mouth daily.   traMADol 50 MG tablet Commonly known as:  ULTRAM Take 1 tablet (50 mg total) by mouth at bedtime.       Follow-up Information    Paulene Floor, MD. Schedule an appointment as soon as possible for a visit in 1 week(s).   Specialty:  Orthopedic Surgery Contact information: [address] [place] [address] [place] [address] [phone]            Signed: Lisette Abu, PA-C 01/26/2017, 9:34 AM

## 2017-01-26 NOTE — Op Note (Signed)
NAME:  Tabitha Adams, Tabitha Adams                 ACCOUNT NO.:  MEDICAL RECORD NO.:  [id number]  LOCATION:                                 FACILITY:  PHYSICIAN:  Satira Anis. Dylan Ruotolo, M.D.     DATE OF BIRTH:  DATE OF PROCEDURE: DATE OF DISCHARGE:                              OPERATIVE REPORT   PREOPERATIVE DIAGNOSIS:  Comminuted complex dorsal shearing fracture (dorsal Barton's fracture) with radiocarpal dislocation and median nerve compression.  POSTOPERATIVE DIAGNOSIS:  Comminuted complex dorsal shearing fracture (dorsal Barton's fracture) with radiocarpal dislocation and median nerve compression.  PROCEDURES: 1. Open reduction and internal fixation of comminuted complex intra-     articular distal radius fracture with an Acumed T-plate dorsally     along with an Acutrak screw in the radial styloid and associated     stay graft bone graft. 2. Four-view radiographic series, right wrist. 3. Right open carpal tunnel release. 4. Closed treatment distal ulna fracture. 5. Irrigation debridement of skin, subcutaneous tissue and muscle, 1.5     cm thenar laceration. 6. Simple repair, thenar laceration.  SURGEON:  Satira Anis. Amedeo Plenty, M.D.  ASSISTANT:  Avelina Laine, PA-C.  COMPLICATIONS:  None.  ANESTHESIA:  Block with IV sedation.  TOURNIQUET TIME:  Less than 90 minutes.  INDICATIONS:  A 71 year old female with shearing fracture sustained today.  I was called just before 1:00 p.m.  subsequent to this, we prepped her for surgery.  She understands risks and benefits of surgery including risk of infection, bleeding, anesthesia, damage to normal structures, and failure of surgery to accomplish its intended goals of relieving symptoms and restoring function.  With this in mind, she and her family desired to proceed.  OPERATION IN DETAIL:  Dr. Lillia Abed placed a preoperative block in the holding area.  Following this, she was taken to the procedure suite. She was placed on the operative  table.  Prepped with Hibiclens scrub, followed by 10 minutes surgical Betadine scrub and paint.  Following this, outline marks were made visually and a reduction of the wrist occurred.  Time-out was called.  Preoperative vancomycin given.  At this juncture, I reduced the wrist.  I then reassembled with palpation the radiocarpal joint.  The patient at this time had an incision made over the radial styloid.  Dissection was carried down and a guidewire from mini Acutrak screw was placed.  Following this, I performed reduction to my satisfaction and placed a 30 mm mini Acutrak screw without difficulty achieving excellent purchase.  Following this, I placed the patient through a range of motion.  Unfortunately the die punch portion of her fracture necessitated additional ORIF measures. Thus, I made an incision with her in finger trap traction and dissected down very carefully as Z length in the extensor retinaculum dissected down, swept the tendons out of harm's way, and then performed placement of StaGraft bone graft, followed by a T-plate to be used this spring plate and buttressing plate.  This achieved adequate reduction.  Screws were placed without difficulty and all looked well.  I checked this under AP, lateral, and oblique x-rays.  The distal radioulnar joint was stable.  There was no tendency  towards re-displacement.  No complicating features.  The distal ulna styloid fracture was very carefully protected and at this time the x-rays revealed that it would be adequately managed with closed treatment.  Thus, at this juncture, we irrigated copiously and closed the wounds.  Following this, we performed open carpal tunnel release with a 1-inch incision of the carpal canal.  Dissection was carried down.  Transcarpal ligament was released in its entirety.  There were no complicating features.  Following this, the patient then underwent a very careful and cautious approach to the carpal  tunnel with visualization of the decompression proximally and distally.  I should note, fat pad egressed distally.  Proximally, the antebrachial fascia and of course the proximal leaflet of transverse carpal ligament were released.  The median nerve was intact, hyperemic, and had evidence of likely some degree of contused live and compressive phenomenon to it.  We irrigated copiously and closed all wounds.  I should note the patient did have a 1.5 cm laceration of the thenar eminence and this was treated with irrigation debridement and closure of the wound and a 0.5 cm wound at the initiation of the operative procedure.  The patient will be admitted for IV antibiotics and general postoperative observation.     Satira Anis. Amedeo Plenty, M.D.     [facility]  D:  01/25/2017  T:  01/25/2017  Job:  [id number]

## 2017-01-26 NOTE — Discharge Instructions (Signed)
Keep splint intact and dry.  No driving until cleared by Dr. Amedeo Plenty.  No lifting with right arm.

## 2017-01-27 NOTE — Anesthesia Postprocedure Evaluation (Signed)
Anesthesia Post Note  Patient: Tabitha Adams  Procedure(s) Performed: Procedure(s) (LRB): OPEN REDUCTION INTERNAL FIXATION (ORIF) WRIST FRACTURE (Right)  Patient location during evaluation: PACU Anesthesia Type: Regional and MAC Level of consciousness: awake and alert and patient cooperative Pain management: pain level controlled Vital Signs Assessment: post-procedure vital signs reviewed and stable Respiratory status: spontaneous breathing and respiratory function stable Cardiovascular status: stable Anesthetic complications: no       Last Vitals:  Vitals:   01/25/17 2026 01/26/17 0530  BP: 125/64 125/61  Pulse: 82 81  Resp: 18 18  Temp: 36.8 C 36.9 C    Last Pain:  Vitals:   01/26/17 0530  TempSrc: Oral  PainSc:                  Demetress Tift DAVID

## 2017-01-27 NOTE — Addendum Note (Signed)
Addendum  created 01/27/17 1437 by Lillia Abed, MD   Sign clinical note

## 2017-01-29 ENCOUNTER — Other Ambulatory Visit: Payer: Self-pay | Admitting: Family Medicine

## 2017-01-29 DIAGNOSIS — M549 Dorsalgia, unspecified: Principal | ICD-10-CM

## 2017-01-29 DIAGNOSIS — M159 Polyosteoarthritis, unspecified: Secondary | ICD-10-CM

## 2017-01-29 DIAGNOSIS — G8929 Other chronic pain: Secondary | ICD-10-CM

## 2017-01-29 DIAGNOSIS — G894 Chronic pain syndrome: Secondary | ICD-10-CM

## 2017-02-04 MED ORDER — TRAMADOL HCL 50 MG PO TABS: 50.0000 mg | ORAL_TABLET | Freq: Every day | ORAL | 1 refills | Status: DC

## 2017-02-04 NOTE — Telephone Encounter (Signed)
In 06/2016 I gave her a Rx for Tramadol for chronic pain.Noted that se received a Rx for Oxycodone from another provider. Is she still taking Oxycodone?  Meloxicam 15 mg sent. Thanks, BJ

## 2017-02-04 NOTE — Telephone Encounter (Addendum)
The Oxycodone is from the hospital, patient had a fracture & then had surgery.

## 2017-02-04 NOTE — Addendum Note (Signed)
Addended by: Kateri Mc E on: 02/04/2017 02:27 PM   Modules accepted: Orders

## 2017-02-04 NOTE — Telephone Encounter (Signed)
Tramadol 50 mg to continue 1 tab at bedtime as needed can be called in # 30/1. Thanks, BJ

## 2017-02-04 NOTE — Telephone Encounter (Signed)
Rx called in 

## 2017-02-07 DIAGNOSIS — S52561D Barton's fracture of right radius, subsequent encounter for closed fracture with routine healing: Secondary | ICD-10-CM | POA: Diagnosis not present

## 2017-02-07 DIAGNOSIS — Z4789 Encounter for other orthopedic aftercare: Secondary | ICD-10-CM | POA: Diagnosis not present

## 2017-02-23 DIAGNOSIS — M25531 Pain in right wrist: Secondary | ICD-10-CM | POA: Diagnosis not present

## 2017-02-23 DIAGNOSIS — Z4789 Encounter for other orthopedic aftercare: Secondary | ICD-10-CM | POA: Diagnosis not present

## 2017-03-01 ENCOUNTER — Ambulatory Visit: Payer: Medicare Other | Admitting: Family Medicine

## 2017-03-01 ENCOUNTER — Encounter: Payer: Medicare Other | Admitting: Vascular Surgery

## 2017-03-01 DIAGNOSIS — M25631 Stiffness of right wrist, not elsewhere classified: Secondary | ICD-10-CM | POA: Diagnosis not present

## 2017-03-02 ENCOUNTER — Telehealth: Payer: Self-pay

## 2017-03-02 DIAGNOSIS — G894 Chronic pain syndrome: Secondary | ICD-10-CM

## 2017-03-02 DIAGNOSIS — E785 Hyperlipidemia, unspecified: Secondary | ICD-10-CM

## 2017-03-02 DIAGNOSIS — M159 Polyosteoarthritis, unspecified: Secondary | ICD-10-CM

## 2017-03-02 MED ORDER — MELOXICAM 15 MG PO TABS: 15.0000 mg | ORAL_TABLET | Freq: Every day | ORAL | 0 refills | Status: DC

## 2017-03-02 MED ORDER — PRAVASTATIN SODIUM 20 MG PO TABS: 20.0000 mg | ORAL_TABLET | Freq: Every day | ORAL | 1 refills | Status: DC

## 2017-03-02 NOTE — Telephone Encounter (Signed)
Patient requesting refill of Tramadol to be sent to the mail order, [facility].

## 2017-03-04 DIAGNOSIS — M25631 Stiffness of right wrist, not elsewhere classified: Secondary | ICD-10-CM | POA: Diagnosis not present

## 2017-03-04 NOTE — Telephone Encounter (Signed)
It seems like a Rx for Tramadol was called in 02/04/17 with a refill. Reviewing [place] controlled substance web I do not see it on the report,so I assumed she did not fill Rx.  We can call in same Rx to her mail order pharmacy and cancel the one is at her local pharmacy.  Thanks, BJ

## 2017-03-04 NOTE — Telephone Encounter (Signed)
Message sent to patient to see if she wants to continue to get it locally or have it moved to mail order.

## 2017-03-07 NOTE — Telephone Encounter (Signed)
Patient coming in 4/25 @ 8:30, will discuss then.   Patient does have medication available at local pharmacy if needed.

## 2017-03-08 NOTE — Progress Notes (Signed)
HPI:   [name] is a 71 y.o. female, who is here today to follow on some chronic medical problems.  Last seen on 12/10/16 for acute visit.  Since her last OV she had open reduction and internal fixation of commuted radius fracture (01/25/17), she stayed hospitalized overnight. Her husband has also been admitted due to complications after surgical procedure,now he is in inpatient rehab. She feels like she is dealing well with stress.   HTN: Currently she is on Benicar 20 mg daily. Denies severe/frequent headache, visual changes, chest pain, dyspnea, palpitation, claudication, focal weakness, or edema. Last eye exam: within a year ago.  She does not check BP at home. Lab Results  Component Value Date   CREATININE 0.80 01/25/2017   BUN 20 01/25/2017   NA 135 01/25/2017   K 4.1 01/25/2017   CL 104 01/25/2017   CO2 24 01/25/2017   Generalized OA:  Affected joints: IP of hands, knees,feet, and back. Currently she is on Tramadol 50 mg, which she takes at bedtime. Also taking Mobic 15 mg daily as needed.  Pain level: 5-6/10 Pain is exacerbated by movement, prolonged standing and walking. Alleviated by rest.  Tolerating medications well,no side effects reported.   Hyperlipidemia:  Currently on Pravastatin 20 mg daily. Following a low fat diet: Yes.  She has not noted side effects with medication.  Lab Results  Component Value Date   CHOL 217 (A) 06/11/2016   HDL 53 06/11/2016   LDLCALC 141 06/11/2016   TRIG 113 06/11/2016   CHOLHDL 4.4 CALC 12/12/2006   Recent labs done, pre op evaluation. Denies gross hematuria.  Denies abdominal pain, nausea, vomiting, changes in bowel habits, blood in stool or melena.  Lab Results  Component Value Date   WBC 11.8 (H) 01/25/2017   HGB 11.1 (L) 01/25/2017   HCT 31.9 (L) 01/25/2017   MCV 82.6 01/25/2017   PLT 244 01/25/2017    Colonoscopy 1-2 years ago,per pt report. She is on Fe Sulfate 325 mg 3 times  per week. GERD: On Omeprazole 20 mg. Symptoms well controlled.  Noted mild hyperglycemia, 01/25/17 GLU 117.  No new concerns today. Needs medications send to mail order.    Review of Systems  Constitutional: Positive for fatigue. Negative for appetite change, fever and unexpected weight change.  HENT: Negative for mouth sores, nosebleeds and trouble swallowing.   Eyes: Negative for redness and visual disturbance.  Respiratory: Negative for cough, shortness of breath and wheezing.   Cardiovascular: Negative for chest pain, palpitations and leg swelling.  Gastrointestinal: Negative for abdominal pain, blood in stool, nausea and vomiting.       Negative for changes in bowel habits.  Endocrine: Negative for polydipsia, polyphagia and polyuria.  Genitourinary: Negative for decreased urine volume, frequency and hematuria.  Musculoskeletal: Positive for arthralgias and back pain. Negative for gait problem.  Skin: Negative for pallor and rash.  Allergic/Immunologic: Positive for environmental allergies.  Neurological: Negative for syncope, weakness and headaches.  Hematological: Negative for adenopathy. Does not bruise/bleed easily.  Psychiatric/Behavioral: Negative for confusion. The patient is nervous/anxious.       Current Outpatient Prescriptions on File Prior to Visit  Medication Sig Dispense Refill  . b complex vitamins capsule Take 1 capsule by mouth daily.    . cetirizine (ZYRTEC) 10 MG tablet Take 10 mg by mouth at bedtime.    . ferrous sulfate 325 (65 FE) MG tablet Take 325 mg by mouth every Monday, Wednesday, and  Friday.    . fluticasone (FLONASE) 50 MCG/ACT nasal spray Place 1 spray into both nostrils 2 (two) times daily as needed for allergies or rhinitis. 16 g 3  . glucosamine-chondroitin 500-400 MG tablet Take 1 tablet by mouth 2 (two) times daily.    . meclizine (ANTIVERT) 25 MG tablet Take one half to one tablet three times a day for dizziness (Patient taking differently:  Take 12.5-25 mg by mouth 3 (three) times daily as needed for dizziness. ) 30 tablet 0  . Multiple Vitamin (MULTIVITAMIN WITH MINERALS) TABS tablet Take 1 tablet by mouth daily.    Marland Kitchen omega-3 acid ethyl esters (LOVAZA) 1 g capsule Take 1-2 g by mouth 2 (two) times daily. Pt takes one capsule in the morning and two at night.    . pravastatin (PRAVACHOL) 20 MG tablet Take 1 tablet (20 mg total) by mouth daily. 90 tablet 1  . vitamin C (VITAMIN C) 1000 MG tablet Take 1 tablet (1,000 mg total) by mouth daily. 30 tablet 0   No current facility-administered medications on file prior to visit.      Past Medical History:  Diagnosis Date  . Allergy   . Anemia   . Arthritis   . GERD (gastroesophageal reflux disease)   . Hyperlipidemia    Allergies  Allergen Reactions  . Sulfur Swelling and Other (See Comments)    Reaction:  All over body swelling   . Penicillins Rash and Other (See Comments)    Has patient had a PCN reaction causing immediate rash, facial/tongue/throat swelling, SOB or lightheadedness with hypotension: No Has patient had a PCN reaction causing severe rash involving mucus membranes or skin necrosis: No Has patient had a PCN reaction that required hospitalization No Has patient had a PCN reaction occurring within the last 10 years: No If all of the above answers are "NO", then may proceed with Cephalosporin use.    Social History   Social History  . Marital status: Married    Spouse name: N/A  . Number of children: N/A  . Years of education: N/A   Social History Main Topics  . Smoking status: Former Research scientist (life sciences)  . Smokeless tobacco: Never Used  . Alcohol use No  . Drug use: No  . Sexual activity: Not Asked   Other Topics Concern  . None   Social History Narrative  . None    Vitals:   03/09/17 0821  BP: 120/70  Pulse: 80  Resp: 12  O2 sat at RA 98% Body mass index is 27.57 kg/m.   Physical Exam  Nursing note and vitals reviewed. Constitutional: She is  oriented to person, place, and time. She appears well-developed. No distress.  HENT:  Head: Atraumatic.  Mouth/Throat: Oropharynx is clear and moist and mucous membranes are normal.  Eyes: Conjunctivae and EOM are normal. Pupils are equal, round, and reactive to light.  Cardiovascular: Normal rate and regular rhythm.   No murmur heard. Pulses:      Dorsalis pedis pulses are 2+ on the right side, and 2+ on the left side.  Respiratory: Effort normal and breath sounds normal. No respiratory distress.  GI: Soft. She exhibits no mass. There is no hepatomegaly. There is no tenderness.  Musculoskeletal: She exhibits no edema or tenderness.  Right wrist cast. Shoulders with adequate ROM. No tenderness upon palpation of paraspinal muscles.    Lymphadenopathy:    She has no cervical adenopathy.  Neurological: She is alert and oriented to person, place, and time.  She has normal strength. Gait normal.  Skin: Skin is warm. No rash noted. No erythema.  Psychiatric: She has a normal mood and affect.  Well groomed, good eye contact.      ASSESSMENT AND PLAN:   Alicya was seen today for follow-up.  Diagnoses and all orders for this visit:  Lab Results  Component Value Date   CHOL 207 (H) 03/09/2017   HDL 50.00 03/09/2017   LDLCALC 132 (H) 03/09/2017   TRIG 125.0 03/09/2017   CHOLHDL 4 03/09/2017   Lab Results  Component Value Date   HGBA1C 5.6 03/09/2017    Generalized osteoarthritis of multiple sites  Stable. No changes in current management. Side effects of NSAID's discussed.  F/U in 5 months.  -     meloxicam (MOBIC) 15 MG tablet; Take 1 tablet (15 mg total) by mouth daily. -     traMADol (ULTRAM) 50 MG tablet; Take 1 tablet (50 mg total) by mouth at bedtime.  Chronic pain disorder  Pain is well controlled when taking medications, well tolerated. Some side effects discussed.  -     meloxicam (MOBIC) 15 MG tablet; Take 1 tablet (15 mg total) by mouth daily. -      traMADol (ULTRAM) 50 MG tablet; Take 1 tablet (50 mg total) by mouth at bedtime.  Essential hypertension, benign  Adequately controlled. No changes in current management. DASH diet recommended. Eye exam recommended annually. F/U in 5 months, before if needed.  -     benazepril (LOTENSIN) 20 MG tablet; Take 1 tablet (20 mg total) by mouth daily.  Anemia, unspecified type  Mild. Will re-check next OV. No changes in current management. Colonoscopy reported as current.   Hyperlipidemia, unspecified hyperlipidemia type No changes in current management, will follow labs done today and will give further recommendations accordingly. F/U in 6 months.  -     Lipid panel  Allergic rhinitis, unspecified seasonality, unspecified trigger  Well controlled.  No changes in current management. F/U in 12 months.  -     montelukast (SINGULAIR) 10 MG tablet; Take 1 tablet (10 mg total) by mouth at bedtime.  Gastroesophageal reflux disease without esophagitis  Stable. No changes in current management. F/U in 6-12 months.  -     omeprazole (PRILOSEC) 20 MG capsule; Take 1 capsule (20 mg total) by mouth daily.  Hyperglycemia  Further recommendations will be given according to lab results.  -     Hemoglobin A1c     -[name] was advised to return sooner than planned today if new concerns arise.       Reeves Musick G. [name], MD  [facility]. [place] office.

## 2017-03-09 ENCOUNTER — Ambulatory Visit (INDEPENDENT_AMBULATORY_CARE_PROVIDER_SITE_OTHER): Payer: Medicare Other | Admitting: Family Medicine

## 2017-03-09 ENCOUNTER — Encounter: Payer: Self-pay | Admitting: Family Medicine

## 2017-03-09 VITALS — BP 120/70 | HR 80 | Resp 12 | Ht 59.0 in | Wt 136.5 lb

## 2017-03-09 DIAGNOSIS — R739 Hyperglycemia, unspecified: Secondary | ICD-10-CM

## 2017-03-09 DIAGNOSIS — I10 Essential (primary) hypertension: Secondary | ICD-10-CM

## 2017-03-09 DIAGNOSIS — K219 Gastro-esophageal reflux disease without esophagitis: Secondary | ICD-10-CM

## 2017-03-09 DIAGNOSIS — M159 Polyosteoarthritis, unspecified: Secondary | ICD-10-CM

## 2017-03-09 DIAGNOSIS — G894 Chronic pain syndrome: Secondary | ICD-10-CM

## 2017-03-09 DIAGNOSIS — E785 Hyperlipidemia, unspecified: Secondary | ICD-10-CM

## 2017-03-09 DIAGNOSIS — J309 Allergic rhinitis, unspecified: Secondary | ICD-10-CM

## 2017-03-09 DIAGNOSIS — M25631 Stiffness of right wrist, not elsewhere classified: Secondary | ICD-10-CM | POA: Diagnosis not present

## 2017-03-09 DIAGNOSIS — D649 Anemia, unspecified: Secondary | ICD-10-CM | POA: Diagnosis not present

## 2017-03-09 LAB — LIPID PANEL
CHOLESTEROL: 207 mg/dL — AB (ref 0–200)
HDL: 50 mg/dL (ref >39.00)
LDL Cholesterol: 132 mg/dL — ABNORMAL HIGH (ref 0–99)
NonHDL: 157.42
TRIGLYCERIDES: 125 mg/dL (ref 0.0–149.0)
Total CHOL/HDL Ratio: 4
VLDL: 25 mg/dL (ref 0.0–40.0)

## 2017-03-09 LAB — HEMOGLOBIN A1C: HEMOGLOBIN A1C: 5.6 % (ref 4.6–6.5)

## 2017-03-09 MED ORDER — MELOXICAM 15 MG PO TABS: 15.0000 mg | ORAL_TABLET | Freq: Every day | ORAL | 1 refills | Status: DC

## 2017-03-09 MED ORDER — MONTELUKAST SODIUM 10 MG PO TABS: 10.0000 mg | ORAL_TABLET | Freq: Every day | ORAL | 3 refills | Status: DC

## 2017-03-09 MED ORDER — TRAMADOL HCL 50 MG PO TABS: 50.0000 mg | ORAL_TABLET | Freq: Every day | ORAL | 2 refills | Status: DC

## 2017-03-09 MED ORDER — OMEPRAZOLE 20 MG PO CPDR: 20.0000 mg | DELAYED_RELEASE_CAPSULE | Freq: Every day | ORAL | 3 refills | Status: DC

## 2017-03-09 MED ORDER — BENAZEPRIL HCL 20 MG PO TABS: 20.0000 mg | ORAL_TABLET | Freq: Every day | ORAL | 2 refills | Status: DC

## 2017-03-09 NOTE — Patient Instructions (Signed)
A few things to remember from today's visit:   Generalized osteoarthritis of multiple sites - Plan: meloxicam (MOBIC) 15 MG tablet  Essential hypertension, benign - Plan: benazepril (LOTENSIN) 20 MG tablet  Hyperlipidemia, unspecified hyperlipidemia type - Plan: Lipid panel  Chronic pain disorder - Plan: meloxicam (MOBIC) 15 MG tablet  Allergic rhinitis, unspecified seasonality, unspecified trigger  Gastroesophageal reflux disease without esophagitis - Plan: omeprazole (PRILOSEC) 20 MG capsule  Hyperglycemia - Plan: Hemoglobin A1c   Caution with falls. Blood pressure goal for most people is less than 140/90. Some populations (older than 60) the goal is less than 150/90.  Most recent cardiologists' recommendations recommend blood pressure at or less than 130/80.   Elevated blood pressure increases the risk of strokes, heart and kidney disease, and eye problems. Regular physical activity and a healthy diet (DASH diet) usually help. Low salt diet. Take medications as instructed.  Caution with some over the counter medications as cold medications, dietary products (for weight loss), and Ibuprofen or Aleve (frequent use);all these medications could cause elevation of blood pressure.    Please be sure medication list is accurate. If a new problem present, please set up appointment sooner than planned today.

## 2017-03-09 NOTE — Progress Notes (Signed)
Pre visit review using our clinic review tool, if applicable. No additional management support is needed unless otherwise documented below in the visit note. 

## 2017-03-13 ENCOUNTER — Encounter: Payer: Self-pay | Admitting: Family Medicine

## 2017-03-15 DIAGNOSIS — M25631 Stiffness of right wrist, not elsewhere classified: Secondary | ICD-10-CM | POA: Diagnosis not present

## 2017-03-21 DIAGNOSIS — Z4789 Encounter for other orthopedic aftercare: Secondary | ICD-10-CM | POA: Diagnosis not present

## 2017-03-21 DIAGNOSIS — M25631 Stiffness of right wrist, not elsewhere classified: Secondary | ICD-10-CM | POA: Diagnosis not present

## 2017-03-22 DIAGNOSIS — M25631 Stiffness of right wrist, not elsewhere classified: Secondary | ICD-10-CM | POA: Diagnosis not present

## 2017-04-01 DIAGNOSIS — M25631 Stiffness of right wrist, not elsewhere classified: Secondary | ICD-10-CM | POA: Diagnosis not present

## 2017-04-08 DIAGNOSIS — M25631 Stiffness of right wrist, not elsewhere classified: Secondary | ICD-10-CM | POA: Diagnosis not present

## 2017-04-15 DIAGNOSIS — M25631 Stiffness of right wrist, not elsewhere classified: Secondary | ICD-10-CM | POA: Diagnosis not present

## 2017-04-18 DIAGNOSIS — Z4789 Encounter for other orthopedic aftercare: Secondary | ICD-10-CM | POA: Diagnosis not present

## 2017-04-18 DIAGNOSIS — S52691D Other fracture of lower end of right ulna, subsequent encounter for closed fracture with routine healing: Secondary | ICD-10-CM | POA: Diagnosis not present

## 2017-05-12 ENCOUNTER — Other Ambulatory Visit: Payer: Self-pay | Admitting: Family Medicine

## 2017-05-12 DIAGNOSIS — E785 Hyperlipidemia, unspecified: Secondary | ICD-10-CM

## 2017-06-02 ENCOUNTER — Other Ambulatory Visit: Payer: Self-pay | Admitting: Adult Health

## 2017-06-02 NOTE — Telephone Encounter (Signed)
Ok to refill 30 + 3

## 2017-06-03 ENCOUNTER — Encounter: Payer: Self-pay | Admitting: Family Medicine

## 2017-06-03 ENCOUNTER — Ambulatory Visit (INDEPENDENT_AMBULATORY_CARE_PROVIDER_SITE_OTHER): Payer: Medicare Other | Admitting: Family Medicine

## 2017-06-03 ENCOUNTER — Ambulatory Visit (INDEPENDENT_AMBULATORY_CARE_PROVIDER_SITE_OTHER)
Admission: RE | Admit: 2017-06-03 | Discharge: 2017-06-03 | Disposition: A | Discharge status: Home / Self Care | Payer: Medicare Other | Source: Ambulatory Visit | Attending: Family Medicine | Admitting: Family Medicine

## 2017-06-03 VITALS — BP 124/72 | HR 90 | Resp 12 | Ht 59.0 in | Wt 140.0 lb

## 2017-06-03 DIAGNOSIS — R05 Cough: Secondary | ICD-10-CM | POA: Diagnosis not present

## 2017-06-03 DIAGNOSIS — I10 Essential (primary) hypertension: Secondary | ICD-10-CM | POA: Diagnosis not present

## 2017-06-03 DIAGNOSIS — R0781 Pleurodynia: Secondary | ICD-10-CM

## 2017-06-03 DIAGNOSIS — R053 Chronic cough: Secondary | ICD-10-CM

## 2017-06-03 MED ORDER — LIDOCAINE 4 % EX CREA: 1.0000 "application " | TOPICAL_CREAM | Freq: Three times a day (TID) | CUTANEOUS | 0 refills | Status: AC | PRN

## 2017-06-03 MED ORDER — VALACYCLOVIR HCL 1 G PO TABS: 1000.0000 mg | ORAL_TABLET | Freq: Two times a day (BID) | ORAL | 0 refills | Status: DC

## 2017-06-03 MED ORDER — LOSARTAN POTASSIUM 50 MG PO TABS: 50.0000 mg | ORAL_TABLET | Freq: Every day | ORAL | 0 refills | Status: DC

## 2017-06-03 MED ORDER — VALACYCLOVIR HCL 1 G PO TABS: 1000.0000 mg | ORAL_TABLET | Freq: Three times a day (TID) | ORAL | 0 refills | Status: AC

## 2017-06-03 MED ORDER — LIDOCAINE 4 % EX CREA: 1.0000 "application " | TOPICAL_CREAM | Freq: Three times a day (TID) | CUTANEOUS | 0 refills | Status: DC | PRN

## 2017-06-03 NOTE — Patient Instructions (Signed)
A few things to remember from today's visit:   Costal margin pain - Plan: DG Ribs Unilateral W/Chest Right, lidocaine (LMX) 4 % cream  Essential hypertension, benign - Plan: losartan (COZAAR) 50 MG tablet  Cough, persistent  Monitor for rash on area of pain.  Monitor blood pressure at home. Benazepril can be causing causing,so changed to losartan.   Please be sure medication list is accurate. If a new problem present, please set up appointment sooner than planned today.

## 2017-06-03 NOTE — Progress Notes (Addendum)
ACUTE VISIT   HPI:  Chief Complaint  Patient presents with  . right rib    pain    Tabitha Adams is a 71 y.o. female, who is here today complaining of 3 weeks of right rib cage constant pain.  She denies any recent injury or unusual level of activity but she is taking care HER husband who has unstable gait.  Pain is not radiated, sharp/burning like, 5/10 in intensity.  She has Hx of lower back pain but has ben stable, no radiated, with no associated numbness, tingling, urinary incontinence or retention, stool incontinence, or saddle anesthesia.  Exacerbated by deep breathing,pulling, and palpation. She has not identified alleviating factors.  No rash or edema on area, fever, chills, or abnormal wt loss.   OTC medications: None   Other  The current episode started 1 to 4 weeks ago. The problem occurs constantly. The problem has been unchanged. Associated symptoms include coughing. Pertinent negatives include no abdominal pain, anorexia, arthralgias, change in bowel habit, chest pain, chills, congestion, diaphoresis, fatigue, fever, headaches, nausea, neck pain, numbness, rash, sore throat, swollen glands, urinary symptoms, vomiting or weakness. She has tried nothing for the symptoms.    She is also c/o non productive cough, which she has had for a while, even before she moved to [place]. She denies dyspnea,wheezing, or chest pain. History of hypertension, she is currently on benazepril 20 mg daily.  Hx of GERD but has not had symptoms, she is on Omeprazole.  Denies abdominal pain, nausea, vomiting, changes in bowel habits, blood in stool or melena.    Review of Systems  Constitutional: Negative for activity change, appetite change, chills, diaphoresis, fatigue, fever and unexpected weight change.  HENT: Negative for congestion, mouth sores, sore throat and trouble swallowing.   Respiratory: Positive for cough. Negative for shortness of breath and  wheezing.   Cardiovascular: Negative for chest pain, palpitations and leg swelling.  Gastrointestinal: Negative for abdominal pain, anorexia, blood in stool, change in bowel habit, nausea and vomiting.  Genitourinary: Negative for dysuria, frequency and hematuria.  Musculoskeletal: Negative for arthralgias, back pain and neck pain.  Skin: Negative for rash.  Allergic/Immunologic: Positive for environmental allergies.  Neurological: Negative for weakness, numbness and headaches.  Hematological: Negative for adenopathy. Does not bruise/bleed easily.  Psychiatric/Behavioral: Negative for confusion.      Current Outpatient Prescriptions on File Prior to Visit  Medication Sig Dispense Refill  . b complex vitamins capsule Take 1 capsule by mouth daily.    . cetirizine (ZYRTEC) 10 MG tablet Take 10 mg by mouth at bedtime.    . ferrous sulfate 325 (65 FE) MG tablet Take 325 mg by mouth every Monday, Wednesday, and Friday.    . fluticasone (FLONASE) 50 MCG/ACT nasal spray Place 1 spray into both nostrils 2 (two) times daily as needed for allergies or rhinitis. 16 g 3  . glucosamine-chondroitin 500-400 MG tablet Take 1 tablet by mouth 2 (two) times daily.    . montelukast (SINGULAIR) 10 MG tablet Take 1 tablet (10 mg total) by mouth at bedtime. 90 tablet 3  . Multiple Vitamin (MULTIVITAMIN WITH MINERALS) TABS tablet Take 1 tablet by mouth daily.    Marland Kitchen omega-3 acid ethyl esters (LOVAZA) 1 g capsule Take 1-2 g by mouth 2 (two) times daily. Pt takes one capsule in the morning and two at night.    Marland Kitchen omeprazole (PRILOSEC) 20 MG capsule Take 1 capsule (20 mg total) by mouth daily.  90 capsule 3  . pravastatin (PRAVACHOL) 20 MG tablet TAKE 1 TABLET BY MOUTH  DAILY 90 tablet 1  . traMADol (ULTRAM) 50 MG tablet Take 1 tablet (50 mg total) by mouth at bedtime. 30 tablet 2  . vitamin C (VITAMIN C) 1000 MG tablet Take 1 tablet (1,000 mg total) by mouth daily. 30 tablet 0  . meclizine (ANTIVERT) 25 MG tablet Take  one half to one tablet three times a day for dizziness (Patient not taking: Reported on 06/03/2017) 30 tablet 0   No current facility-administered medications on file prior to visit.      Past Medical History:  Diagnosis Date  . Allergy   . Anemia   . Arthritis   . GERD (gastroesophageal reflux disease)   . Hyperlipidemia    Allergies  Allergen Reactions  . Sulfur Swelling and Other (See Comments)    Reaction:  All over body swelling   . Penicillins Rash and Other (See Comments)    Has patient had a PCN reaction causing immediate rash, facial/tongue/throat swelling, SOB or lightheadedness with hypotension: No Has patient had a PCN reaction causing severe rash involving mucus membranes or skin necrosis: No Has patient had a PCN reaction that required hospitalization No Has patient had a PCN reaction occurring within the last 10 years: No If all of the above answers are "NO", then may proceed with Cephalosporin use.    Social History   Social History  . Marital status: Married    Spouse name: N/A  . Number of children: N/A  . Years of education: N/A   Social History Main Topics  . Smoking status: Former Research scientist (life sciences)  . Smokeless tobacco: Never Used  . Alcohol use No  . Drug use: No  . Sexual activity: Not Asked   Other Topics Concern  . None   Social History Narrative  . None    Vitals:   06/03/17 1352  BP: 124/72  Pulse: 90  Resp: 12  O2 sat at RA 97% Body mass index is 28.28 kg/m.   Physical Exam  Nursing note and vitals reviewed. Constitutional: She is oriented to person, place, and time. She appears well-developed. No distress.  HENT:  Head: Atraumatic.  Mouth/Throat: Oropharynx is clear and moist and mucous membranes are normal.  Eyes: Conjunctivae are normal.  Cardiovascular: Normal rate and regular rhythm.   No murmur heard. Respiratory: Effort normal and breath sounds normal. No respiratory distress.  GI: Soft. She exhibits no mass. There is no  hepatomegaly. There is no tenderness.  Musculoskeletal: She exhibits no edema.  Pain upon light touch of right false rib, following dermatoma, T8. No deformity,crepitus, or edema appreciated.  Lymphadenopathy:    She has no cervical adenopathy.  Neurological: She is alert and oriented to person, place, and time. She has normal strength. She displays tremor (reported as stable). Gait normal.  Skin: Skin is warm. No rash noted. No erythema.  Psychiatric: She has a normal mood and affect.  Well groomed, good eye contact.     ASSESSMENT AND PLAN:   Tabitha Adams was seen today for right rib.  Diagnoses and all orders for this visit:  Costal margin pain  We discussed possible causes, most likely musculoskeletal.   She states that pain is burning and on exam tender upon light touch, zoster infection can be preceded by similar symptoms. So I sent Rx for Valtrex in case she noted rash on affected area. She understands this medication is not to treat pain  but to take in case rash appears. Topical lidocaine recommended. Further recommendations would be given according to imaging results. Instructed about warning signs.   -     DG Ribs Unilateral W/Chest Right; Future -     lidocaine (LMX) 4 % cream; Apply 1 application topically 3 (three) times daily as needed.  Cough, persistent  Seems to be chronic. GERD and allergies, please contribute to problem. She agrees with changing Benazepril for Losartan.  Essential hypertension, benign  Well controlled but because Benazepril may be causing cough, Benazepril was discontinued and Losartan 50 mg started. Continue monitoring BP at home. Follow-up in 2 months.  -     losartan (COZAAR) 50 MG tablet; Take 1 tablet (50 mg total) by mouth daily.  Other orders -     valACYclovir (VALTREX) 1000 MG tablet; Take 1 tablet (1,000 mg total) by mouth 3 (three) times daily.    -Tabitha Adams was advised to seek immediate medical attention if  sudden worsening symptoms, otherwise I will see her back in 2 months.      Malisha Mabey G. [name], MD  [facility]. [place] office.

## 2017-07-22 ENCOUNTER — Other Ambulatory Visit: Payer: Self-pay | Admitting: Family Medicine

## 2017-07-22 DIAGNOSIS — I10 Essential (primary) hypertension: Secondary | ICD-10-CM

## 2017-08-04 ENCOUNTER — Encounter: Payer: Self-pay | Admitting: Family Medicine

## 2017-09-02 ENCOUNTER — Other Ambulatory Visit: Payer: Self-pay | Admitting: Family Medicine

## 2017-09-06 NOTE — Progress Notes (Signed)
HPI:   Tabitha Adams is a 71 y.o. female, who is here today to follow on some chronic medical problems.  Chronic pain: OA Arthralgias: IP hands bilateral, back, knees,feet. Tramadol 50 mg daily at bedtime, which she has not taken for a month or so because mail order issues. Mobic 15 mg daily prn and Tylenol.  Pain is 5/10 most of the days but when she works back and IP get worse. Back pain is not radiated, L>R.Exacerated by prolonged standing and walking. She denies saddle anesthesia, orine/bowel incontinence.   HTN: She has not started Cozaar 50 mg daily, still taking Benazepril. She is still having dry cough. She did not want to waste medication and just finished last bottle of Benazepril.  Denies headache, visual changes, chest pain, dyspnea, palpitation, claudication, focal weakness, or edema.  Lab Results  Component Value Date   CREATININE 0.80 01/25/2017   BUN 20 01/25/2017   NA 135 01/25/2017   K 4.1 01/25/2017   CL 104 01/25/2017   CO2 24 01/25/2017    Hx of allergies, she is on Zyrtec 10 mg and Singulair.  She takes care of her husband, works part time. She has not had mammogram in 2 years.   Review of Systems  Constitutional: Negative for activity change, appetite change, fatigue, fever and unexpected weight change.  HENT: Negative for mouth sores, nosebleeds and trouble swallowing.   Eyes: Negative for redness and visual disturbance.  Respiratory: Positive for cough. Negative for shortness of breath and wheezing.   Cardiovascular: Negative for chest pain, palpitations and leg swelling.  Gastrointestinal: Negative for abdominal pain, nausea and vomiting.       Negative for changes in bowel habits.  Genitourinary: Negative for decreased urine volume, dysuria and hematuria.  Musculoskeletal: Positive for arthralgias and back pain. Negative for gait problem.  Allergic/Immunologic: Positive for environmental allergies.  Neurological: Negative for  seizures, syncope, weakness and headaches.  Psychiatric/Behavioral: Negative for confusion and sleep disturbance. The patient is not nervous/anxious.       Current Outpatient Prescriptions on File Prior to Visit  Medication Sig Dispense Refill  . b complex vitamins capsule Take 1 capsule by mouth daily.    . cetirizine (ZYRTEC) 10 MG tablet Take 10 mg by mouth at bedtime.    . ferrous sulfate 325 (65 FE) MG tablet Take 325 mg by mouth every Monday, Wednesday, and Friday.    . fluticasone (FLONASE) 50 MCG/ACT nasal spray Place 1 spray into both nostrils 2 (two) times daily as needed for allergies or rhinitis. 16 g 3  . glucosamine-chondroitin 500-400 MG tablet Take 1 tablet by mouth 2 (two) times daily.    Marland Kitchen losartan (COZAAR) 50 MG tablet TAKE 1 TABLET BY MOUTH  DAILY 90 tablet 1  . meclizine (ANTIVERT) 25 MG tablet Take one half to one tablet three times a day for dizziness 30 tablet 0  . meloxicam (MOBIC) 15 MG tablet TAKE 1 TABLET BY MOUTH  DAILY 90 tablet 1  . montelukast (SINGULAIR) 10 MG tablet Take 1 tablet (10 mg total) by mouth at bedtime. 90 tablet 3  . Multiple Vitamin (MULTIVITAMIN WITH MINERALS) TABS tablet Take 1 tablet by mouth daily.    Marland Kitchen omega-3 acid ethyl esters (LOVAZA) 1 g capsule Take 1-2 g by mouth 2 (two) times daily. Pt takes one capsule in the morning and two at night.    Marland Kitchen omeprazole (PRILOSEC) 20 MG capsule Take 1 capsule (20 mg total) by  mouth daily. 90 capsule 3  . pravastatin (PRAVACHOL) 20 MG tablet TAKE 1 TABLET BY MOUTH  DAILY 90 tablet 1  . vitamin C (VITAMIN C) 1000 MG tablet Take 1 tablet (1,000 mg total) by mouth daily. 30 tablet 0   No current facility-administered medications on file prior to visit.      Past Medical History:  Diagnosis Date  . Allergy   . Anemia   . Arthritis   . GERD (gastroesophageal reflux disease)   . Hyperlipidemia    Allergies  Allergen Reactions  . Sulfur Swelling and Other (See Comments)    Reaction:  All over body  swelling   . Penicillins Rash and Other (See Comments)    Has patient had a PCN reaction causing immediate rash, facial/tongue/throat swelling, SOB or lightheadedness with hypotension: No Has patient had a PCN reaction causing severe rash involving mucus membranes or skin necrosis: No Has patient had a PCN reaction that required hospitalization No Has patient had a PCN reaction occurring within the last 10 years: No If all of the above answers are "NO", then may proceed with Cephalosporin use.    Social History   Social History  . Marital status: Married    Spouse name: N/A  . Number of children: N/A  . Years of education: N/A   Social History Main Topics  . Smoking status: Former Research scientist (life sciences)  . Smokeless tobacco: Never Used  . Alcohol use No  . Drug use: No  . Sexual activity: Not Asked   Other Topics Concern  . None   Social History Narrative  . None    Vitals:   09/07/17 1432  BP: 116/70  Pulse: 80  Resp: 12  Temp: 98.3 F (36.8 C)  SpO2: 99%   Body mass index is 28.78 kg/m.   Physical Exam  Nursing note reviewed. Constitutional: She is oriented to person, place, and time. She appears well-developed. No distress.  HENT:  Head: Atraumatic.  Mouth/Throat: Oropharynx is clear and moist and mucous membranes are normal.  Eyes: Pupils are equal, round, and reactive to light. Conjunctivae are normal.  Cardiovascular: Normal rate and regular rhythm.   No murmur heard. Pulses:      Dorsalis pedis pulses are 2+ on the right side, and 2+ on the left side.  Respiratory: Effort normal and breath sounds normal. No respiratory distress.  GI: Soft. She exhibits no mass. There is no hepatomegaly. There is no tenderness.  Musculoskeletal: She exhibits no edema or tenderness.       Thoracic back: She exhibits deformity. She exhibits no tenderness.       Lumbar back: She exhibits spasm (Left paraspinal muscles.). She exhibits no tenderness and no bony tenderness.  +  Scoliosis. Hands:Heberden's node and Bouchard's nodes on some IP joints. No signs of synovitis.   Lymphadenopathy:    She has no cervical adenopathy.  Neurological: She is alert and oriented to person, place, and time. She has normal strength. Gait normal.  Skin: Skin is warm. No rash noted. No erythema.  Psychiatric: She has a normal mood and affect.  Well groomed, good eye contact.     ASSESSMENT AND PLAN:   Ms. Christain was seen today for follow-up.  Diagnoses and all orders for this visit:  Lab Results  Component Value Date   CREATININE 0.77 09/07/2017   BUN 16 09/07/2017   NA 133 (L) 09/07/2017   K 3.8 09/07/2017   CL 97 09/07/2017   CO2 29 09/07/2017  Generalized osteoarthritis of multiple sites  Otherwise stable,  medication is helping with pain. Side effects of Tramadol and NSAID's discussed. Tramadol Rx given today. F/U in 5-6 months.  -     traMADol (ULTRAM) 50 MG tablet; Take 1 tablet (50 mg total) by mouth at bedtime.  Essential hypertension, benign  Adequately controlled.She is going to start Cozaar 50 mg. If cough does not resolve in 3-4 weeks after stopping Benazepril, further work up will be necessary. Monitor BP at home.  DASH-low salt diet recommended. Eye exam recommended annually. F/U in 5-6 months, before if needed.  -     Basic metabolic panel  Chronic pain disorder -     traMADol (ULTRAM) 50 MG tablet; Take 1 tablet (50 mg total) by mouth at bedtime.  Chronic low back pain without sciatica, unspecified back pain laterality  Stable. Stretching exercises, Tai Chi and/or yoga may help. No changes in current management. Instructed about warning signs. F/U in 5-6 months.  Need for influenza vaccination -     Flu vaccine HIGH DOSE PF  Breast cancer screening -     MM SCREENING BREAST TOMO BILATERAL; Future  Healthcare maintenance  She is reporting colonoscopy done in 11/2015 and 5 years f/u was recommended. Mammogram ordered  today.     -Ms. Simmie Garin Pulliam was advised to return sooner than planned today if new concerns arise.       [name]. [name], MD  [facility]. [place] office.

## 2017-09-07 ENCOUNTER — Ambulatory Visit: Payer: Medicare Other | Admitting: Family Medicine

## 2017-09-07 ENCOUNTER — Ambulatory Visit (INDEPENDENT_AMBULATORY_CARE_PROVIDER_SITE_OTHER): Payer: Medicare Other | Admitting: Family Medicine

## 2017-09-07 ENCOUNTER — Encounter: Payer: Self-pay | Admitting: Family Medicine

## 2017-09-07 VITALS — BP 116/70 | HR 80 | Temp 98.3°F | Resp 12 | Ht 59.0 in | Wt 142.5 lb

## 2017-09-07 DIAGNOSIS — M159 Polyosteoarthritis, unspecified: Secondary | ICD-10-CM

## 2017-09-07 DIAGNOSIS — G8929 Other chronic pain: Secondary | ICD-10-CM

## 2017-09-07 DIAGNOSIS — Z23 Encounter for immunization: Secondary | ICD-10-CM | POA: Diagnosis not present

## 2017-09-07 DIAGNOSIS — M545 Low back pain, unspecified: Secondary | ICD-10-CM

## 2017-09-07 DIAGNOSIS — Z1239 Encounter for other screening for malignant neoplasm of breast: Secondary | ICD-10-CM

## 2017-09-07 DIAGNOSIS — G894 Chronic pain syndrome: Secondary | ICD-10-CM | POA: Diagnosis not present

## 2017-09-07 DIAGNOSIS — Z0000 Encounter for general adult medical examination without abnormal findings: Secondary | ICD-10-CM

## 2017-09-07 DIAGNOSIS — I10 Essential (primary) hypertension: Secondary | ICD-10-CM | POA: Diagnosis not present

## 2017-09-07 DIAGNOSIS — Z1231 Encounter for screening mammogram for malignant neoplasm of breast: Secondary | ICD-10-CM | POA: Diagnosis not present

## 2017-09-07 LAB — BASIC METABOLIC PANEL
BUN: 16 mg/dL (ref 6–23)
CHLORIDE: 97 meq/L (ref 96–112)
CO2: 29 meq/L (ref 19–32)
Calcium: 9.6 mg/dL (ref 8.4–10.5)
Creatinine, Ser: 0.77 mg/dL (ref 0.40–1.20)
GFR: 78.42 mL/min (ref >60.00)
GLUCOSE: 86 mg/dL (ref 70–99)
POTASSIUM: 3.8 meq/L (ref 3.5–5.1)
SODIUM: 133 meq/L — AB (ref 135–145)

## 2017-09-07 MED ORDER — TRAMADOL HCL 50 MG PO TABS: 50.0000 mg | ORAL_TABLET | Freq: Every day | ORAL | 2 refills | Status: DC

## 2017-09-07 NOTE — Patient Instructions (Addendum)
A few things to remember from today's visit:   Generalized osteoarthritis of multiple sites - Plan: traMADol (ULTRAM) 50 MG tablet  Essential hypertension, benign  Chronic pain disorder - Plan: traMADol (ULTRAM) 50 MG tablet  Need for influenza vaccination - Plan: Flu vaccine HIGH DOSE PF   Please be sure medication list is accurate. If a new problem present, please set up appointment sooner than planned today.

## 2017-09-09 ENCOUNTER — Encounter: Payer: Self-pay | Admitting: Family Medicine

## 2017-09-09 ENCOUNTER — Other Ambulatory Visit: Payer: Self-pay | Admitting: Family Medicine

## 2017-09-09 DIAGNOSIS — Z1231 Encounter for screening mammogram for malignant neoplasm of breast: Secondary | ICD-10-CM

## 2017-09-27 ENCOUNTER — Ambulatory Visit (INDEPENDENT_AMBULATORY_CARE_PROVIDER_SITE_OTHER): Payer: Medicare Other | Admitting: Family Medicine

## 2017-09-27 ENCOUNTER — Encounter: Payer: Self-pay | Admitting: *Deleted

## 2017-09-27 ENCOUNTER — Encounter: Payer: Self-pay | Admitting: Family Medicine

## 2017-09-27 VITALS — BP 120/70 | HR 91 | Temp 97.9°F | Resp 12 | Ht 59.0 in | Wt 144.5 lb

## 2017-09-27 DIAGNOSIS — R112 Nausea with vomiting, unspecified: Secondary | ICD-10-CM | POA: Diagnosis not present

## 2017-09-27 DIAGNOSIS — H8113 Benign paroxysmal vertigo, bilateral: Secondary | ICD-10-CM

## 2017-09-27 MED ORDER — MECLIZINE HCL 25 MG PO TABS: ORAL_TABLET | ORAL | 0 refills | Status: DC

## 2017-09-27 MED ORDER — ONDANSETRON HCL 4 MG PO TABS: 4.0000 mg | ORAL_TABLET | Freq: Three times a day (TID) | ORAL | 0 refills | Status: AC | PRN

## 2017-09-27 NOTE — Progress Notes (Signed)
ACUTE VISIT HPI:  Chief Complaint  Patient presents with  . Dizziness    [name] is a 71 y.o. female, who is here today complaining of 10 days GOTO review of systems of intermittent episodes of dizziness.  Spinning sensation: Yes. Overall problem is improving but today it was aggravated at the dentist office after lying down on chair and when getting up.  When the dizziness started she had 3 episodes of vomiting. She is still nauseated, aggravated by dizziness. She denies associated abdominal pain, changes in bowel habits, or urinary symptoms.  Prior episodes: Yes, it has been intermittently for years.  Her last exacerbation was about a year ago. Exacerbated by head movement, getting up, and turning in bed. Alleviated by being still for a few seconds.  Negative for associated headache, visual changes, chest pain,dyspnea, palpitation, or syncope. No hearing loss, tinnitus,recent URI or travel.  Has not try OTC medication.  Review of Systems  Constitutional: Positive for activity change and fatigue. Negative for appetite change and fever.  HENT: Negative for congestion, ear discharge, ear pain, facial swelling, hearing loss, mouth sores, nosebleeds, rhinorrhea, sore throat, trouble swallowing and voice change.   Eyes: Negative for photophobia, pain and visual disturbance.  Respiratory: Negative for shortness of breath and wheezing.   Cardiovascular: Negative for chest pain, palpitations and leg swelling.  Gastrointestinal: Positive for nausea. Negative for abdominal pain.       No changes in bowel habits.  Endocrine: Negative for polydipsia, polyphagia and polyuria.  Genitourinary: Negative for decreased urine volume, dysuria and hematuria.  Musculoskeletal: Negative for gait problem and myalgias.  Skin: Negative for color change and rash.  Allergic/Immunologic: Positive for environmental allergies.  Neurological: Positive for dizziness and tremors (No more  than usual.). Negative for syncope, speech difficulty, weakness, numbness and headaches.  Hematological: Negative for adenopathy. Does not bruise/bleed easily.  Psychiatric/Behavioral: Negative for confusion and sleep disturbance. The patient is nervous/anxious.       Current Outpatient Medications on File Prior to Visit  Medication Sig Dispense Refill  . b complex vitamins capsule Take 1 capsule by mouth daily.    . cetirizine (ZYRTEC) 10 MG tablet Take 10 mg by mouth at bedtime.    . ferrous sulfate 325 (65 FE) MG tablet Take 325 mg by mouth every Monday, Wednesday, and Friday.    . fluticasone (FLONASE) 50 MCG/ACT nasal spray Place 1 spray into both nostrils 2 (two) times daily as needed for allergies or rhinitis. 16 g 3  . glucosamine-chondroitin 500-400 MG tablet Take 1 tablet by mouth 2 (two) times daily.    Marland Kitchen losartan (COZAAR) 50 MG tablet TAKE 1 TABLET BY MOUTH  DAILY 90 tablet 1  . meloxicam (MOBIC) 15 MG tablet TAKE 1 TABLET BY MOUTH  DAILY 90 tablet 1  . montelukast (SINGULAIR) 10 MG tablet Take 1 tablet (10 mg total) by mouth at bedtime. 90 tablet 3  . Multiple Vitamin (MULTIVITAMIN WITH MINERALS) TABS tablet Take 1 tablet by mouth daily.    Marland Kitchen omega-3 acid ethyl esters (LOVAZA) 1 g capsule Take 1-2 g by mouth 2 (two) times daily. Pt takes one capsule in the morning and two at night.    Marland Kitchen omeprazole (PRILOSEC) 20 MG capsule Take 1 capsule (20 mg total) by mouth daily. 90 capsule 3  . pravastatin (PRAVACHOL) 20 MG tablet TAKE 1 TABLET BY MOUTH  DAILY 90 tablet 1  . traMADol (ULTRAM) 50 MG tablet Take 1 tablet (50  mg total) by mouth at bedtime. 30 tablet 2  . vitamin C (VITAMIN C) 1000 MG tablet Take 1 tablet (1,000 mg total) by mouth daily. 30 tablet 0   No current facility-administered medications on file prior to visit.      Past Medical History:  Diagnosis Date  . Allergy   . Anemia   . Arthritis   . GERD (gastroesophageal reflux disease)   . Hyperlipidemia     Drug  Allergies:  Allergies  Allergen Reactions  . Sulfur Swelling and Other (See Comments)    Reaction:  All over body swelling   . Penicillins Rash and Other (See Comments)    Has patient had a PCN reaction causing immediate rash, facial/tongue/throat swelling, SOB or lightheadedness with hypotension: No Has patient had a PCN reaction causing severe rash involving mucus membranes or skin necrosis: No Has patient had a PCN reaction that required hospitalization No Has patient had a PCN reaction occurring within the last 10 years: No If all of the above answers are "NO", then may proceed with Cephalosporin use.     Social History   Socioeconomic History  . Marital status: Married    Spouse name: None  . Number of children: None  . Years of education: None  . Highest education level: None  Social Needs  . Financial resource strain: None  . Food insecurity - worry: None  . Food insecurity - inability: None  . Transportation needs - medical: None  . Transportation needs - non-medical: None  Occupational History  . None  Tobacco Use  . Smoking status: Former Research scientist (life sciences)  . Smokeless tobacco: Never Used  Substance and Sexual Activity  . Alcohol use: No  . Drug use: No  . Sexual activity: None  Other Topics Concern  . None  Social History Narrative  . None    Vitals:   09/27/17 1147  BP: 120/70  Pulse: 91  Resp: 12  Temp: 97.9 F (36.6 C)  SpO2: 97%   Body mass index is 29.19 kg/m.   Physical Exam  Nursing note and vitals reviewed. Constitutional: She is oriented to person, place, and time. She appears well-developed. She does not appear ill. No distress.  HENT:  Head: Normocephalic and atraumatic.  Right Ear: Hearing, tympanic membrane, external ear and ear canal normal.  Left Ear: Hearing, tympanic membrane, external ear and ear canal normal.  Mouth/Throat: Oropharynx is clear and moist and mucous membranes are normal.  Apley maneuver positive. Nystagmus present.    Eyes: Conjunctivae are normal. Pupils are equal, round, and reactive to light.  Neck: No JVD present. Carotid bruit is not present.  Cardiovascular: Normal rate and regular rhythm.  No murmur heard. Pulses:      Dorsalis pedis pulses are 2+ on the right side, and 2+ on the left side.  Respiratory: Effort normal and breath sounds normal. No respiratory distress.  GI: Soft. She exhibits no mass. There is no hepatomegaly. There is no tenderness.  Musculoskeletal: She exhibits no edema or tenderness.  Lymphadenopathy:    She has no cervical adenopathy.  Neurological: She is alert and oriented to person, place, and time. She has normal strength. No cranial nerve deficit or sensory deficit. Coordination normal.  Reflex Scores:      Patellar reflexes are 2+ on the right side and 2+ on the left side. Pronator drift negative. Mildly unstable gait when she first gets up. Not assistance needed.  Skin: Skin is warm. No rash noted. No cyanosis  or erythema.  Psychiatric: She has a normal mood and affect. Cognition and memory are normal.  Well groomed, good eye contact.     ASSESSMENT AND PLAN:  [name] was seen today for dizziness.  Diagnoses and all orders for this visit:  Nausea and vomiting in adult  Symptomatic treatment with Zofran 3 times per day as needed recommended. Adequate hydration. Instructed about warning signs.  -     ondansetron (ZOFRAN) 4 MG tablet; Take 1 tablet (4 mg total) every 8 (eight) hours as needed for up to 5 days by mouth for nausea or vomiting.  Benign paroxysmal positional vertigo due to bilateral vestibular disorder  We discussed other possible etiologies of dizziness, Hx and examination today suggest benign vertigo. I do not think further work-up is necessary at this time but needs to be consider if worsening or persient symptoms for more that 2 weeks; in this case ENT will be considered. Explained that problem can be recurrent. Fall  prevention. Vestibular exercises recommended, handout with Semont maneuvers given. Meclizine 25 mg tid prn, some side effects discussed. Instructed about warning signs. F/U as needed.  -     meclizine (ANTIVERT) 25 MG tablet; Take one half to one tablet three times a day for dizziness       -[name] was advised to return or notify a doctor immediately if symptoms worsen or persist or new concerns arise.     [name]. [name], MD  [facility]. [place] office.

## 2017-09-27 NOTE — Patient Instructions (Signed)
A few things to remember from today's visit:   Benign paroxysmal positional vertigo due to bilateral vestibular disorder - Plan: meclizine (ANTIVERT) 25 MG tablet   Dizziness is a perception of movement, it is sometimes difficult to describe and can be  caused by different problems, most benign but others can be life threaten.  Vertigo is the most common cause of dizziness, usually related with inner ear and can be associated with nausea, vomiting, and unbalance sensation. It can be complicated by falls due to lose of balance; so fall precautions are very important.  Most of the time dizziness is benign, usually intermittent, last a few seconds at the time and aggravated by certain positions. It usually resolves in a few weeks without residual effect but it could be recurrent.  Sometimes blood work is ordered to evaluate for other possible causes.  Dizziness can also be caused by certain medications, dehydration, migraines, and strokes.  Medication prescribed for vertigo, Meclizine, causes drowsiness/sleepiness, so frequently I recommended taking it at bedtime. I also recommend what we called vestibular exercise, sometimes can be done at home (Modified Semont maneuvers) other times I refer patients to vestibular rehabilitation.   Seek immediate medical attention if: New severe headache, dobble vision, fever (100 F or more), associated numbness/tingling, focal weakness, persistent vomiting, not able to walk, or sudden worsening symptoms. Please be sure medication list is accurate. If a new problem present, please set up appointment sooner than planned today.      \

## 2017-10-05 ENCOUNTER — Ambulatory Visit
Admission: RE | Admit: 2017-10-05 | Discharge: 2017-10-05 | Disposition: A | Discharge status: Home / Self Care | Payer: Medicare Other | Source: Ambulatory Visit | Attending: Family Medicine | Admitting: Family Medicine

## 2017-10-05 DIAGNOSIS — Z1231 Encounter for screening mammogram for malignant neoplasm of breast: Secondary | ICD-10-CM

## 2017-10-14 ENCOUNTER — Other Ambulatory Visit: Payer: Self-pay | Admitting: Family Medicine

## 2017-10-14 ENCOUNTER — Telehealth: Payer: Self-pay | Admitting: Family Medicine

## 2017-10-14 DIAGNOSIS — R42 Dizziness and giddiness: Secondary | ICD-10-CM

## 2017-10-14 NOTE — Telephone Encounter (Signed)
Referral to ENT placed.  Betty Jordan, MD  

## 2017-10-14 NOTE — Telephone Encounter (Signed)
Copied from [place] [id number]. Topic: Referral - Request >> Oct 14, 2017  1:53 PM Lennox Solders wrote: Reason for CRM: pt would like a referral to see ENT for vertigo. Pt has medicare

## 2017-11-11 ENCOUNTER — Encounter: Payer: Self-pay | Admitting: Internal Medicine

## 2017-11-11 ENCOUNTER — Ambulatory Visit (INDEPENDENT_AMBULATORY_CARE_PROVIDER_SITE_OTHER): Payer: Medicare Other | Admitting: Internal Medicine

## 2017-11-11 VITALS — BP 130/72 | HR 87 | Temp 98.4°F | Resp 16 | Wt 142.0 lb

## 2017-11-11 DIAGNOSIS — J0100 Acute maxillary sinusitis, unspecified: Secondary | ICD-10-CM | POA: Diagnosis not present

## 2017-11-11 MED ORDER — HYDROCODONE-HOMATROPINE 5-1.5 MG/5ML PO SYRP: 5.0000 mL | ORAL_SOLUTION | Freq: Three times a day (TID) | ORAL | 0 refills | Status: DC | PRN

## 2017-11-11 MED ORDER — CEPHALEXIN 500 MG PO CAPS
500.0000 mg | ORAL_CAPSULE | Freq: Three times a day (TID) | ORAL | 0 refills | Status: DC
Start: 2017-11-11 — End: 2017-11-23

## 2017-11-11 MED ORDER — METHYLPREDNISOLONE 4 MG PO TBPK: ORAL_TABLET | ORAL | 0 refills | Status: DC

## 2017-11-11 NOTE — Patient Instructions (Signed)
Take the cough syrup at night.  You can take it during the day if you are able to sleep.  Take the steroid as prescribed.  Use the antibiotic only if needed if your symptoms do not improve over the next few days.     Sinusitis, Adult Sinusitis is soreness and inflammation of your sinuses. Sinuses are hollow spaces in the bones around your face. Your sinuses are located:  Around your eyes.  In the middle of your forehead.  Behind your nose.  In your cheekbones.  Your sinuses and nasal passages are lined with a stringy fluid (mucus). Mucus normally drains out of your sinuses. When your nasal tissues become inflamed or swollen, the mucus can become trapped or blocked so air cannot flow through your sinuses. This allows bacteria, viruses, and funguses to grow, which leads to infection. Sinusitis can develop quickly and last for 7?10 days (acute) or for more than 12 weeks (chronic). Sinusitis often develops after a cold. What are the causes? This condition is caused by anything that creates swelling in the sinuses or stops mucus from draining, including:  Allergies.  Asthma.  Bacterial or viral infection.  Abnormally shaped bones between the nasal passages.  Nasal growths that contain mucus (nasal polyps).  Narrow sinus openings.  Pollutants, such as chemicals or irritants in the air.  A foreign object stuck in the nose.  A fungal infection. This is rare.  What increases the risk? The following factors may make you more likely to develop this condition:  Having allergies or asthma.  Having had a recent cold or respiratory tract infection.  Having structural deformities or blockages in your nose or sinuses.  Having a weak immune system.  Doing a lot of swimming or diving.  Overusing nasal sprays.  Smoking.  What are the signs or symptoms? The main symptoms of this condition are pain and a feeling of pressure around the affected sinuses. Other symptoms  include:  Upper toothache.  Earache.  Headache.  Bad breath.  Decreased sense of smell and taste.  A cough that may get worse at night.  Fatigue.  Fever.  Thick drainage from your nose. The drainage is often green and it may contain pus (purulent).  Stuffy nose or congestion.  Postnasal drip. This is when extra mucus collects in the throat or back of the nose.  Swelling and warmth over the affected sinuses.  Sore throat.  Sensitivity to light.  How is this diagnosed? This condition is diagnosed based on symptoms, a medical history, and a physical exam. To find out if your condition is acute or chronic, your health care provider may:  Look in your nose for signs of nasal polyps.  Tap over the affected sinus to check for signs of infection.  View the inside of your sinuses using an imaging device that has a light attached (endoscope).  If your health care provider suspects that you have chronic sinusitis, you may also:  Be tested for allergies.  Have a sample of mucus taken from your nose (nasal culture) and checked for bacteria.  Have a mucus sample examined to see if your sinusitis is related to an allergy.  If your sinusitis does not respond to treatment and it lasts longer than 8 weeks, you may have an MRI or CT scan to check your sinuses. These scans also help to determine how severe your infection is. In rare cases, a bone biopsy may be done to rule out more serious types of fungal  sinus disease. How is this treated? Treatment for sinusitis depends on the cause and whether your condition is chronic or acute. If a virus is causing your sinusitis, your symptoms will go away on their own within 10 days. You may be given medicines to relieve your symptoms, including:  Topical nasal decongestants. They shrink swollen nasal passages and let mucus drain from your sinuses.  Antihistamines. These drugs block inflammation that is triggered by allergies. This can help  to ease swelling in your nose and sinuses.  Topical nasal corticosteroids. These are nasal sprays that ease inflammation and swelling in your nose and sinuses.  Nasal saline washes. These rinses can help to get rid of thick mucus in your nose.  If your condition is caused by bacteria, you will be given an antibiotic medicine. If your condition is caused by a fungus, you will be given an antifungal medicine. Surgery may be needed to correct underlying conditions, such as narrow nasal passages. Surgery may also be needed to remove polyps. Follow these instructions at home: Medicines  Take, use, or apply over-the-counter and prescription medicines only as told by your health care provider. These may include nasal sprays.  If you were prescribed an antibiotic medicine, take it as told by your health care provider. Do not stop taking the antibiotic even if you start to feel better. Hydrate and Humidify  Drink enough water to keep your urine clear or pale yellow. Staying hydrated will help to thin your mucus.  Use a cool mist humidifier to keep the humidity level in your home above 50%.  Inhale steam for 10-15 minutes, 3-4 times a day or as told by your health care provider. You can do this in the bathroom while a hot shower is running.  Limit your exposure to cool or dry air. Rest  Rest as much as possible.  Sleep with your head raised (elevated).  Make sure to get enough sleep each night. General instructions  Apply a warm, moist washcloth to your face 3-4 times a day or as told by your health care provider. This will help with discomfort.  Wash your hands often with soap and water to reduce your exposure to viruses and other germs. If soap and water are not available, use hand sanitizer.  Do not smoke. Avoid being around people who are smoking (secondhand smoke).  Keep all follow-up visits as told by your health care provider. This is important. Contact a health care provider  if:  You have a fever.  Your symptoms get worse.  Your symptoms do not improve within 10 days. Get help right away if:  You have a severe headache.  You have persistent vomiting.  You have pain or swelling around your face or eyes.  You have vision problems.  You develop confusion.  Your neck is stiff.  You have trouble breathing. This information is not intended to replace advice given to you by your health care provider. Make sure you discuss any questions you have with your health care provider. Document Released: 11/01/2005 Document Revised: 06/27/2016 Document Reviewed: 08/27/2015 Elsevier Interactive Patient Education  [employer].

## 2017-11-11 NOTE — Progress Notes (Signed)
Subjective:    Patient ID: Tabitha Adams, female    DOB: Aug 19, 1946, 71 y.o.   MRN: [id number]  HPI She is here for an acute visit for cold symptoms.  Her symptoms started 2 days ago.   She is experiencing nasal congestion, headaches, postnasal drip, runny nose, sore throat, cough that is sometimes productive and body aches.  She is not experience any fevers, chills, ear pain, sinus pain, shortness of breath, wheezing, lightheadedness or GI symptoms.  Her husband died recently and she was in the hospital a lot, but denies other sick contacts.  She has taken Emergen C and tylenol.   Medications and allergies reviewed with patient and updated if appropriate.  Patient Active Problem List   Diagnosis Date Noted  . Benign paroxysmal positional vertigo due to bilateral vestibular disorder 09/27/2017  . Barton's fracture of right radius 01/25/2017  . Essential hypertension, benign 10/04/2016  . Generalized osteoarthritis of multiple sites 08/31/2016  . Chronic back pain 07/13/2016  . Hyperlipidemia 07/13/2016  . Allergic rhinitis 07/13/2016  . GERD (gastroesophageal reflux disease) 07/13/2016  . Chronic pain disorder 07/13/2016    Current Outpatient Medications on File Prior to Visit  Medication Sig Dispense Refill  . b complex vitamins capsule Take 1 capsule by mouth daily.    . cetirizine (ZYRTEC) 10 MG tablet Take 10 mg by mouth at bedtime.    . ferrous sulfate 325 (65 FE) MG tablet Take 325 mg by mouth every Monday, Wednesday, and Friday.    . fluticasone (FLONASE) 50 MCG/ACT nasal spray Place 1 spray into both nostrils 2 (two) times daily as needed for allergies or rhinitis. 16 g 3  . glucosamine-chondroitin 500-400 MG tablet Take 1 tablet by mouth 2 (two) times daily.    Marland Kitchen losartan (COZAAR) 50 MG tablet TAKE 1 TABLET BY MOUTH  DAILY 90 tablet 1  . meclizine (ANTIVERT) 25 MG tablet Take one half to one tablet three times a day for dizziness 30 tablet 0  . meloxicam  (MOBIC) 15 MG tablet TAKE 1 TABLET BY MOUTH  DAILY 90 tablet 1  . montelukast (SINGULAIR) 10 MG tablet Take 1 tablet (10 mg total) by mouth at bedtime. 90 tablet 3  . Multiple Vitamin (MULTIVITAMIN WITH MINERALS) TABS tablet Take 1 tablet by mouth daily.    Marland Kitchen omega-3 acid ethyl esters (LOVAZA) 1 g capsule Take 1-2 g by mouth 2 (two) times daily. Pt takes one capsule in the morning and two at night.    Marland Kitchen omeprazole (PRILOSEC) 20 MG capsule Take 1 capsule (20 mg total) by mouth daily. 90 capsule 3  . pravastatin (PRAVACHOL) 20 MG tablet TAKE 1 TABLET BY MOUTH  DAILY 90 tablet 1  . traMADol (ULTRAM) 50 MG tablet Take 1 tablet (50 mg total) by mouth at bedtime. 30 tablet 2  . vitamin C (VITAMIN C) 1000 MG tablet Take 1 tablet (1,000 mg total) by mouth daily. 30 tablet 0   No current facility-administered medications on file prior to visit.     Past Medical History:  Diagnosis Date  . Allergy   . Anemia   . Arthritis   . GERD (gastroesophageal reflux disease)   . Hyperlipidemia     Past Surgical History:  Procedure Laterality Date  . BREAST CYST ASPIRATION Left   . BREAST EXCISIONAL BIOPSY Left   . BREAST EXCISIONAL BIOPSY Left   . ORIF WRIST FRACTURE Right 01/25/2017   Procedure: OPEN REDUCTION INTERNAL FIXATION (ORIF) WRIST FRACTURE;  Surgeon: Roseanne Kaufman, MD;  Location: [facility];  Service: Orthopedics;  Laterality: Right;    Social History   Socioeconomic History  . Marital status: Married    Spouse name: None  . Number of children: None  . Years of education: None  . Highest education level: None  Social Needs  . Financial resource strain: None  . Food insecurity - worry: None  . Food insecurity - inability: None  . Transportation needs - medical: None  . Transportation needs - non-medical: None  Occupational History  . None  Tobacco Use  . Smoking status: Former Research scientist (life sciences)  . Smokeless tobacco: Never Used  Substance and Sexual Activity  . Alcohol use: No  . Drug use: No    . Sexual activity: None  Other Topics Concern  . None  Social History Narrative  . None    Family History  Problem Relation Age of Onset  . Stroke Father   . Hypertension Father   . Alcohol abuse Father     Review of Systems  Constitutional: Negative for chills and fever.  HENT: Positive for congestion, postnasal drip, rhinorrhea and sore throat. Negative for ear pain (ears itching), sinus pressure and sinus pain.   Respiratory: Positive for cough (sometimes productive). Negative for shortness of breath and wheezing.   Gastrointestinal: Negative for abdominal pain, diarrhea and nausea.  Musculoskeletal: Positive for myalgias.  Neurological: Positive for headaches. Negative for dizziness and light-headedness.       Objective:   Vitals:   11/11/17 1005  BP: 130/72  Pulse: 87  Resp: 16  Temp: 98.4 F (36.9 C)  SpO2: 98%   Filed Weights   11/11/17 1005  Weight: 142 lb (64.4 kg)   Body mass index is 28.68 kg/m.  Wt Readings from Last 3 Encounters:  11/11/17 142 lb (64.4 kg)  09/27/17 144 lb 8 oz (65.5 kg)  09/07/17 142 lb 8 oz (64.6 kg)     Physical Exam GENERAL APPEARANCE: Appears stated age, well appearing, NAD EYES: conjunctiva clear, no icterus HEENT: bilateral tympanic membranes and ear canals normal, oropharynx with mild erythema, no thyromegaly, trachea midline, no cervical or supraclavicular lymphadenopathy LUNGS: Clear to auscultation without wheeze or crackles, unlabored breathing, good air entry bilaterally CARDIOVASCULAR: Normal S1,S2 without murmurs, no edema SKIN: warm, dry        Assessment & Plan:   See Problem List for Assessment and Plan of chronic medical problems.

## 2017-11-11 NOTE — Assessment & Plan Note (Signed)
Likely viral in nature, which I discussed with her Continue over-the-counter cold medications Hycodan cough syrup every 8 hours as needed-discussed that this will make her drowsy Medrol Dosepak I have prescribed her an antibiotic, but advised that she should not need this.  Her illness is very early in the course and most likely she has a viral infection.  She agrees she wants to avoid this if possible Increase rest and fluids Call if no improvement

## 2017-11-23 ENCOUNTER — Encounter: Payer: Self-pay | Admitting: Family Medicine

## 2017-11-23 ENCOUNTER — Ambulatory Visit (INDEPENDENT_AMBULATORY_CARE_PROVIDER_SITE_OTHER): Payer: Medicare Other | Admitting: Family Medicine

## 2017-11-23 VITALS — BP 126/78 | HR 83 | Temp 98.3°F | Resp 12 | Ht 59.0 in | Wt 145.2 lb

## 2017-11-23 DIAGNOSIS — H8110 Benign paroxysmal vertigo, unspecified ear: Secondary | ICD-10-CM

## 2017-11-23 MED ORDER — MECLIZINE HCL 25 MG PO TABS: 25.0000 mg | ORAL_TABLET | Freq: Three times a day (TID) | ORAL | 1 refills | Status: DC | PRN

## 2017-11-23 NOTE — Patient Instructions (Addendum)
  A few things to remember from today's visit:   Benign paroxysmal positional vertigo, unspecified laterality  Benign paroxysmal positional vertigo due to bilateral vestibular disorder   Please be sure medication list is accurate. If a new problem present, please set up appointment sooner than planned today.        Vertigo Vertigo means that you feel like you are moving when you are not. Vertigo can also make you feel like things around you are moving when they are not. This feeling can come and go at any time. Vertigo often goes away on its own. Follow these instructions at home:  Avoid making fast movements.  Avoid driving.  Avoid using heavy machinery.  Avoid doing any task or activity that might cause danger to you or other people if you would have a vertigo attack while you are doing it.  Sit down right away if you feel dizzy or have trouble with your balance.  Take over-the-counter and prescription medicines only as told by your doctor.  Follow instructions from your doctor about which positions or movements you should avoid.  Drink enough fluid to keep your pee (urine) clear or pale yellow.  Keep all follow-up visits as told by your doctor. This is important. Contact a doctor if:  Medicine does not help your vertigo.  You have a fever.  Your problems get worse or you have new symptoms.  Your family or friends see changes in your behavior.  You feel sick to your stomach (nauseous) or you throw up (vomit).  You have a "pins and needles" feeling or you are numb in part of your body. Get help right away if:  You have trouble moving or talking.  You are always dizzy.  You pass out (faint).  You get very bad headaches.  You feel weak or have trouble using your hands, arms, or legs.  You have changes in your hearing.  You have changes in your seeing (vision).  You get a stiff neck.  Bright light starts to bother you. This information is not  intended to replace advice given to you by your health care provider. Make sure you discuss any questions you have with your health care provider. Document Released: 08/10/2008 Document Revised: 04/08/2016 Document Reviewed: 02/24/2015 Elsevier Interactive Patient Education  2018 [employer].  Dr. Leonides Sake. Lucia Gaskins, MD Address: [address] [place], [place] [address], [place], [place] [address] Phone: [phone]

## 2017-11-23 NOTE — Progress Notes (Signed)
ACUTE VISIT   HPI:  Chief Complaint  Patient presents with  . Dizziness    [name] is a 72 y.o. female, who is here today complaining of persistent dizziness, spinning sensation. She has had similar episodes for a while but seem to be worse for the past few weeks. She denies associated headache, visual changes, hearing loss, tinnitus, chest pain, palpitations, dyspnea, abdominal pain, nausea, vomiting, or focal deficit. [place] a couple of nights ago,she got up fast to get help her dog get into bed with her.She was able to get up, unharmed. Denies head trauma.  She was recently treated for a acute sinusitis.  She has taken Meclizine 25 mg as needed, which help.  She would like an appointment with ENT, referral was placed in November 2018.  According to patient, she has not received information about this appointment.  Dizziness is exacerbated by head movements on by turning in bed. Alleviated by being still.  She has been under a lot of stress. Recently her husband had complications after colostomy reversal surgery and died a few days ago. She lives alone but her children live close by and call her daily. She works part-time, next work day is 11/27/17.   Review of Systems  Constitutional: Positive for fatigue. Negative for chills, diaphoresis and fever.  HENT: Negative for congestion, ear discharge, ear pain, facial swelling, hearing loss, mouth sores, sore throat, trouble swallowing and voice change.   Eyes: Negative for photophobia and visual disturbance.  Respiratory: Negative for cough, shortness of breath and wheezing.   Cardiovascular: Negative for chest pain, palpitations and leg swelling.  Gastrointestinal: Positive for nausea. Negative for abdominal pain and vomiting.       No changes in bowel habits.  Endocrine: Negative for cold intolerance and heat intolerance.  Genitourinary: Negative for decreased urine volume, dysuria and hematuria.    Musculoskeletal: Negative for gait problem and myalgias.  Allergic/Immunologic: Positive for environmental allergies.  Neurological: Positive for dizziness. Negative for syncope, speech difficulty, weakness, numbness and headaches.  Hematological: Negative for adenopathy. Does not bruise/bleed easily.  Psychiatric/Behavioral: Negative for confusion and sleep disturbance. The patient is nervous/anxious.       Current Outpatient Medications on File Prior to Visit  Medication Sig Dispense Refill  . b complex vitamins capsule Take 1 capsule by mouth daily.    . cetirizine (ZYRTEC) 10 MG tablet Take 10 mg by mouth at bedtime.    . ferrous sulfate 325 (65 FE) MG tablet Take 325 mg by mouth every Monday, Wednesday, and Friday.    . fluticasone (FLONASE) 50 MCG/ACT nasal spray Place 1 spray into both nostrils 2 (two) times daily as needed for allergies or rhinitis. 16 g 3  . glucosamine-chondroitin 500-400 MG tablet Take 1 tablet by mouth 2 (two) times daily.    Marland Kitchen losartan (COZAAR) 50 MG tablet TAKE 1 TABLET BY MOUTH  DAILY 90 tablet 1  . meloxicam (MOBIC) 15 MG tablet TAKE 1 TABLET BY MOUTH  DAILY 90 tablet 1  . methylPREDNISolone (MEDROL DOSEPAK) 4 MG TBPK tablet 24 mg PO on day 1, then decr. by 4 mg/day x5 days 21 tablet 0  . montelukast (SINGULAIR) 10 MG tablet Take 1 tablet (10 mg total) by mouth at bedtime. 90 tablet 3  . Multiple Vitamin (MULTIVITAMIN WITH MINERALS) TABS tablet Take 1 tablet by mouth daily.    Marland Kitchen omega-3 acid ethyl esters (LOVAZA) 1 g capsule Take 1-2 g by mouth 2 (two)  times daily. Pt takes one capsule in the morning and two at night.    Marland Kitchen omeprazole (PRILOSEC) 20 MG capsule Take 1 capsule (20 mg total) by mouth daily. 90 capsule 3  . pravastatin (PRAVACHOL) 20 MG tablet TAKE 1 TABLET BY MOUTH  DAILY 90 tablet 1  . traMADol (ULTRAM) 50 MG tablet Take 1 tablet (50 mg total) by mouth at bedtime. 30 tablet 2  . vitamin C (VITAMIN C) 1000 MG tablet Take 1 tablet (1,000 mg  total) by mouth daily. 30 tablet 0  . HYDROcodone-homatropine (HYCODAN) 5-1.5 MG/5ML syrup Take 5 mLs by mouth every 8 (eight) hours as needed for cough. (Patient not taking: Reported on 11/23/2017) 75 mL 0   No current facility-administered medications on file prior to visit.      Past Medical History:  Diagnosis Date  . Allergy   . Anemia   . Arthritis   . GERD (gastroesophageal reflux disease)   . Hyperlipidemia    Allergies  Allergen Reactions  . Sulfur Swelling and Other (See Comments)    Reaction:  All over body swelling   . Penicillins Rash and Other (See Comments)    Has patient had a PCN reaction causing immediate rash, facial/tongue/throat swelling, SOB or lightheadedness with hypotension: No Has patient had a PCN reaction causing severe rash involving mucus membranes or skin necrosis: No Has patient had a PCN reaction that required hospitalization No Has patient had a PCN reaction occurring within the last 10 years: No If all of the above answers are "NO", then may proceed with Cephalosporin use.    Social History   Socioeconomic History  . Marital status: Married    Spouse name: None  . Number of children: None  . Years of education: None  . Highest education level: None  Social Needs  . Financial resource strain: None  . Food insecurity - worry: None  . Food insecurity - inability: None  . Transportation needs - medical: None  . Transportation needs - non-medical: None  Occupational History  . None  Tobacco Use  . Smoking status: Former Research scientist (life sciences)  . Smokeless tobacco: Never Used  Substance and Sexual Activity  . Alcohol use: No  . Drug use: No  . Sexual activity: None  Other Topics Concern  . None  Social History Narrative  . None    Vitals:   11/23/17 1611  BP: 126/78  Pulse: 83  Resp: 12  Temp: 98.3 F (36.8 C)  SpO2: 100%   Body mass index is 29.34 kg/m.   Physical Exam  Nursing note and vitals reviewed. Constitutional: She is oriented  to person, place, and time. She appears well-developed. No distress.  HENT:  Head: Normocephalic and atraumatic.  Mouth/Throat: Oropharynx is clear and moist and mucous membranes are normal.  Eyes: Conjunctivae are normal. Pupils are equal, round, and reactive to light.  Neck: Carotid bruit is not present.  Cardiovascular: Normal rate and regular rhythm.  No murmur heard. Pulses:      Dorsalis pedis pulses are 2+ on the right side, and 2+ on the left side.  Respiratory: Effort normal and breath sounds normal. No respiratory distress.  Musculoskeletal: She exhibits no edema.  Lymphadenopathy:    She has no cervical adenopathy.  Neurological: She is alert and oriented to person, place, and time. She has normal strength. No cranial nerve deficit. Coordination and gait normal.  Skin: Skin is warm. No erythema.  Psychiatric: Her affect is labile (Husband recently died.).  Well groomed, good eye contact.    ASSESSMENT AND PLAN:     Josiane was seen today for dizziness.  Diagnoses and all orders for this visit:  Benign paroxysmal positional vertigo, unspecified laterality -     meclizine (ANTIVERT) 25 MG tablet; Take 1 tablet (25 mg total) by mouth 3 (three) times daily as needed for dizziness.     Referral to ENT was already placed on 10/14/2017, she was supposed to see Dr. Lucia Gaskins.  Because she was in the hospital with her husband during most part of December/2018, she might have missed phone call.   I gave him ENT office information, so she can call and schedule appointment.  We discussed other possible etiologies of dizziness, Hx suggest benign vertigo.  Explained that problem can be recurrent. Fall prevention.  Meclizine 25 mg tid prn, some side effects discussed. Instructed about warning signs. F/U as needed.     -Tabitha Adams was advised to seek immediate medical attention if sudden worsening symptoms.     Tabitha Guettler G. [name], MD  [facility]. [place] office.

## 2017-12-02 DIAGNOSIS — H8303 Labyrinthitis, bilateral: Secondary | ICD-10-CM | POA: Diagnosis not present

## 2017-12-12 DIAGNOSIS — R42 Dizziness and giddiness: Secondary | ICD-10-CM | POA: Diagnosis not present

## 2017-12-16 ENCOUNTER — Other Ambulatory Visit: Payer: Self-pay | Admitting: Family Medicine

## 2017-12-16 DIAGNOSIS — M159 Polyosteoarthritis, unspecified: Secondary | ICD-10-CM

## 2017-12-16 DIAGNOSIS — G894 Chronic pain syndrome: Secondary | ICD-10-CM

## 2017-12-19 DIAGNOSIS — H8113 Benign paroxysmal vertigo, bilateral: Secondary | ICD-10-CM | POA: Diagnosis not present

## 2017-12-31 ENCOUNTER — Other Ambulatory Visit: Payer: Self-pay | Admitting: Family Medicine

## 2017-12-31 DIAGNOSIS — J309 Allergic rhinitis, unspecified: Secondary | ICD-10-CM

## 2017-12-31 DIAGNOSIS — K219 Gastro-esophageal reflux disease without esophagitis: Secondary | ICD-10-CM

## 2017-12-31 DIAGNOSIS — E785 Hyperlipidemia, unspecified: Secondary | ICD-10-CM

## 2018-01-31 NOTE — Progress Notes (Signed)
HPI:   [name] is a 72 y.o. female, who is here today for 5 months follow up.   She was last seen on 11/23/2017 due to vertigo. , she was evaluated by ENT , she completed vestibular treatment. Vertigo has improved.   Her husband recently died a few months ago. She is living alone, her children check on her daily. She is requesting medication to help with depressed mood. She denies suicidal thoughts.  No prior Hx of depression or anxiety.   Hypertension:    Currently on Losartan 50 mg daily. Cough resolved after discontinuing Benazepril. Last eye exam: Over a year ago. Home BP readings: Not checking.  She is taking medications as instructed, no side effects reported.  She has not noted unusual headache, visual changes, exertional chest pain, dyspnea,  focal weakness, or edema.   Lab Results  Component Value Date   CREATININE 0.77 09/07/2017   BUN 16 09/07/2017   NA 133 (L) 09/07/2017   K 3.8 09/07/2017   CL 97 09/07/2017   CO2 29 09/07/2017      Hyperlipidemia:  Currently on Pravastatin 20 mg daily. Following a low fat diet: Not consistently.  She has not noted side effects with medication.  Lab Results  Component Value Date   CHOL 207 (H) 03/09/2017   HDL 50.00 03/09/2017   LDLCALC 132 (H) 03/09/2017   TRIG 125.0 03/09/2017   CHOLHDL 4 03/09/2017      Chronic pain, generalized OA: IP,back,knees,and feet. No edema or erythema. No limitation of ROM.  She is on Tramadol 50 mg at bedtime and Mobic 15 mg daily. Pain 2/10 while she is on medications.  She denies side effects from medications.  Anemia:  Last colonoscopy 2017 in [place]. She has not noted blood in stool,melena,nose/gum bleeding,or gross hematuria.  She is on iron supplementation.    Lab Results  Component Value Date   WBC 11.8 (H) 01/25/2017   HGB 11.1 (L) 01/25/2017   HCT 31.9 (L) 01/25/2017   MCV 82.6 01/25/2017   PLT 244 01/25/2017    Allergic  rhinitis: She is on Singulair 10 mg daily,Flonase nasal spray, and Zyrtec 10 mg daily. Mild rhinorrhea and nasal congestion, overall symptoms are well controlled.  Medications help.   Review of Systems  Constitutional: Positive for fatigue. Negative for activity change, appetite change, fever and unexpected weight change.  HENT: Positive for congestion. Negative for mouth sores, nosebleeds and trouble swallowing.   Eyes: Positive for itching. Negative for redness and visual disturbance.  Respiratory: Negative for cough, shortness of breath and wheezing.   Cardiovascular: Negative for chest pain, palpitations and leg swelling.  Gastrointestinal: Negative for abdominal pain, nausea and vomiting.       Negative for changes in bowel habits.  Endocrine: Negative for cold intolerance and heat intolerance.  Genitourinary: Negative for decreased urine volume, dysuria and hematuria.  Musculoskeletal: Positive for arthralgias and back pain. Negative for gait problem.  Skin: Negative for rash.  Allergic/Immunologic: Positive for environmental allergies.  Neurological: Negative for syncope, weakness and headaches.  Psychiatric/Behavioral: Negative for confusion. The patient is nervous/anxious.       Current Outpatient Medications on File Prior to Visit  Medication Sig Dispense Refill  . b complex vitamins capsule Take 1 capsule by mouth daily.    . cetirizine (ZYRTEC) 10 MG tablet Take 10 mg by mouth at bedtime.    . ferrous sulfate 325 (65 FE) MG tablet Take 325 mg by  mouth every Monday, Wednesday, and Friday.    Marland Kitchen glucosamine-chondroitin 500-400 MG tablet Take 1 tablet by mouth 2 (two) times daily.    . meclizine (ANTIVERT) 25 MG tablet Take 1 tablet (25 mg total) by mouth 3 (three) times daily as needed for dizziness. 45 tablet 1  . meloxicam (MOBIC) 15 MG tablet TAKE 1 TABLET BY MOUTH  DAILY 90 tablet 1  . montelukast (SINGULAIR) 10 MG tablet TAKE 1 TABLET BY MOUTH AT  BEDTIME 90 tablet 3    . Multiple Vitamin (MULTIVITAMIN WITH MINERALS) TABS tablet Take 1 tablet by mouth daily.    Marland Kitchen omega-3 acid ethyl esters (LOVAZA) 1 g capsule Take 1-2 g by mouth 2 (two) times daily. Pt takes one capsule in the morning and two at night.    Marland Kitchen omeprazole (PRILOSEC) 20 MG capsule TAKE 1 CAPSULE BY MOUTH  DAILY 90 capsule 3  . pravastatin (PRAVACHOL) 20 MG tablet TAKE 1 TABLET BY MOUTH  DAILY 90 tablet 1  . traMADol (ULTRAM) 50 MG tablet TAKE 1 TABLET BY MOUTH AT BEDTIME 30 tablet 2  . vitamin C (VITAMIN C) 1000 MG tablet Take 1 tablet (1,000 mg total) by mouth daily. (Patient not taking: Reported on 02/01/2018) 30 tablet 0   No current facility-administered medications on file prior to visit.      Past Medical History:  Diagnosis Date  . Allergy   . Anemia   . Arthritis   . GERD (gastroesophageal reflux disease)   . Hyperlipidemia    Allergies  Allergen Reactions  . Sulfur Swelling and Other (See Comments)    Reaction:  All over body swelling   . Penicillins Rash and Other (See Comments)    Has patient had a PCN reaction causing immediate rash, facial/tongue/throat swelling, SOB or lightheadedness with hypotension: No Has patient had a PCN reaction causing severe rash involving mucus membranes or skin necrosis: No Has patient had a PCN reaction that required hospitalization No Has patient had a PCN reaction occurring within the last 10 years: No If all of the above answers are "NO", then may proceed with Cephalosporin use.    Social History   Socioeconomic History  . Marital status: Married    Spouse name: Not on file  . Number of children: Not on file  . Years of education: Not on file  . Highest education level: Not on file  Occupational History  . Not on file  Social Needs  . Financial resource strain: Not on file  . Food insecurity:    Worry: Not on file    Inability: Not on file  . Transportation needs:    Medical: Not on file    Non-medical: Not on file  Tobacco  Use  . Smoking status: Former Research scientist (life sciences)  . Smokeless tobacco: Never Used  Substance and Sexual Activity  . Alcohol use: No  . Drug use: No  . Sexual activity: Not on file  Lifestyle  . Physical activity:    Days per week: Not on file    Minutes per session: Not on file  . Stress: Not on file  Relationships  . Social connections:    Talks on phone: Not on file    Gets together: Not on file    Attends religious service: Not on file    Active member of club or organization: Not on file    Attends meetings of clubs or organizations: Not on file    Relationship status: Not on file  Other  Topics Concern  . Not on file  Social History Narrative  . Not on file    Vitals:   02/01/18 0752  BP: 124/70  Pulse: 89  Resp: 12  Temp: 97.9 F (36.6 C)  SpO2: 97%   Body mass index is 29.74 kg/m.   Physical Exam  Nursing note and vitals reviewed. Constitutional: She is oriented to person, place, and time. She appears well-developed. No distress.  HENT:  Head: Atraumatic.  Mouth/Throat: Oropharynx is clear and moist and mucous membranes are normal.  Eyes: Conjunctivae and EOM are normal. Pupils are equal, round, and reactive to light.  Cardiovascular: Normal rate and regular rhythm.  No murmur heard. Pulses:      Dorsalis pedis pulses are 2+ on the right side, and 2+ on the left side.  Respiratory: Effort normal and breath sounds normal. No respiratory distress.  GI: Soft. She exhibits no mass. There is no hepatomegaly. There is no tenderness.  Musculoskeletal: She exhibits no edema.  No signs of synovitis. Crepitus knee bilateral. Normal shoulder ROM. No signs of synovitis.  Lymphadenopathy:    She has no cervical adenopathy.  Neurological: She is alert and oriented to person, place, and time. She has normal strength. Gait normal.  Skin: Skin is warm. No rash noted. No erythema.  Psychiatric: Her mood appears anxious. Her affect is labile. She expresses no suicidal ideation.    Well groomed, good eye contact.     ASSESSMENT AND PLAN:   [name] was seen today for 5 months follow-up.  Orders Placed This Encounter  Procedures  . Lipid panel  . CBC with Differential/Platelet  . Comprehensive metabolic panel   Lab Results  Component Value Date   ALT 26 02/01/2018   AST 29 02/01/2018   ALKPHOS 68 02/01/2018   BILITOT 0.4 02/01/2018   Lab Results  Component Value Date   CREATININE 0.79 02/01/2018   BUN 16 02/01/2018   NA 140 02/01/2018   K 4.5 02/01/2018   CL 103 02/01/2018   CO2 27 02/01/2018   Lab Results  Component Value Date   CHOL 190 02/01/2018   HDL 51.00 02/01/2018   LDLCALC 107 (H) 02/01/2018   TRIG 159.0 (H) 02/01/2018   CHOLHDL 4 02/01/2018    Essential hypertension, benign Adequately controlled. No changes in current management. DASH diet recommended. Eye exam recommended annually. F/U in 6 months, before if needed.   Generalized osteoarthritis of multiple sites Pain well controlled with current management. No changes in Tramadol or Mobic. F/U in 4 months.  Hyperlipidemia No changes in current management, will follow labs done today and will give further recommendations accordingly. Low fat diet recommended. F/U in 6-12 months.   Chronic allergic rhinitis  Well controlled. No changes in current management. F/U in 6-12 months.  Depression, major, single episode, mild ([place])  After discussion of a few pharmacologic options,she agrees with trying Celexa 10 mg daily. Side effects discussed. Also recommended behavioral therapy. Instructed about warning signs. F/U in 6-8 weeks.    -[name] was advised to return sooner than planned today if new concerns arise.       [name]. [name], MD  [facility]. [place] office.

## 2018-02-01 ENCOUNTER — Encounter: Payer: Self-pay | Admitting: Family Medicine

## 2018-02-01 ENCOUNTER — Ambulatory Visit (INDEPENDENT_AMBULATORY_CARE_PROVIDER_SITE_OTHER): Payer: Medicare Other | Admitting: Family Medicine

## 2018-02-01 ENCOUNTER — Other Ambulatory Visit: Payer: Self-pay | Admitting: Family Medicine

## 2018-02-01 VITALS — BP 124/70 | HR 89 | Temp 97.9°F | Resp 12 | Ht 59.0 in | Wt 147.2 lb

## 2018-02-01 DIAGNOSIS — M159 Polyosteoarthritis, unspecified: Secondary | ICD-10-CM

## 2018-02-01 DIAGNOSIS — E785 Hyperlipidemia, unspecified: Secondary | ICD-10-CM

## 2018-02-01 DIAGNOSIS — I10 Essential (primary) hypertension: Secondary | ICD-10-CM | POA: Diagnosis not present

## 2018-02-01 DIAGNOSIS — F320 Major depressive disorder, single episode, mild: Secondary | ICD-10-CM

## 2018-02-01 DIAGNOSIS — J309 Allergic rhinitis, unspecified: Secondary | ICD-10-CM | POA: Diagnosis not present

## 2018-02-01 LAB — COMPREHENSIVE METABOLIC PANEL
ALT: 26 U/L (ref 0–35)
AST: 29 U/L (ref 0–37)
Albumin: 4.8 g/dL (ref 3.5–5.2)
Alkaline Phosphatase: 68 U/L (ref 39–117)
BUN: 16 mg/dL (ref 6–23)
CHLORIDE: 103 meq/L (ref 96–112)
CO2: 27 meq/L (ref 19–32)
Calcium: 10 mg/dL (ref 8.4–10.5)
Creatinine, Ser: 0.79 mg/dL (ref 0.40–1.20)
GFR: 76.04 mL/min (ref >60.00)
Glucose, Bld: 107 mg/dL — ABNORMAL HIGH (ref 70–99)
Potassium: 4.5 mEq/L (ref 3.5–5.1)
SODIUM: 140 meq/L (ref 135–145)
Total Bilirubin: 0.4 mg/dL (ref 0.2–1.2)
Total Protein: 6.9 g/dL (ref 6.0–8.3)

## 2018-02-01 LAB — LIPID PANEL
CHOLESTEROL: 190 mg/dL (ref 0–200)
HDL: 51 mg/dL (ref >39.00)
LDL CALC: 107 mg/dL — AB (ref 0–99)
NonHDL: 138.91
TRIGLYCERIDES: 159 mg/dL — AB (ref 0.0–149.0)
Total CHOL/HDL Ratio: 4
VLDL: 31.8 mg/dL (ref 0.0–40.0)

## 2018-02-01 LAB — CBC WITH DIFFERENTIAL/PLATELET
BASOS PCT: 0.3 % (ref 0.0–3.0)
Basophils Absolute: 0 10*3/uL (ref 0.0–0.1)
EOS ABS: 0.2 10*3/uL (ref 0.0–0.7)
Eosinophils Relative: 3.4 % (ref 0.0–5.0)
HCT: 33.8 % — ABNORMAL LOW (ref 36.0–46.0)
Hemoglobin: 11.7 g/dL — ABNORMAL LOW (ref 12.0–15.0)
LYMPHS ABS: 1.6 10*3/uL (ref 0.7–4.0)
Lymphocytes Relative: 31.4 % (ref 12.0–46.0)
MCHC: 34.7 g/dL (ref 30.0–36.0)
MCV: 84 fl (ref 78.0–100.0)
MONO ABS: 0.5 10*3/uL (ref 0.1–1.0)
Monocytes Relative: 8.9 % (ref 3.0–12.0)
NEUTROS ABS: 2.9 10*3/uL (ref 1.4–7.7)
NEUTROS PCT: 56 % (ref 43.0–77.0)
PLATELETS: 293 10*3/uL (ref 150.0–400.0)
RBC: 4.03 Mil/uL (ref 3.87–5.11)
RDW: 13.5 % (ref 11.5–15.5)
WBC: 5.2 10*3/uL (ref 4.0–10.5)

## 2018-02-01 MED ORDER — LOSARTAN POTASSIUM 50 MG PO TABS: 50.0000 mg | ORAL_TABLET | Freq: Every day | ORAL | 2 refills | Status: DC

## 2018-02-01 MED ORDER — CITALOPRAM HYDROBROMIDE 10 MG PO TABS: 10.0000 mg | ORAL_TABLET | Freq: Every day | ORAL | 1 refills | Status: DC

## 2018-02-01 MED ORDER — FLUTICASONE PROPIONATE 50 MCG/ACT NA SUSP: 1.0000 | Freq: Two times a day (BID) | NASAL | 11 refills | Status: DC | PRN

## 2018-02-01 NOTE — Assessment & Plan Note (Signed)
Pain well controlled with current management. No changes in Tramadol or Mobic. F/U in 4 months.

## 2018-02-01 NOTE — Telephone Encounter (Signed)
Medication was just started today, so please fill 30 days supply as written on Rx.  Thanks, BJ

## 2018-02-01 NOTE — Patient Instructions (Signed)
A few things to remember from today's visit:   Generalized osteoarthritis of multiple sites  Depression, major, single episode, mild (HCC) - Plan: citalopram (CELEXA) 10 MG tablet  Chronic allergic rhinitis - Plan: fluticasone (FLONASE) 50 MCG/ACT nasal spray  Essential hypertension, benign - Plan: losartan (COZAAR) 50 MG tablet, CBC with Differential/Platelet, Comprehensive metabolic panel  Hyperlipidemia, unspecified hyperlipidemia type - Plan: Lipid panel, Comprehensive metabolic panel  Today we started Celexa, this type of medications can increase suicidal risk. This is more prevalent among children,adolecents, and young adults with major depression or other psychiatric disorders. It can also make depression worse. Most common side effects are gastrointestinal, self limited after a few weeks: diarrhea, nausea, constipation  Or diarrhea among some.  In general it is well tolerated. We will follow closely.      Please be sure medication list is accurate. If a new problem present, please set up appointment sooner than planned today.

## 2018-02-01 NOTE — Assessment & Plan Note (Signed)
No changes in current management, will follow labs done today and will give further recommendations accordingly. Low fat diet recommended. F/U in 6-12 months.

## 2018-02-01 NOTE — Assessment & Plan Note (Signed)
Adequately controlled. No changes in current management. DASH diet recommended. Eye exam recommended annually. F/U in 6 months, before if needed.  

## 2018-02-05 ENCOUNTER — Encounter: Payer: Self-pay | Admitting: Family Medicine

## 2018-03-04 ENCOUNTER — Other Ambulatory Visit: Payer: Self-pay | Admitting: Family Medicine

## 2018-03-21 NOTE — Progress Notes (Signed)
HPI:   Tabitha Adams is a 72 y.o. female, who is here today to follow on recent OV.   She was seen on 02/01/18, when she was c/o depressed mood.She lost her husband a few months.  Celexa 10 mg was started. She has noted great improvement in her mood, depression and anxiety. She is alternating between 10 and 5 mg every other day. Sleeping about 7 to 8 hours. No side effects. No suicidal thoughts.    Today she is also complaining of 4 days of sore throat, the day before problem started she had body aches. She has not noticed fever but has had some chills. Mild nonproductive cough, no wheezing or dyspnea.  No sick contact or recent travel. She has not try OTC medication.   Review of Systems  Constitutional: Positive for chills and fatigue. Negative for activity change, appetite change and fever.  HENT: Positive for congestion, postnasal drip, rhinorrhea and sore throat. Negative for ear pain, mouth sores, sinus pressure, trouble swallowing and voice change.   Eyes: Negative for discharge and redness.  Respiratory: Positive for cough. Negative for shortness of breath and wheezing.   Cardiovascular: Negative for leg swelling.  Gastrointestinal: Negative for abdominal pain, diarrhea, nausea and vomiting.  Musculoskeletal: Positive for arthralgias and myalgias. Negative for gait problem.  Skin: Negative for rash.  Allergic/Immunologic: Positive for environmental allergies.  Neurological: Negative for syncope, weakness and headaches.  Hematological: Negative for adenopathy.  Psychiatric/Behavioral: Negative for confusion. The patient is not nervous/anxious.       Current Outpatient Medications on File Prior to Visit  Medication Sig Dispense Refill  . b complex vitamins capsule Take 1 capsule by mouth daily.    . cetirizine (ZYRTEC) 10 MG tablet Take 10 mg by mouth at bedtime.    . citalopram (CELEXA) 10 MG tablet Take 1 tablet (10 mg total) by mouth daily. 30  tablet 1  . ferrous sulfate 325 (65 FE) MG tablet Take 325 mg by mouth every Monday, Wednesday, and Friday.    . fluticasone (FLONASE) 50 MCG/ACT nasal spray Place 1 spray into both nostrils 2 (two) times daily as needed for allergies or rhinitis. 16 g 11  . glucosamine-chondroitin 500-400 MG tablet Take 1 tablet by mouth 2 (two) times daily.    Marland Kitchen losartan (COZAAR) 50 MG tablet Take 1 tablet (50 mg total) by mouth daily. 90 tablet 2  . meclizine (ANTIVERT) 25 MG tablet Take 1 tablet (25 mg total) by mouth 3 (three) times daily as needed for dizziness. 45 tablet 1  . meloxicam (MOBIC) 15 MG tablet TAKE 1 TABLET BY MOUTH  DAILY 90 tablet 1  . montelukast (SINGULAIR) 10 MG tablet TAKE 1 TABLET BY MOUTH AT  BEDTIME 90 tablet 3  . Multiple Vitamin (MULTIVITAMIN WITH MINERALS) TABS tablet Take 1 tablet by mouth daily.    Marland Kitchen omega-3 acid ethyl esters (LOVAZA) 1 g capsule Take 1-2 g by mouth 2 (two) times daily. Pt takes one capsule in the morning and two at night.    Marland Kitchen omeprazole (PRILOSEC) 20 MG capsule TAKE 1 CAPSULE BY MOUTH  DAILY 90 capsule 3  . pravastatin (PRAVACHOL) 20 MG tablet TAKE 1 TABLET BY MOUTH  DAILY 90 tablet 1  . traMADol (ULTRAM) 50 MG tablet TAKE 1 TABLET BY MOUTH AT BEDTIME 30 tablet 2  . vitamin C (VITAMIN C) 1000 MG tablet Take 1 tablet (1,000 mg total) by mouth daily. (Patient not taking: Reported on 02/01/2018)  30 tablet 0   No current facility-administered medications on file prior to visit.      Past Medical History:  Diagnosis Date  . Allergy   . Anemia   . Arthritis   . GERD (gastroesophageal reflux disease)   . Hyperlipidemia    Allergies  Allergen Reactions  . Sulfur Swelling and Other (See Comments)    Reaction:  All over body swelling   . Penicillins Rash and Other (See Comments)    Has patient had a PCN reaction causing immediate rash, facial/tongue/throat swelling, SOB or lightheadedness with hypotension: No Has patient had a PCN reaction causing severe rash  involving mucus membranes or skin necrosis: No Has patient had a PCN reaction that required hospitalization No Has patient had a PCN reaction occurring within the last 10 years: No If all of the above answers are "NO", then may proceed with Cephalosporin use.    Social History   Socioeconomic History  . Marital status: Married    Spouse name: Not on file  . Number of children: Not on file  . Years of education: Not on file  . Highest education level: Not on file  Occupational History  . Not on file  Social Needs  . Financial resource strain: Not on file  . Food insecurity:    Worry: Not on file    Inability: Not on file  . Transportation needs:    Medical: Not on file    Non-medical: Not on file  Tobacco Use  . Smoking status: Former Research scientist (life sciences)  . Smokeless tobacco: Never Used  Substance and Sexual Activity  . Alcohol use: No  . Drug use: No  . Sexual activity: Not on file  Lifestyle  . Physical activity:    Days per week: Not on file    Minutes per session: Not on file  . Stress: Not on file  Relationships  . Social connections:    Talks on phone: Not on file    Gets together: Not on file    Attends religious service: Not on file    Active member of club or organization: Not on file    Attends meetings of clubs or organizations: Not on file    Relationship status: Not on file  Other Topics Concern  . Not on file  Social History Narrative  . Not on file    Vitals:   03/22/18 0933  BP: 114/66  Pulse: 72  Temp: 98.7 F (37.1 C)  SpO2: 96%   Body mass index is 29.36 kg/m.    Physical Exam  Nursing note and vitals reviewed. Constitutional: She is oriented to person, place, and time. She appears well-developed. She does not appear ill. No distress.  HENT:  Head: Normocephalic and atraumatic.  Right Ear: Tympanic membrane, external ear and ear canal normal.  Left Ear: Tympanic membrane, external ear and ear canal normal.  Mouth/Throat: Uvula is midline and  mucous membranes are normal. Posterior oropharyngeal erythema present. No oropharyngeal exudate or posterior oropharyngeal edema.  Eyes: Conjunctivae are normal.  Cardiovascular: Normal rate and regular rhythm.  No murmur heard. Respiratory: Effort normal and breath sounds normal. No respiratory distress.  Musculoskeletal: She exhibits no edema.  Lymphadenopathy:       Head (right side): No submandibular adenopathy present.       Head (left side): No submandibular adenopathy present.    She has no cervical adenopathy.  Neurological: She is alert and oriented to person, place, and time. She has normal strength.  Skin: Skin is warm. No rash noted. No erythema.  Psychiatric: She has a normal mood and affect. Her speech is normal.  Well groomed, good eye contact.    ASSESSMENT AND PLAN:   Ms. Mery was seen today for follow-up and sore throat.  Orders Placed This Encounter  Procedures  . Culture, Group A Strep  . POC Rapid Strep A     Sore throat -     POC Rapid Strep A  Acute pharyngitis, unspecified etiology  Most likely viral. Symptomatic treatment recommended for now: Gargles with saline and throat lozenges as well as acetaminophen. We will follow strep culture and give recommendations accordingly.  -     Culture, Group A Strep  Major depressive disorder with single episode, remission status unspecified  Reporting great improvement. She will continue Celexa, no changes in dose. Follow-up in 3 to 4 months.     Karrissa Parchment G. [name], MD  [facility]. [place] office.

## 2018-03-22 ENCOUNTER — Ambulatory Visit (INDEPENDENT_AMBULATORY_CARE_PROVIDER_SITE_OTHER): Payer: Medicare Other | Admitting: Family Medicine

## 2018-03-22 ENCOUNTER — Encounter: Payer: Self-pay | Admitting: Family Medicine

## 2018-03-22 VITALS — BP 114/66 | HR 72 | Temp 98.7°F | Resp 12 | Ht 59.0 in | Wt 145.4 lb

## 2018-03-22 DIAGNOSIS — J029 Acute pharyngitis, unspecified: Secondary | ICD-10-CM | POA: Diagnosis not present

## 2018-03-22 DIAGNOSIS — F329 Major depressive disorder, single episode, unspecified: Secondary | ICD-10-CM | POA: Diagnosis not present

## 2018-03-22 DIAGNOSIS — F330 Major depressive disorder, recurrent, mild: Secondary | ICD-10-CM | POA: Insufficient documentation

## 2018-03-22 LAB — POCT RAPID STREP A (OFFICE): Rapid Strep A Screen: NEGATIVE

## 2018-03-22 NOTE — Patient Instructions (Addendum)
A few things to remember from today's visit:   Sore throat - Plan: POC Rapid Strep A  Acute pharyngitis, unspecified etiology  Major depressive disorder with single episode, remission status unspecified No changes in Celexa.   Symptomatic treatment: Over the counter Acetaminophen 500 mg and/or Ibuprofen (400-600 mg) if there is not contraindications; you can alternate in between both every 4-6 hours. Gargles with saline water and throat lozenges might also help. Cold fluids.    Seek prompt medical evaluation if you are having difficulty breathing, mouth swelling, throat closing up, not able to swallow liquids (drooling), skin rash/bruising, or worsening symptoms.  Please follow up in 2 weeks if not any better.    Please be sure medication list is accurate. If a new problem present, please set up appointment sooner than planned today.

## 2018-03-23 ENCOUNTER — Encounter: Payer: Self-pay | Admitting: Family Medicine

## 2018-03-24 ENCOUNTER — Encounter: Payer: Self-pay | Admitting: Family Medicine

## 2018-03-24 LAB — CULTURE, GROUP A STREP
MICRO NUMBER:: 90561819
SPECIMEN QUALITY: ADEQUATE

## 2018-04-11 ENCOUNTER — Other Ambulatory Visit: Payer: Self-pay | Admitting: Family Medicine

## 2018-04-11 DIAGNOSIS — G894 Chronic pain syndrome: Secondary | ICD-10-CM

## 2018-04-11 DIAGNOSIS — M159 Polyosteoarthritis, unspecified: Secondary | ICD-10-CM

## 2018-04-11 DIAGNOSIS — F320 Major depressive disorder, single episode, mild: Secondary | ICD-10-CM

## 2018-06-14 ENCOUNTER — Other Ambulatory Visit: Payer: Self-pay

## 2018-07-08 ENCOUNTER — Other Ambulatory Visit: Payer: Self-pay | Admitting: Family Medicine

## 2018-07-08 DIAGNOSIS — M159 Polyosteoarthritis, unspecified: Secondary | ICD-10-CM

## 2018-07-08 DIAGNOSIS — G894 Chronic pain syndrome: Secondary | ICD-10-CM

## 2018-07-22 ENCOUNTER — Other Ambulatory Visit: Payer: Self-pay | Admitting: Family Medicine

## 2018-07-22 DIAGNOSIS — E785 Hyperlipidemia, unspecified: Secondary | ICD-10-CM

## 2018-07-23 NOTE — Progress Notes (Signed)
HPI:   Tabitha Adams is a 72 y.o. female, who is here today for 6 months follow up.   She was last seen on 03/22/18.   Chronic pain: Generalized OA, she is on Tramadol 50 mg,which she takes daily as needed. She also takes Mobic 15 mg daily as needed. Arthralgias in IP hands and toes, lower back,and knees. Pain is exacerbated by prolonged walking and standing,repetitive manual activities. + Stiffness.  Tolerating medication well,no side effects.   HTN:  On Cozzar 50 mg daily.  Lab Results  Component Value Date   CREATININE 0.79 02/01/2018   BUN 16 02/01/2018   NA 140 02/01/2018   K 4.5 02/01/2018   CL 103 02/01/2018   CO2 27 02/01/2018   Denies severe/frequent headache, visual changes, chest pain, dyspnea, palpitation, claudication, focal weakness, or edema.  She is not checking BP at home.  Depression: Aggravated by the dead of her husband. She is on Celexa 10 mg daily.  She finally sold her house in [place] and bought a house here in [place] to her son.  Celexa is helping with anxiety and depression. She has not noted side effects. No suicidal thoughts.    Today she is complaining about hand tremor, which she has had for many years and has been stable. Problem seems to be worse in the morning,L>R Right handed. It is exacerbated by certain activities like holding a spoon or other manual activities. Alleviated by rest. Mother Hx of tremor.   Review of Systems  Constitutional: Negative for activity change, appetite change, fatigue and fever.  HENT: Negative for mouth sores, nosebleeds and trouble swallowing.   Eyes: Negative for pain and visual disturbance.  Respiratory: Negative for cough, shortness of breath and wheezing.   Cardiovascular: Negative for chest pain, palpitations and leg swelling.  Gastrointestinal: Negative for abdominal pain, nausea and vomiting.       Negative for changes in bowel habits.  Genitourinary: Negative  for decreased urine volume and hematuria.  Musculoskeletal: Positive for arthralgias. Negative for gait problem and joint swelling.  Allergic/Immunologic: Positive for environmental allergies.  Neurological: Positive for tremors. Negative for syncope, weakness and headaches.  Psychiatric/Behavioral: Negative for confusion and suicidal ideas. The patient is nervous/anxious.      Current Outpatient Medications on File Prior to Visit  Medication Sig Dispense Refill  . b complex vitamins capsule Take 1 capsule by mouth daily.    . cetirizine (ZYRTEC) 10 MG tablet Take 10 mg by mouth at bedtime.    . citalopram (CELEXA) 10 MG tablet TAKE 1 TABLET(10 MG) BY MOUTH DAILY 90 tablet 1  . ferrous sulfate 325 (65 FE) MG tablet Take 325 mg by mouth every Monday, Wednesday, and Friday.    . fluticasone (FLONASE) 50 MCG/ACT nasal spray Place 1 spray into both nostrils 2 (two) times daily as needed for allergies or rhinitis. 16 g 11  . glucosamine-chondroitin 500-400 MG tablet Take 1 tablet by mouth 2 (two) times daily.    Marland Kitchen losartan (COZAAR) 50 MG tablet Take 1 tablet (50 mg total) by mouth daily. 90 tablet 2  . meclizine (ANTIVERT) 25 MG tablet Take 1 tablet (25 mg total) by mouth 3 (three) times daily as needed for dizziness. 45 tablet 1  . meloxicam (MOBIC) 15 MG tablet TAKE 1 TABLET BY MOUTH  DAILY 90 tablet 1  . montelukast (SINGULAIR) 10 MG tablet TAKE 1 TABLET BY MOUTH AT  BEDTIME 90 tablet 3  . Multiple Vitamin (  MULTIVITAMIN WITH MINERALS) TABS tablet Take 1 tablet by mouth daily.    Marland Kitchen omega-3 acid ethyl esters (LOVAZA) 1 g capsule Take 1-2 g by mouth 2 (two) times daily. Pt takes one capsule in the morning and two at night.    Marland Kitchen omeprazole (PRILOSEC) 20 MG capsule TAKE 1 CAPSULE BY MOUTH  DAILY 90 capsule 3  . pravastatin (PRAVACHOL) 20 MG tablet TAKE 1 TABLET BY MOUTH  DAILY 90 tablet 1  . traMADol (ULTRAM) 50 MG tablet TAKE 1 TABLET BY MOUTH AT BEDTIME 30 tablet 2  . vitamin C (VITAMIN C) 1000  MG tablet Take 1 tablet (1,000 mg total) by mouth daily. 30 tablet 0   No current facility-administered medications on file prior to visit.      Past Medical History:  Diagnosis Date  . Allergy   . Anemia   . Arthritis   . GERD (gastroesophageal reflux disease)   . Hyperlipidemia    Allergies  Allergen Reactions  . Sulfur Swelling and Other (See Comments)    Reaction:  All over body swelling   . Penicillins Rash and Other (See Comments)    Has patient had a PCN reaction causing immediate rash, facial/tongue/throat swelling, SOB or lightheadedness with hypotension: No Has patient had a PCN reaction causing severe rash involving mucus membranes or skin necrosis: No Has patient had a PCN reaction that required hospitalization No Has patient had a PCN reaction occurring within the last 10 years: No If all of the above answers are "NO", then may proceed with Cephalosporin use.    Social History   Socioeconomic History  . Marital status: Married    Spouse name: Not on file  . Number of children: Not on file  . Years of education: Not on file  . Highest education level: Not on file  Occupational History  . Not on file  Social Needs  . Financial resource strain: Not on file  . Food insecurity:    Worry: Not on file    Inability: Not on file  . Transportation needs:    Medical: Not on file    Non-medical: Not on file  Tobacco Use  . Smoking status: Former Research scientist (life sciences)  . Smokeless tobacco: Never Used  Substance and Sexual Activity  . Alcohol use: No  . Drug use: No  . Sexual activity: Not on file  Lifestyle  . Physical activity:    Days per week: Not on file    Minutes per session: Not on file  . Stress: Not on file  Relationships  . Social connections:    Talks on phone: Not on file    Gets together: Not on file    Attends religious service: Not on file    Active member of club or organization: Not on file    Attends meetings of clubs or organizations: Not on file     Relationship status: Not on file  Other Topics Concern  . Not on file  Social History Narrative  . Not on file    Vitals:   07/24/18 0849  BP: 116/70  Pulse: 76  Resp: 12  Temp: 98.3 F (36.8 C)  SpO2: 96%   Body mass index is 27.54 kg/m.   Physical Exam  Nursing note and vitals reviewed. Constitutional: She is oriented to person, place, and time. She appears well-developed. No distress.  HENT:  Head: Normocephalic and atraumatic.  Mouth/Throat: Oropharynx is clear and moist and mucous membranes are normal.  Eyes: Pupils are  equal, round, and reactive to light. Conjunctivae are normal.  Cardiovascular: Normal rate and regular rhythm.  No murmur heard. Pulses:      Dorsalis pedis pulses are 2+ on the right side, and 2+ on the left side.  Respiratory: Effort normal and breath sounds normal. No respiratory distress.  GI: Soft. She exhibits no mass. There is no hepatomegaly. There is no tenderness.  Musculoskeletal: She exhibits no edema.  Lymphadenopathy:    She has no cervical adenopathy.  Neurological: She is alert and oriented to person, place, and time. She has normal strength. She displays tremor. No cranial nerve deficit. Gait normal.  Negative pronator drift.  Skin: Skin is warm. No rash noted. No erythema.  Psychiatric: She has a normal mood and affect.  Well groomed, good eye contact.       ASSESSMENT AND PLAN:   Tabitha Adams was seen today for 6 months follow-up.  Orders Placed This Encounter  Procedures  . Flu vaccine HIGH DOSE PF  . Basic metabolic panel  . TSH   Lab Results  Component Value Date   TSH 0.86 07/24/2018   Lab Results  Component Value Date   CREATININE 0.79 07/24/2018   BUN 14 07/24/2018   NA 134 (L) 07/24/2018   K 4.3 07/24/2018   CL 99 07/24/2018   CO2 28 07/24/2018    Generalized osteoarthritis of multiple sites Problem is otherwise stable. She will continue tramadol and Mobic. We discussed side effects of  medications and the risk of interaction with Celexa. Follow-up in 6 months.  Essential hypertension, benign Adequately controlled. Continue Cozaar 50 mg daily. Eye exam is current. Continue low-salt diet. Follow-up in 6 months, before if needed.  Depression, major, single episode Well-controlled. No changes in Celexa 10 mg daily. Follow-up in 6 months, before if needed.  Benign essential tremor We discussed possible tetiologies of tremor. Most likely essential tremor. Neurologic examination is negative and tremor has been stable for years. We will continue monitoring.   Chronic pain disorder Pain is otherwise well controlled. She will continue Mobic 15 mg daily and tramadol 50 mg daily as needed. We discussed side effects of medications. We have a verbal med contract for tramadol. Follow-up in 6 months.      [name]. [name], MD  [facility]. [place] office.

## 2018-07-24 ENCOUNTER — Ambulatory Visit (INDEPENDENT_AMBULATORY_CARE_PROVIDER_SITE_OTHER): Payer: Medicare Other | Admitting: Family Medicine

## 2018-07-24 ENCOUNTER — Encounter: Payer: Self-pay | Admitting: Family Medicine

## 2018-07-24 VITALS — BP 116/70 | HR 76 | Temp 98.3°F | Resp 12 | Ht 59.0 in | Wt 136.4 lb

## 2018-07-24 DIAGNOSIS — I10 Essential (primary) hypertension: Secondary | ICD-10-CM

## 2018-07-24 DIAGNOSIS — M159 Polyosteoarthritis, unspecified: Secondary | ICD-10-CM | POA: Diagnosis not present

## 2018-07-24 DIAGNOSIS — G250 Essential tremor: Secondary | ICD-10-CM | POA: Diagnosis not present

## 2018-07-24 DIAGNOSIS — F329 Major depressive disorder, single episode, unspecified: Secondary | ICD-10-CM | POA: Diagnosis not present

## 2018-07-24 DIAGNOSIS — G894 Chronic pain syndrome: Secondary | ICD-10-CM | POA: Diagnosis not present

## 2018-07-24 DIAGNOSIS — Z23 Encounter for immunization: Secondary | ICD-10-CM

## 2018-07-24 LAB — BASIC METABOLIC PANEL
BUN: 14 mg/dL (ref 6–23)
CALCIUM: 9.5 mg/dL (ref 8.4–10.5)
CO2: 28 mEq/L (ref 19–32)
Chloride: 99 mEq/L (ref 96–112)
Creatinine, Ser: 0.79 mg/dL (ref 0.40–1.20)
GFR: 75.94 mL/min (ref >60.00)
Glucose, Bld: 82 mg/dL (ref 70–99)
POTASSIUM: 4.3 meq/L (ref 3.5–5.1)
SODIUM: 134 meq/L — AB (ref 135–145)

## 2018-07-24 LAB — TSH: TSH: 0.86 u[IU]/mL (ref 0.35–4.50)

## 2018-07-24 NOTE — Assessment & Plan Note (Signed)
Adequately controlled. Continue Cozaar 50 mg daily. Eye exam is current. Continue low-salt diet. Follow-up in 6 months, before if needed.

## 2018-07-24 NOTE — Assessment & Plan Note (Signed)
Problem is otherwise stable. She will continue tramadol and Mobic. We discussed side effects of medications and the risk of interaction with Celexa. Follow-up in 6 months.

## 2018-07-24 NOTE — Assessment & Plan Note (Signed)
We discussed possible tetiologies of tremor. Most likely essential tremor. Neurologic examination is negative and tremor has been stable for years. We will continue monitoring.

## 2018-07-24 NOTE — Assessment & Plan Note (Signed)
Pain is otherwise well controlled. She will continue Mobic 15 mg daily and tramadol 50 mg daily as needed. We discussed side effects of medications. We have a verbal med contract for tramadol. Follow-up in 6 months.

## 2018-07-24 NOTE — Assessment & Plan Note (Signed)
Well-controlled. No changes in Celexa 10 mg daily. Follow-up in 6 months, before if needed.

## 2018-07-24 NOTE — Patient Instructions (Addendum)
A few things to remember from today's visit:   Generalized osteoarthritis of multiple sites  Essential hypertension, benign - Plan: Basic metabolic panel  Chronic pain disorder  Major depressive disorder with single episode, remission status unspecified  Benign essential tremor - Plan: TSH  Tremor A tremor is trembling or shaking that you cannot control. Most tremors affect the hands or arms. Tremors can also affect the head, vocal cords, face, and other parts of the body. There are many types of tremors. Common types include:  Essential tremor. These usually occur in people over the age of 72. It may run in families and can happen in otherwise healthy people.  Resting tremor. These occur when the muscles are at rest, such as when your hands are resting in your lap. People with Parkinson disease often have resting tremors.  Postural tremor. These occur when you try to hold a pose, such as keeping your hands outstretched.  Kinetic tremor. These occur during purposeful movement, such as trying to touch a finger to your nose.  Task-specific tremor. These may occur when you perform tasks such as handwriting, speaking, or standing.  Psychogenic tremor. These dramatically lessen or disappear when you are distracted. They can happen in people of all ages.  Some types of tremors have no known cause. Tremors can also be a symptom of nervous system problems (neurological disorders) that may occur with aging. Some tremors go away with treatment while others do not. Follow these instructions at home: Watch your tremor for any changes. The following actions may help to lessen any discomfort you are feeling:  Take medicines only as directed by your health care provider.  Limit alcohol intake to no more than 1 drink per day for nonpregnant women and 2 drinks per day for men. One drink equals 12 oz of beer, 5 oz of wine, or 1 oz of hard liquor.  Do not use any tobacco products, including  cigarettes, chewing tobacco, or electronic cigarettes. If you need help quitting, ask your health care provider.  Avoid extreme heat or cold.  Limit the amount of caffeine you consumeas directed by your health care provider.  Try to get 8 hours of sleep each night.  Find ways to manage your stress, such as meditation or yoga.  Keep all follow-up visits as directed by your health care provider. This is important.  Contact a health care provider if:  You start having a tremor after starting a new medicine.  You have tremor with other symptoms such as: ? Numbness. ? Tingling. ? Pain. ? Weakness.  Your tremor gets worse.  Your tremor interferes with your day-to-day life. This information is not intended to replace advice given to you by your health care provider. Make sure you discuss any questions you have with your health care provider. Document Released: 10/22/2002 Document Revised: 07/04/2016 Document Reviewed: 04/29/2014 Elsevier Interactive Patient Education  [employer].   I would like for you you to schedule a [employer] Visit (AWV).   This is a yearly appointment with our Health Coach Wynetta Fines, RN) and is designed to develop a personalized prevention plan. This is not a head to toe physical, but rather an opportunity to prevent illness based on your current health and risk factors for disease.   Visits usually last 30-60 minutes and include various screenings for hearing, vision, depression, and dementia, falls, and safety concerns. The visit also includes diet and exercise counseling and information about advance directives.   This  is also an opportunity to discuss appropriate health maintenance testing such as mammography, colonoscopy, lung cancer screening, and hepatitis C testing.   The AWV is fully covered by Medicare Part B if:  . You have had Part B for over 12 months, AND . You have not had an AWV in the past 12 months .  Please don't  miss out on this opportunity! Set up your appointment today!  Please be sure medication list is accurate. If a new problem present, please set up appointment sooner than planned today.

## 2018-07-25 ENCOUNTER — Encounter: Payer: Self-pay | Admitting: Family Medicine

## 2018-07-27 ENCOUNTER — Encounter: Payer: Self-pay | Admitting: Family Medicine

## 2018-08-04 DIAGNOSIS — H5203 Hypermetropia, bilateral: Secondary | ICD-10-CM | POA: Diagnosis not present

## 2018-08-04 DIAGNOSIS — H04123 Dry eye syndrome of bilateral lacrimal glands: Secondary | ICD-10-CM | POA: Diagnosis not present

## 2018-08-04 DIAGNOSIS — Z961 Presence of intraocular lens: Secondary | ICD-10-CM | POA: Diagnosis not present

## 2018-08-04 DIAGNOSIS — H524 Presbyopia: Secondary | ICD-10-CM | POA: Diagnosis not present

## 2018-08-04 DIAGNOSIS — H52223 Regular astigmatism, bilateral: Secondary | ICD-10-CM | POA: Diagnosis not present

## 2018-10-03 ENCOUNTER — Other Ambulatory Visit: Payer: Self-pay

## 2018-10-06 ENCOUNTER — Other Ambulatory Visit: Payer: Self-pay | Admitting: Family Medicine

## 2018-10-06 DIAGNOSIS — F320 Major depressive disorder, single episode, mild: Secondary | ICD-10-CM

## 2018-10-07 ENCOUNTER — Other Ambulatory Visit: Payer: Self-pay | Admitting: Family Medicine

## 2018-10-07 DIAGNOSIS — M159 Polyosteoarthritis, unspecified: Secondary | ICD-10-CM

## 2018-10-07 DIAGNOSIS — G894 Chronic pain syndrome: Secondary | ICD-10-CM

## 2018-10-23 ENCOUNTER — Other Ambulatory Visit: Payer: Self-pay | Admitting: Family Medicine

## 2018-10-23 DIAGNOSIS — I10 Essential (primary) hypertension: Secondary | ICD-10-CM

## 2018-11-22 ENCOUNTER — Other Ambulatory Visit: Payer: Self-pay | Admitting: Family Medicine

## 2018-11-22 DIAGNOSIS — Z1231 Encounter for screening mammogram for malignant neoplasm of breast: Secondary | ICD-10-CM

## 2018-12-18 ENCOUNTER — Ambulatory Visit: Payer: Self-pay

## 2018-12-18 NOTE — Telephone Encounter (Signed)
Pt. Reports she started having vomiting and diarrhea yesterday. No abdominal pain. Reviewed home remedies with pt. Reviewed signs of dehydration.States the vomiting is better today. Has 4 diarrhea stools today. Will call back tomorrow if no better.  Reason for Disposition . MILD or MODERATE vomiting (e.g., 1 - 5 times / day)  Answer Assessment - Initial Assessment Questions 1. VOMITING SEVERITY: "How many times have you vomited in the past 24 hours?"     - MILD:  1 - 2 times/day    - MODERATE: 3 - 5 times/day, decreased oral intake without significant weight loss or symptoms of dehydration    - SEVERE: 6 or more times/day, vomits everything or nearly everything, with significant weight loss, symptoms of dehydration      5 2. ONSET: "When did the vomiting begin?"      Started yesterday 3. FLUIDS: "What fluids or food have you vomited up today?" "Have you been able to keep any fluids down?"     Gatorade is staying down 4. ABDOMINAL PAIN: "Are your having any abdominal pain?" If yes : "How bad is it and what does it feel like?" (e.g., crampy, dull, intermittent, constant)      No pain 5. DIARRHEA: "Is there any diarrhea?" If so, ask: "How many times today?"      Yes - 4 today 6. CONTACTS: "Is there anyone else in the family with the same symptoms?"      No 7. CAUSE: "What do you think is causing your vomiting?"     Unsure 8. HYDRATION STATUS: "Any signs of dehydration?" (e.g., dry mouth [not only dry lips], too weak to stand) "When did you last urinate?"     Feels weak, but it up moving around the home 9. OTHER SYMPTOMS: "Do you have any other symptoms?" (e.g., fever, headache, vertigo, vomiting blood or coffee grounds, recent head injury)     No fever, does have chills 10. PREGNANCY: "Is there any chance you are pregnant?" "When was your last menstrual period?"       No  Protocols used: [facility]

## 2018-12-21 ENCOUNTER — Inpatient Hospital Stay: Admission: RE | Admit: 2018-12-21 | Payer: Medicare Other | Source: Ambulatory Visit

## 2019-01-19 ENCOUNTER — Ambulatory Visit
Admission: RE | Admit: 2019-01-19 | Discharge: 2019-01-19 | Disposition: A | Discharge status: Home / Self Care | Payer: Medicare Other | Source: Ambulatory Visit | Attending: Family Medicine | Admitting: Family Medicine

## 2019-01-19 DIAGNOSIS — Z1231 Encounter for screening mammogram for malignant neoplasm of breast: Secondary | ICD-10-CM

## 2019-01-19 NOTE — Progress Notes (Signed)
Subjective:   Tabitha Adams is a 73 y.o. female who presents for an Initial Medicare Annual Wellness Visit.  Review of Systems    No ROS.  Medicare Wellness Visit. Additional risk factors are reflected in the social history.   Cardiac Risk Factors include: advanced age (>68men, >55 women);hypertension;dyslipidemia Sleep patterns: feels rested on waking. Takes tramadol HS for back and hand pain and that helps her sleep as well.   Home Safety/Smoke Alarms: Feels safe in home. Smoke alarms in place.  Living environment; residence and Firearm Safety: 1-story house/ trailer. Has cane, tub bench, and other DME left over from husband if she should need it, but pt. States she has not needed anything yet.   Seat Belt Safety/Bike Helmet: Wears seat belt.   Female:   Pap- N/A d/t age      75- 01/19/2019, awaiting result interpretation        Dexa scan- overdue.Order placed.      [facility]- in 2017 in [place]. Pt. stated she was told to get re-done in 5 years, due 2022. Author advised to confirm.      Objective:    Today's Vitals   01/22/19 1619  BP: 110/60  Pulse: 80  Resp: 14  Temp: 98.3 F (36.8 C)  SpO2: 96%  Weight: 145 lb (65.8 kg)  Height: 4\' 11"  (1.499 m)  PainSc: 4   PainLoc: Hand   Body mass index is 29.29 kg/m.  Advanced Directives 01/22/2019 01/25/2017 12/02/2016  Does Patient Have a Medical Advance Directive? Yes No Yes  Type of Paramedic of [place];Living will - [place];Living will  Copy of [place] in Chart? No - copy requested - -  Would patient like information on creating a medical advance directive? - No - Patient declined -    Current Medications (verified) Outpatient Encounter Medications as of 01/22/2019  Medication Sig  . b complex vitamins capsule Take 1 capsule by mouth daily.  . Calcium-Phosphorus-Vitamin D (CALCIUM GUMMIES) [phone] MG-MG-UNIT CHEW Chew by mouth.  . cetirizine  (ZYRTEC) 10 MG tablet Take 10 mg by mouth at bedtime.  . Cholecalciferol (VITAMIN D3) 50 MCG (2000 UT) TABS Take by mouth daily.  . citalopram (CELEXA) 10 MG tablet TAKE 1 TABLET(10 MG) BY MOUTH DAILY (Patient taking differently: Takes 1/2 tablet (5mg ) daily)  . ferrous sulfate 325 (65 FE) MG tablet Take 325 mg by mouth every Monday, Wednesday, and Friday.  . fluticasone (FLONASE) 50 MCG/ACT nasal spray Place 1 spray into both nostrils 2 (two) times daily as needed for allergies or rhinitis.  Marland Kitchen glucosamine-chondroitin 500-400 MG tablet Take 1 tablet by mouth 2 (two) times daily.  Marland Kitchen losartan (COZAAR) 50 MG tablet TAKE 1 TABLET BY MOUTH  DAILY  . meclizine (ANTIVERT) 25 MG tablet Take 1 tablet (25 mg total) by mouth 3 (three) times daily as needed for dizziness.  . meloxicam (MOBIC) 15 MG tablet TAKE 1 TABLET BY MOUTH  DAILY  . Misc Natural Products (SUPER ENERGY HERBAL COMPLEX PO) Take by mouth.  . montelukast (SINGULAIR) 10 MG tablet TAKE 1 TABLET BY MOUTH AT  BEDTIME  . Multiple Vitamin (MULTIVITAMIN WITH MINERALS) TABS tablet Take 1 tablet by mouth daily.  Marland Kitchen omeprazole (PRILOSEC) 20 MG capsule TAKE 1 CAPSULE BY MOUTH  DAILY  . Potassium Gluconate 595 MG CAPS Take by mouth.  . pravastatin (PRAVACHOL) 20 MG tablet TAKE 1 TABLET BY MOUTH  DAILY  . traMADol (ULTRAM) 50 MG  tablet TAKE 1 TABLET BY MOUTH AT BEDTIME  . vitamin C (VITAMIN C) 1000 MG tablet Take 1 tablet (1,000 mg total) by mouth daily.  . [DISCONTINUED] omega-3 acid ethyl esters (LOVAZA) 1 g capsule Take 1-2 g by mouth 2 (two) times daily. Pt takes one capsule in the morning and two at night.   No facility-administered encounter medications on file as of 01/22/2019.     Allergies (verified) Sulfur and Penicillins   History: Past Medical History:  Diagnosis Date  . Allergy   . Anemia   . Arthritis   . GERD (gastroesophageal reflux disease)   . Hyperlipidemia    Past Surgical History:  Procedure Laterality Date  . BREAST  CYST ASPIRATION Left   . BREAST EXCISIONAL BIOPSY Left   . BREAST EXCISIONAL BIOPSY Left   . ORIF WRIST FRACTURE Right 01/25/2017   Procedure: OPEN REDUCTION INTERNAL FIXATION (ORIF) WRIST FRACTURE;  Surgeon: Roseanne Kaufman, MD;  Location: [facility];  Service: Orthopedics;  Laterality: Right;   Family History  Problem Relation Age of Onset  . Stroke Father   . Hypertension Father   . Alcohol abuse Father    Social History   Socioeconomic History  . Marital status: Widowed    Spouse name: Not on file  . Number of children: 3  . Years of education: Not on file  . Highest education level: Not on file  Occupational History  . Occupation: Surveyor, quantity: [employer]    Comment: works 25-30hrs/week  Social Needs  . Financial resource strain: Not hard at all  . Food insecurity:    Worry: Never true    Inability: Never true  . Transportation needs:    Medical: No    Non-medical: No  Tobacco Use  . Smoking status: Former Research scientist (life sciences)  . Smokeless tobacco: Never Used  Substance and Sexual Activity  . Alcohol use: No  . Drug use: No  . Sexual activity: Not Currently  Lifestyle  . Physical activity:    Days per week: 0 days    Minutes per session: 0 min  . Stress: Not at all  Relationships  . Social connections:    Talks on phone: Three times a week    Gets together: Twice a week    Attends religious service: More than 4 times per year    Active member of club or organization: Yes    Attends meetings of clubs or organizations: More than 4 times per year    Relationship status: Widowed  Other Topics Concern  . Not on file  Social History Narrative   01/22/2019:   Widowed since 10/2017; lives alone now in [place] house with 2 dogs   Has one son, and one stepson and Psychiatrist. Sons live close by, supportive.   Has 5 grandchildren and several great grandchildren, active in their lives   Continues to work PT as Scientist, water quality, likes to stay busy   [place] given: Not Answered    Activities of Daily Living In your present state of health, do you have any difficulty performing the following activities: 01/22/2019  Hearing? N  Vision? N  Difficulty concentrating or making decisions? N  Walking or climbing stairs? N  Dressing or bathing? N  Doing errands, shopping? N  Preparing Food and eating ? N  Using the Toilet? N  In the past six months, have you accidently leaked urine? N  Do you have problems  with loss of bowel control? N  Managing your Medications? N  Managing your Finances? N  Housekeeping or managing your Housekeeping? N  Some recent data might be hidden     Immunizations and Health Maintenance Immunization History  Administered Date(s) Administered  . Influenza, High Dose Seasonal PF 08/31/2016, 09/07/2017, 07/24/2018  . Pneumococcal Conjugate-13 01/22/2019  . Tdap 11/16/2015   Health Maintenance Due  Topic Date Due  . DEXA SCAN  02/15/2011    Patient Care Team: [name], [name], MD as PCP - General (Family Medicine) Marica Otter, [place] (Optometry)  Indicate any recent Medical Services you may have received from other than [facility] providers in the past year (date may be approximate).     Assessment:   This is a routine wellness examination for [place]. Physical assessment deferred to PCP.   Hearing/Vision screen Hearing Screening Comments: Able to hear conversational tones w/o difficulty. No issues reported. Recently had audiology exam and declined one at this time.  Vision Screening Comments: No acute vision issues. Recently saw Dr. Sabra Heck, optometrist, follows routinely. Declined snellen chart exam.  Dietary issues and exercise activities discussed: Current Exercise Habits: The patient has a physically strenuous job, but has no regular exercise apart from work., Exercise limited by: orthopedic condition(s);cardiac condition(s) Diet (meal preparation, eat out, water intake,  caffeinated beverages, dairy products, fruits and vegetables): in general, an "unhealthy" diet. Eats 2 meals/day. Mostly prepared foods, "has sweet tooth". Author encouraged pt. to avoid prepared or canned foods due to high sodium and sugar levels, and to increase foods that decrease inflammation, like those high in omega-3 fatty acids. Hand-outs provided on low inflammation diet and weight loss tips. Suggested idea of keeping a food log to follow trends.       Goals    . Patient Stated     Improve joint pain, reduce sugar intake. Continue staying active, fixing up house!      Depression Screen PHQ 2/9 Scores 01/22/2019  PHQ - 2 Score 1  PHQ- 9 Score 2    Fall Risk Fall Risk  01/22/2019 10/03/2018 06/14/2018  Falls in the past year? 0 1 Yes  Comment - Emmi Telephone Survey: data to providers prior to load Emmi Telephone Survey: data to providers prior to load  Number falls in past yr: - 1 1  Comment - Emmi Telephone Survey Actual Response = 4 Emmi Telephone Survey Actual Response = 1  Injury with Fall? - 0 Yes  Risk for fall due to : History of fall(s);Impaired balance/gait - -  Follow up Education provided;Falls prevention discussed - -     Cognitive Function:       Ad8 score reviewed for issues:  Issues making decisions: no  Less interest in hobbies / activities: no  Repeats questions, stories (family complaining): no  Trouble using ordinary gadgets (microwave, computer, phone):no  Forgets the month or year: no  Mismanaging finances: no  Remembering appts: no  Daily problems with thinking and/or memory: no Ad8 score is= 0    Screening Tests Health Maintenance  Topic Date Due  . DEXA SCAN  02/15/2011  . COLONOSCOPY  11/13/2021 (Originally 02/15/1996)  . PNA vac Low Risk Adult (2 of 2 - PPSV23) 01/22/2020  . MAMMOGRAM  01/18/2021  . TETANUS/TDAP  11/15/2025  . INFLUENZA VACCINE  Completed  . Hepatitis C Screening  Completed    Qualifies for Shingles Vaccine?  Yes, advised to receive at local pharmacy.      Plan:  Prevnar-13 (pneumona) vaccine given today.   Consider getting shingles vaccine (shingrix) at local pharmacy.  Bring a copy of your living will and/or healthcare power of attorney to your next office visit.  Continue doing brain stimulating activities (puzzles, reading, adult coloring books, staying active) to keep memory sharp.   Bone density scan placed. GI-breast center will call you to schedule at your convenience.  Let us know if you have not heard about the mammogram results by end of this week.   Colonoscopy due 2022 per repot; follow-up with previous GI to verify date and specific recommendation for repeat exam.  Plan to follow-up with Dr. [name] as scheduled re: joint pain.  Refer to diet education regarding reducing sugar and other offenders that promote inflammation in the body.   I have personally reviewed and noted the following in the patient's chart:   . Medical and social history . Use of alcohol, tobacco or illicit drugs  . Current medications and supplements . Functional ability and status . Nutritional status . Physical activity . Advanced directives . List of other physicians . Hospitalizations, surgeries, and ER visits in previous 12 months . Vitals . Screenings to include cognitive, depression, and falls . Referrals and appointments  In addition, I have reviewed and discussed with patient certain preventive protocols, quality metrics, and best practice recommendations. A written personalized care plan for preventive services as well as general preventive health recommendations were provided to patient.     Alphia Moh, RN   01/22/2019

## 2019-01-22 ENCOUNTER — Ambulatory Visit (INDEPENDENT_AMBULATORY_CARE_PROVIDER_SITE_OTHER): Payer: Medicare Other

## 2019-01-22 ENCOUNTER — Ambulatory Visit: Payer: Medicare Other

## 2019-01-22 VITALS — BP 110/60 | HR 80 | Temp 98.3°F | Resp 14 | Ht 59.0 in | Wt 145.0 lb

## 2019-01-22 DIAGNOSIS — Z0000 Encounter for general adult medical examination without abnormal findings: Secondary | ICD-10-CM | POA: Diagnosis not present

## 2019-01-22 DIAGNOSIS — Z13820 Encounter for screening for osteoporosis: Secondary | ICD-10-CM

## 2019-01-22 DIAGNOSIS — E2839 Other primary ovarian failure: Secondary | ICD-10-CM | POA: Diagnosis not present

## 2019-01-22 DIAGNOSIS — Z23 Encounter for immunization: Secondary | ICD-10-CM | POA: Diagnosis not present

## 2019-01-22 NOTE — Patient Instructions (Addendum)
Prevnar-13 (pneumona) vaccine given today.   Consider getting shingles vaccine (shingrix) at local pharmacy.  Bring a copy of your living will and/or healthcare power of attorney to your next office visit.  Continue doing brain stimulating activities (puzzles, reading, adult coloring books, staying active) to keep memory sharp.   Bone density scan placed. [facility] will call you to schedule at your convenience.  Let us know if you have not heard about the mammogram results by end of this week.   Colonoscopy due 2022 per repot; follow-up with previous GI to verify date and specific recommendation for repeat exam.  Plan to follow-up with Dr. [name] as scheduled re: joint pain.  Refer to diet education regarding reducing sugar and other offenders that promote inflammation in the body.    Tabitha Adams , Thank you for taking time to come for your Medicare Wellness Visit. I appreciate your ongoing commitment to your health goals. Please review the following plan we discussed and let me know if I can assist you in the future.   These are the goals we discussed: Goals    . Patient Stated     Improve joint pain, reduce sugar intake. Continue staying active, fixing up house!       This is a list of the screening recommended for you and due dates:  Health Maintenance  Topic Date Due  . DEXA scan (bone density measurement)  02/15/2011  . Colon Cancer Screening  11/13/2021*  . Pneumonia vaccines (2 of 2 - PPSV23) 01/22/2020  . Mammogram  01/18/2021  . Tetanus Vaccine  11/15/2025  . Flu Shot  Completed  .  Hepatitis C: One time screening is recommended by Center for Disease Control  (CDC) for  adults born from [birth year] through 1965.   Completed  *Topic was postponed. The date shown is not the original due date.      Zoster Vaccine, Recombinant injection What is this medicine? ZOSTER VACCINE (ZOS ter vak SEEN) is used to prevent shingles in adults 73 years old and over. This  vaccine is not used to treat shingles or nerve pain from shingles. This medicine may be used for other purposes; ask your health care provider or pharmacist if you have questions. COMMON BRAND NAME(S): [facility] What should I tell my health care provider before I take this medicine? They need to know if you have any of these conditions: -blood disorders or disease -cancer like leukemia or lymphoma -immune system problems or therapy -an unusual or allergic reaction to vaccines, other medications, foods, dyes, or preservatives -pregnant or trying to get pregnant -breast-feeding How should I use this medicine? This vaccine is for injection in a muscle. It is given by a health care professional. Talk to your pediatrician regarding the use of this medicine in children. This medicine is not approved for use in children. Overdosage: If you think you have taken too much of this medicine contact a poison control center or emergency room at once. NOTE: This medicine is only for you. Do not share this medicine with others. What if I miss a dose? Keep appointments for follow-up (booster) doses as directed. It is important not to miss your dose. Call your doctor or health care professional if you are unable to keep an appointment. What may interact with this medicine? -medicines that suppress your immune system -medicines to treat cancer -steroid medicines like prednisone or cortisone This list may not describe all possible interactions. Give your health care provider a list of  all the medicines, herbs, non-prescription drugs, or dietary supplements you use. Also tell them if you smoke, drink alcohol, or use illegal drugs. Some items may interact with your medicine. What should I watch for while using this medicine? Visit your doctor for regular check ups. This vaccine, like all vaccines, may not fully protect everyone. What side effects may I notice from receiving this medicine? Side effects that you  should report to your doctor or health care professional as soon as possible: -allergic reactions like skin rash, itching or hives, swelling of the face, lips, or tongue -breathing problems Side effects that usually do not require medical attention (report these to your doctor or health care professional if they continue or are bothersome): -chills -headache -fever -nausea, vomiting -redness, warmth, pain, swelling or itching at site where injected -tiredness This list may not describe all possible side effects. Call your doctor for medical advice about side effects. You may report side effects to FDA at 1-800-FDA-1088. Where should I keep my medicine? This vaccine is only given in a clinic, pharmacy, doctor's office, or other health care setting and will not be stored at home. NOTE: This sheet is a summary. It may not cover all possible information. If you have questions about this medicine, talk to your doctor, pharmacist, or health care provider.  2019 Elsevier/Gold Standard (2017-06-13 13:20:30)  Bone Density Test The bone density test uses a special type of X-ray to measure the amount of calcium and other minerals in your bones. It can measure bone density in the hip and the spine. The test procedure is similar to having a regular X-ray. This test may also be called:  Bone densitometry.  Bone mineral density test.  Dual-energy X-ray absorptiometry (DEXA). You may have this test to:  Diagnose a condition that causes weak or thin bones (osteoporosis).  Screen you for osteoporosis.  Predict your risk for a broken bone (fracture).  Determine how well your osteoporosis treatment is working. Tell a health care provider about:  Any allergies you have.  All medicines you are taking, including vitamins, herbs, eye drops, creams, and over-the-counter medicines.  Any problems you or family members have had with anesthetic medicines.  Any blood disorders you have.  Any surgeries  you have had.  Any medical conditions you have.  Whether you are pregnant or may be pregnant.  Any medical tests you have had within the past 14 days that used contrast material. What are the risks? Generally, this is a safe procedure. However, it does expose you to a small amount of radiation, which can slightly increase your cancer risk. What happens before the procedure?  Do not take any calcium supplements starting 24 hours before your test.  Remove all metal jewelry, eyeglasses, dental appliances, and any other metal objects. What happens during the procedure?   You will lie down on an exam table. There will be an X-ray generator below you and an imaging device above you.  Other devices, such as boxes or braces, may be used to position your body properly for the scan.  The machine will slowly scan your body. You will need to keep still.  The images will show up on a screen in the room. Images will be examined by a specialist after your test is done. The procedure may vary among health care providers and hospitals. What happens after the procedure?  It is up to you to get your test results. Ask your health care provider, or the department that  is doing the test, when your results will be ready. Summary  A bone density test is an imaging test that uses a type of X-ray to measure the amount of calcium and other minerals in your bones.  The test may be used to diagnose or screen you for a condition that causes weak or thin bones (osteoporosis), predict your risk for a broken bone (fracture), or determine how well your osteoporosis treatment is working.  Do not take any calcium supplements starting 24 hours before your test.  Ask your health care provider, or the department that is doing the test, when your results will be ready. This information is not intended to replace advice given to you by your health care provider. Make sure you discuss any questions you have with your  health care provider. Document Released: 11/23/2004 Document Revised: 09/05/2017 Document Reviewed: 09/05/2017 Elsevier Interactive Patient Education  2019 [place] Prevention in the Home, Adult Falls can cause injuries. They can happen to people of all ages. There are many things you can do to make your home safe and to help prevent falls. Ask for help when making these changes, if needed. What actions can I take to prevent falls? General Instructions  Use good lighting in all rooms. Replace any light bulbs that burn out.  Turn on the lights when you go into a dark area. Use night-lights.  Keep items that you use often in easy-to-reach places. Lower the shelves around your home if necessary.  Set up your furniture so you have a clear path. Avoid moving your furniture around.  Do not have throw rugs and other things on the floor that can make you trip.  Avoid walking on wet floors.  If any of your floors are uneven, fix them.  Add color or contrast paint or tape to clearly mark and help you see: ? Any grab bars or handrails. ? First and last steps of stairways. ? Where the edge of each step is.  If you use a stepladder: ? Make sure that it is fully opened. Do not climb a closed stepladder. ? Make sure that both sides of the stepladder are locked into place. ? Ask someone to hold the stepladder for you while you use it.  If there are any pets around you, be aware of where they are. What can I do in the bathroom?      Keep the floor dry. Clean up any water that spills onto the floor as soon as it happens.  Remove soap buildup in the tub or shower regularly.  Use non-skid mats or decals on the floor of the tub or shower.  Attach bath mats securely with double-sided, non-slip rug tape.  If you need to sit down in the shower, use a plastic, non-slip stool.  Install grab bars by the toilet and in the tub and shower. Do not use towel bars as grab bars. What  can I do in the bedroom?  Make sure that you have a light by your bed that is easy to reach.  Do not use any sheets or blankets that are too big for your bed. They should not hang down onto the floor.  Have a firm chair that has side arms. You can use this for support while you get dressed. What can I do in the kitchen?  Clean up any spills right away.  If you need to reach something above you, use a strong step stool that has a  grab bar.  Keep electrical cords out of the way.  Do not use floor polish or wax that makes floors slippery. If you must use wax, use non-skid floor wax. What can I do with my stairs?  Do not leave any items on the stairs.  Make sure that you have a light switch at the top of the stairs and the bottom of the stairs. If you do not have them, ask someone to add them for you.  Make sure that there are handrails on both sides of the stairs, and use them. Fix handrails that are broken or loose. Make sure that handrails are as long as the stairways.  Install non-slip stair treads on all stairs in your home.  Avoid having throw rugs at the top or bottom of the stairs. If you do have throw rugs, attach them to the floor with carpet tape.  Choose a carpet that does not hide the edge of the steps on the stairway.  Check any carpeting to make sure that it is firmly attached to the stairs. Fix any carpet that is loose or worn. What can I do on the outside of my home?  Use bright outdoor lighting.  Regularly fix the edges of walkways and driveways and fix any cracks.  Remove anything that might make you trip as you walk through a door, such as a raised step or threshold.  Trim any bushes or trees on the path to your home.  Regularly check to see if handrails are loose or broken. Make sure that both sides of any steps have handrails.  Install guardrails along the edges of any raised decks and porches.  Clear walking paths of anything that might make someone  trip, such as tools or rocks.  Have any leaves, snow, or ice cleared regularly.  Use sand or salt on walking paths during winter.  Clean up any spills in your garage right away. This includes grease or oil spills. What other actions can I take?  Wear shoes that: ? Have a low heel. Do not wear high heels. ? Have rubber bottoms. ? Are comfortable and fit you well. ? Are closed at the toe. Do not wear open-toe sandals.  Use tools that help you move around (mobility aids) if they are needed. These include: ? Canes. ? Walkers. ? Scooters. ? Crutches.  Review your medicines with your doctor. Some medicines can make you feel dizzy. This can increase your chance of falling. Ask your doctor what other things you can do to help prevent falls. Where to find more information  Centers for Disease Control and Prevention, STEADI: https://garcia.biz/  [employer] on Aging: BrainJudge.co.uk Contact a doctor if:  You are afraid of falling at home.  You feel weak, drowsy, or dizzy at home.  You fall at home. Summary  There are many simple things that you can do to make your home safe and to help prevent falls.  Ways to make your home safe include removing tripping hazards and installing grab bars in the bathroom.  Ask for help when making these changes in your home. This information is not intended to replace advice given to you by your health care provider. Make sure you discuss any questions you have with your health care provider. Document Released: 08/28/2009 Document Revised: 06/16/2017 Document Reviewed: 06/16/2017 Elsevier Interactive Patient Education  2019 [place] Maintenance, Female Adopting a healthy lifestyle and getting preventive care can go a long way to promote health and  wellness. Talk with your health care provider about what schedule of regular examinations is right for you. This is a good chance for you to check in with your provider about  disease prevention and staying healthy. In between checkups, there are plenty of things you can do on your own. Experts have done a lot of research about which lifestyle changes and preventive measures are most likely to keep you healthy. Ask your health care provider for more information. Weight and diet Eat a healthy diet  Be sure to include plenty of vegetables, fruits, low-fat dairy products, and lean protein.  Do not eat a lot of foods high in solid fats, added sugars, or salt.  Get regular exercise. This is one of the most important things you can do for your health. ? Most adults should exercise for at least 150 minutes each week. The exercise should increase your heart rate and make you sweat (moderate-intensity exercise). ? Most adults should also do strengthening exercises at least twice a week. This is in addition to the moderate-intensity exercise. Maintain a healthy weight  Body mass index (BMI) is a measurement that can be used to identify possible weight problems. It estimates body fat based on height and weight. Your health care provider can help determine your BMI and help you achieve or maintain a healthy weight.  For females 70 years of age and older: ? A BMI below 18.5 is considered underweight. ? A BMI of 18.5 to 24.9 is normal. ? A BMI of 25 to 29.9 is considered overweight. ? A BMI of 30 and above is considered obese. Watch levels of cholesterol and blood lipids  You should start having your blood tested for lipids and cholesterol at 73 years of age, then have this test every 5 years.  You may need to have your cholesterol levels checked more often if: ? Your lipid or cholesterol levels are high. ? You are older than 73 years of age. ? You are at high risk for heart disease. Cancer screening Lung Cancer  Lung cancer screening is recommended for adults 64-49 years old who are at high risk for lung cancer because of a history of smoking.  A yearly low-dose CT  scan of the lungs is recommended for people who: ? Currently smoke. ? Have quit within the past 15 years. ? Have at least a 30-pack-year history of smoking. A pack year is smoking an average of one pack of cigarettes a day for 1 year.  Yearly screening should continue until it has been 15 years since you quit.  Yearly screening should stop if you develop a health problem that would prevent you from having lung cancer treatment. Breast Cancer  Practice breast self-awareness. This means understanding how your breasts normally appear and feel.  It also means doing regular breast self-exams. Let your health care provider know about any changes, no matter how small.  If you are in your 20s or 30s, you should have a clinical breast exam (CBE) by a health care provider every 1-3 years as part of a regular health exam.  If you are 69 or older, have a CBE every year. Also consider having a breast X-ray (mammogram) every year.  If you have a family history of breast cancer, talk to your health care provider about genetic screening.  If you are at high risk for breast cancer, talk to your health care provider about having an MRI and a mammogram every year.  Breast cancer gene (BRCA)  assessment is recommended for women who have family members with BRCA-related cancers. BRCA-related cancers include: ? Breast. ? Ovarian. ? Tubal. ? Peritoneal cancers.  Results of the assessment will determine the need for genetic counseling and BRCA1 and BRCA2 testing. Cervical Cancer Your health care provider may recommend that you be screened regularly for cancer of the pelvic organs (ovaries, uterus, and vagina). This screening involves a pelvic examination, including checking for microscopic changes to the surface of your cervix (Pap test). You may be encouraged to have this screening done every 3 years, beginning at age 45.  For women ages 53-65, health care providers may recommend pelvic exams and Pap testing  every 3 years, or they may recommend the Pap and pelvic exam, combined with testing for human papilloma virus (HPV), every 5 years. Some types of HPV increase your risk of cervical cancer. Testing for HPV may also be done on women of any age with unclear Pap test results.  Other health care providers may not recommend any screening for nonpregnant women who are considered low risk for pelvic cancer and who do not have symptoms. Ask your health care provider if a screening pelvic exam is right for you.  If you have had past treatment for cervical cancer or a condition that could lead to cancer, you need Pap tests and screening for cancer for at least 20 years after your treatment. If Pap tests have been discontinued, your risk factors (such as having a new sexual partner) need to be reassessed to determine if screening should resume. Some women have medical problems that increase the chance of getting cervical cancer. In these cases, your health care provider may recommend more frequent screening and Pap tests. Colorectal Cancer  This type of cancer can be detected and often prevented.  Routine colorectal cancer screening usually begins at 73 years of age and continues through 73 years of age.  Your health care provider may recommend screening at an earlier age if you have risk factors for colon cancer.  Your health care provider may also recommend using home test kits to check for hidden blood in the stool.  A small camera at the end of a tube can be used to examine your colon directly (sigmoidoscopy or colonoscopy). This is done to check for the earliest forms of colorectal cancer.  Routine screening usually begins at age 28.  Direct examination of the colon should be repeated every 5-10 years through 73 years of age. However, you may need to be screened more often if early forms of precancerous polyps or small growths are found. Skin Cancer  Check your skin from head to toe regularly.  Tell  your health care provider about any new moles or changes in moles, especially if there is a change in a mole's shape or color.  Also tell your health care provider if you have a mole that is larger than the size of a pencil eraser.  Always use sunscreen. Apply sunscreen liberally and repeatedly throughout the day.  Protect yourself by wearing long sleeves, pants, a wide-brimmed hat, and sunglasses whenever you are outside. Heart disease, diabetes, and high blood pressure  High blood pressure causes heart disease and increases the risk of stroke. High blood pressure is more likely to develop in: ? People who have blood pressure in the high end of the normal range (130-139/85-89 mm Hg). ? People who are overweight or obese. ? People who are African American.  If you are 35-39 years of age,  have your blood pressure checked every 3-5 years. If you are 74 years of age or older, have your blood pressure checked every year. You should have your blood pressure measured twice-once when you are at a hospital or clinic, and once when you are not at a hospital or clinic. Record the average of the two measurements. To check your blood pressure when you are not at a hospital or clinic, you can use: ? An automated blood pressure machine at a pharmacy. ? A home blood pressure monitor.  If you are between 90 years and 28 years old, ask your health care provider if you should take aspirin to prevent strokes.  Have regular diabetes screenings. This involves taking a blood sample to check your fasting blood sugar level. ? If you are at a normal weight and have a low risk for diabetes, have this test once every three years after 73 years of age. ? If you are overweight and have a high risk for diabetes, consider being tested at a younger age or more often. Preventing infection Hepatitis B  If you have a higher risk for hepatitis B, you should be screened for this virus. You are considered at high risk for  hepatitis B if: ? You were born in a country where hepatitis B is common. Ask your health care provider which countries are considered high risk. ? Your parents were born in a high-risk country, and you have not been immunized against hepatitis B (hepatitis B vaccine). ? You have HIV or AIDS. ? You use needles to inject street drugs. ? You live with someone who has hepatitis B. ? You have had sex with someone who has hepatitis B. ? You get hemodialysis treatment. ? You take certain medicines for conditions, including cancer, organ transplantation, and autoimmune conditions. Hepatitis C  Blood testing is recommended for: ? Everyone born from [birth year] through 1965. ? Anyone with known risk factors for hepatitis C. Sexually transmitted infections (STIs)  You should be screened for sexually transmitted infections (STIs) including gonorrhea and chlamydia if: ? You are sexually active and are younger than 73 years of age. ? You are older than 73 years of age and your health care provider tells you that you are at risk for this type of infection. ? Your sexual activity has changed since you were last screened and you are at an increased risk for chlamydia or gonorrhea. Ask your health care provider if you are at risk.  If you do not have HIV, but are at risk, it may be recommended that you take a prescription medicine daily to prevent HIV infection. This is called pre-exposure prophylaxis (PrEP). You are considered at risk if: ? You are sexually active and do not regularly use condoms or know the HIV status of your partner(s). ? You take drugs by injection. ? You are sexually active with a partner who has HIV. Talk with your health care provider about whether you are at high risk of being infected with HIV. If you choose to begin PrEP, you should first be tested for HIV. You should then be tested every 3 months for as long as you are taking PrEP. Pregnancy  If you are premenopausal and you may  become pregnant, ask your health care provider about preconception counseling.  If you may become pregnant, take 400 to 800 micrograms (mcg) of folic acid every day.  If you want to prevent pregnancy, talk to your health care provider about birth control (contraception). Osteoporosis  and menopause  Osteoporosis is a disease in which the bones lose minerals and strength with aging. This can result in serious bone fractures. Your risk for osteoporosis can be identified using a bone density scan.  If you are 63 years of age or older, or if you are at risk for osteoporosis and fractures, ask your health care provider if you should be screened.  Ask your health care provider whether you should take a calcium or vitamin D supplement to lower your risk for osteoporosis.  Menopause may have certain physical symptoms and risks.  Hormone replacement therapy may reduce some of these symptoms and risks. Talk to your health care provider about whether hormone replacement therapy is right for you. Follow these instructions at home:  Schedule regular health, dental, and eye exams.  Stay current with your immunizations.  Do not use any tobacco products including cigarettes, chewing tobacco, or electronic cigarettes.  If you are pregnant, do not drink alcohol.  If you are breastfeeding, limit how much and how often you drink alcohol.  Limit alcohol intake to no more than 1 drink per day for nonpregnant women. One drink equals 12 ounces of beer, 5 ounces of wine, or 1 ounces of hard liquor.  Do not use street drugs.  Do not share needles.  Ask your health care provider for help if you need support or information about quitting drugs.  Tell your health care provider if you often feel depressed.  Tell your health care provider if you have ever been abused or do not feel safe at home. This information is not intended to replace advice given to you by your health care provider. Make sure you  discuss any questions you have with your health care provider. Document Released: 05/17/2011 Document Revised: 04/08/2016 Document Reviewed: 08/05/2015 Elsevier Interactive Patient Education  2019 [employer].

## 2019-01-25 ENCOUNTER — Other Ambulatory Visit: Payer: Self-pay | Admitting: Family Medicine

## 2019-01-25 DIAGNOSIS — E785 Hyperlipidemia, unspecified: Secondary | ICD-10-CM

## 2019-01-25 DIAGNOSIS — K219 Gastro-esophageal reflux disease without esophagitis: Secondary | ICD-10-CM

## 2019-01-25 DIAGNOSIS — J309 Allergic rhinitis, unspecified: Secondary | ICD-10-CM

## 2019-01-30 ENCOUNTER — Encounter: Payer: Self-pay | Admitting: *Deleted

## 2019-01-30 ENCOUNTER — Encounter: Payer: Self-pay | Admitting: Family Medicine

## 2019-01-30 ENCOUNTER — Ambulatory Visit (INDEPENDENT_AMBULATORY_CARE_PROVIDER_SITE_OTHER): Payer: Medicare Other | Admitting: Family Medicine

## 2019-01-30 ENCOUNTER — Other Ambulatory Visit: Payer: Self-pay

## 2019-01-30 VITALS — BP 120/80 | HR 88 | Temp 98.2°F | Resp 12 | Ht 59.0 in | Wt 144.1 lb

## 2019-01-30 DIAGNOSIS — I10 Essential (primary) hypertension: Secondary | ICD-10-CM | POA: Diagnosis not present

## 2019-01-30 DIAGNOSIS — M159 Polyosteoarthritis, unspecified: Secondary | ICD-10-CM | POA: Diagnosis not present

## 2019-01-30 DIAGNOSIS — M2550 Pain in unspecified joint: Secondary | ICD-10-CM | POA: Diagnosis not present

## 2019-01-30 DIAGNOSIS — G894 Chronic pain syndrome: Secondary | ICD-10-CM | POA: Diagnosis not present

## 2019-01-30 DIAGNOSIS — F329 Major depressive disorder, single episode, unspecified: Secondary | ICD-10-CM | POA: Diagnosis not present

## 2019-01-30 NOTE — Patient Instructions (Signed)
A few things to remember from today's visit:   Chronic pain disorder  Generalized osteoarthritis of multiple sites  Essential hypertension, benign - Plan: Basic metabolic panel  Major depressive disorder with single episode, remission status unspecified  Continue weaning off Celexa. No changes in regular medications. We can plan on checking your cholesterol next visit.  Please be sure medication list is accurate. If a new problem present, please set up appointment sooner than planned today.

## 2019-01-30 NOTE — Progress Notes (Signed)
HPI:   Tabitha Adams is a 73 y.o. female, who is here today for chronic disease management.  She was last seen on 07/24/2018. No new problems since her last visit.  HTN: Currently she is on losartan 50 mg daily. She is not checking BP at home. Denies severe/frequent headache, visual changes, chest pain, dyspnea, palpitation,focal weakness, or edema.   Lab Results  Component Value Date   CREATININE 0.79 07/24/2018   BUN 14 07/24/2018   NA 134 (L) 07/24/2018   K 4.3 07/24/2018   CL 99 07/24/2018   CO2 28 07/24/2018   She is taking OTC potassium supplementation, which helps with leg cramps.   Generalized OA: Problem affects IP of hands and toes, lower back, and knees.  She feels like hands joint pain is slowly getting worse. She has a new job, Scientist, water quality at [employer]. She is concerned about worsening deformities on fingers, mainly DIP middle finger bilateral.  She wonders if there is something to prevent progression. Problem does not interfere with daily living activities, no significant limitation of range of motion. Pain is worse in the morning when she first gets up, alleviated by passive movement. + Stiffness after prolonged rest. Negative for edema or erythema.  When she takes Tramadol pain is mild,2/10. She is on tramadol 50 mg, which she takes mainly at night. Also taking Mobic 15 mg daily as needed. She is tolerating medication well, denies side effects.  Depression: She has been on Celexa for about a year. Symptoms have improved, she denies suicidal thoughts. For the past few months she has decreased dose of Celexa, now she is taking half tablet and doing well so far.  She is still living alone but her children call her almost daily. Working part time.   Review of Systems  Constitutional: Negative for activity change, appetite change, fatigue and fever.  HENT: Negative for mouth sores, nosebleeds and trouble swallowing.   Eyes: Negative for  redness and visual disturbance.  Respiratory: Negative for cough, shortness of breath and wheezing.   Cardiovascular: Positive for leg swelling. Negative for chest pain and palpitations.  Gastrointestinal: Negative for abdominal pain, nausea and vomiting.       Negative for changes in bowel habits.  Genitourinary: Negative for decreased urine volume and hematuria.  Musculoskeletal: Positive for arthralgias. Negative for gait problem.  Neurological: Negative for syncope, weakness and headaches.  Psychiatric/Behavioral: Negative for confusion and suicidal ideas.    Current Outpatient Medications on File Prior to Visit  Medication Sig Dispense Refill  . b complex vitamins capsule Take 1 capsule by mouth daily.    . Calcium-Phosphorus-Vitamin D (CALCIUM GUMMIES) [phone] MG-MG-UNIT CHEW Chew by mouth.    . cetirizine (ZYRTEC) 10 MG tablet Take 10 mg by mouth at bedtime.    . Cholecalciferol (VITAMIN D3) 50 MCG (2000 UT) TABS Take by mouth daily.    . citalopram (CELEXA) 10 MG tablet TAKE 1 TABLET(10 MG) BY MOUTH DAILY (Patient taking differently: Takes 1/2 tablet (5mg ) daily) 90 tablet 2  . ferrous sulfate 325 (65 FE) MG tablet Take 325 mg by mouth every Monday, Wednesday, and Friday.    . fluticasone (FLONASE) 50 MCG/ACT nasal spray Place 1 spray into both nostrils 2 (two) times daily as needed for allergies or rhinitis. 16 g 11  . glucosamine-chondroitin 500-400 MG tablet Take 1 tablet by mouth 2 (two) times daily.    Marland Kitchen losartan (COZAAR) 50 MG tablet TAKE 1 TABLET BY MOUTH  DAILY 90 tablet 2  . meclizine (ANTIVERT) 25 MG tablet Take 1 tablet (25 mg total) by mouth 3 (three) times daily as needed for dizziness. 45 tablet 1  . meloxicam (MOBIC) 15 MG tablet TAKE 1 TABLET BY MOUTH  DAILY 90 tablet 1  . Misc Natural Products (SUPER ENERGY HERBAL COMPLEX PO) Take by mouth.    . montelukast (SINGULAIR) 10 MG tablet TAKE 1 TABLET BY MOUTH AT  BEDTIME 90 tablet 1  . Multiple Vitamin (MULTIVITAMIN  WITH MINERALS) TABS tablet Take 1 tablet by mouth daily.    Marland Kitchen omeprazole (PRILOSEC) 20 MG capsule TAKE 1 CAPSULE BY MOUTH  DAILY 90 capsule 1  . Potassium Gluconate 595 MG CAPS Take by mouth.    . pravastatin (PRAVACHOL) 20 MG tablet TAKE 1 TABLET BY MOUTH  DAILY 90 tablet 1  . vitamin C (VITAMIN C) 1000 MG tablet Take 1 tablet (1,000 mg total) by mouth daily. 30 tablet 0   No current facility-administered medications on file prior to visit.      Past Medical History:  Diagnosis Date  . Allergy   . Anemia   . Arthritis   . GERD (gastroesophageal reflux disease)   . Hyperlipidemia    Allergies  Allergen Reactions  . Sulfur Swelling and Other (See Comments)    Reaction:  All over body swelling   . Penicillins Rash and Other (See Comments)    Has patient had a PCN reaction causing immediate rash, facial/tongue/throat swelling, SOB or lightheadedness with hypotension: No Has patient had a PCN reaction causing severe rash involving mucus membranes or skin necrosis: No Has patient had a PCN reaction that required hospitalization No Has patient had a PCN reaction occurring within the last 10 years: No If all of the above answers are "NO", then may proceed with Cephalosporin use.    Social History   Socioeconomic History  . Marital status: Widowed    Spouse name: Not on file  . Number of children: 3  . Years of education: Not on file  . Highest education level: Not on file  Occupational History  . Occupation: Surveyor, quantity: [employer]    Comment: works 25-30hrs/week  Social Needs  . Financial resource strain: Not hard at all  . Food insecurity:    Worry: Never true    Inability: Never true  . Transportation needs:    Medical: No    Non-medical: No  Tobacco Use  . Smoking status: Former Research scientist (life sciences)  . Smokeless tobacco: Never Used  Substance and Sexual Activity  . Alcohol use: No  . Drug use: No  . Sexual activity: Not Currently  Lifestyle  . Physical activity:     Days per week: 0 days    Minutes per session: 0 min  . Stress: Not at all  Relationships  . Social connections:    Talks on phone: Three times a week    Gets together: Twice a week    Attends religious service: More than 4 times per year    Active member of club or organization: Yes    Attends meetings of clubs or organizations: More than 4 times per year    Relationship status: Widowed  Other Topics Concern  . Not on file  Social History Narrative   01/22/2019:   Widowed since 10/2017; lives alone now in [place] house with 2 dogs   Has one son, and one stepson and Psychiatrist. Sons live close by, supportive.   Has  5 grandchildren and several great grandchildren, active in their lives   Continues to work PT as Scientist, water quality, likes to stay busy   [employer] community support system    Vitals:   01/30/19 1520  BP: 120/80  Pulse: 88  Resp: 12  Temp: 98.2 F (36.8 C)  SpO2: 93%   Body mass index is 29.11 kg/m.   Physical Exam  Nursing note and vitals reviewed. Constitutional: She is oriented to person, place, and time. She appears well-developed. No distress.  HENT:  Head: Normocephalic and atraumatic.  Mouth/Throat: Oropharynx is clear and moist and mucous membranes are normal.  Eyes: Pupils are equal, round, and reactive to light. Conjunctivae are normal.  Cardiovascular: Normal rate and regular rhythm.  No murmur heard. Pulses:      Dorsalis pedis pulses are 2+ on the right side and 2+ on the left side.  Respiratory: Effort normal and breath sounds normal. No respiratory distress.  GI: Soft. She exhibits no mass. There is no hepatomegaly. There is no abdominal tenderness.  Musculoskeletal:        General: Edema (1+ pitting LE edema,bilateral.) present.     Right shoulder: She exhibits normal range of motion and no tenderness.     Left shoulder: She exhibits normal range of motion and no tenderness.     Comments: Heberden's node and some with Bouchard's nodes. No significant  limitation of range of motion. No signs of synovitis.  Lymphadenopathy:    She has no cervical adenopathy.  Neurological: She is alert and oriented to person, place, and time. She has normal strength. No cranial nerve deficit. Gait normal.  Skin: Skin is warm. No rash noted. No erythema.  Psychiatric: She has a normal mood and affect.  Well groomed, good eye contact.    ASSESSMENT AND PLAN:   Tabitha Adams was seen today for chronic disease management.  Orders Placed This Encounter  Procedures  . Basic metabolic panel  . C-reactive protein  . Sedimentation rate  . Cyclic citrul peptide antibody, IgG  . Rheumatoid factor    1. Chronic pain disorder Overall pain seems to be well controlled with current management.  We discussed side effects of both medications. Med contract signed today. Follow-up in 6 months, before if needed.  - traMADol (ULTRAM) 50 MG tablet; Take 1 tablet (50 mg total) by mouth at bedtime.  Dispense: 30 tablet; Refill: 3  2. Generalized osteoarthritis of multiple sites We discussed diagnosis, treatment options, and prognosis. In general pain seems to be well controlled with Mobic and tramadol. Pain could be getting worse due to new job, Scientist, water quality. Follow-up in 6 months.  - traMADol (ULTRAM) 50 MG tablet; Take 1 tablet (50 mg total) by mouth at bedtime.  Dispense: 30 tablet; Refill: 3  3. Essential hypertension, benign BP adequately controlled. No changes in current management. Recommend monitoring BP periodically. Continue low-salt diet and annual eye exam. Follow-up in 6 months.  - Basic metabolic panel  4. Major depressive disorder with single episode, remission status unspecified Improved. Continue weaning off Celexa as tolerated. Instructed about warning signs. Follow-up in 6 months.  5. Polyarthralgia We discussed other possible etiologies of arthralgias.  History and findings on examination suggest OA. Further recommendation  will be given according to lab results.  - C-reactive protein - Sedimentation rate - Cyclic citrul peptide antibody, IgG - Rheumatoid factor    Return in about 6 months (around 08/02/2019) for f/u.     Dredyn Gubbels G. [name], MD  [place]. [place] office.

## 2019-01-31 ENCOUNTER — Other Ambulatory Visit: Payer: Medicare Other

## 2019-01-31 ENCOUNTER — Encounter: Payer: Self-pay | Admitting: Family Medicine

## 2019-01-31 LAB — BASIC METABOLIC PANEL
BUN: 16 mg/dL (ref 6–23)
CO2: 29 mEq/L (ref 19–32)
Calcium: 9.7 mg/dL (ref 8.4–10.5)
Chloride: 101 mEq/L (ref 96–112)
Creatinine, Ser: 0.83 mg/dL (ref 0.40–1.20)
GFR: 67.39 mL/min (ref >60.00)
Glucose, Bld: 100 mg/dL — ABNORMAL HIGH (ref 70–99)
Potassium: 4 mEq/L (ref 3.5–5.1)
Sodium: 137 mEq/L (ref 135–145)

## 2019-01-31 LAB — CYCLIC CITRUL PEPTIDE ANTIBODY, IGG: Cyclic Citrullin Peptide Ab: <16 UNITS

## 2019-01-31 LAB — RHEUMATOID FACTOR: Rheumatoid fact SerPl-aCnc: <14 IU/mL (ref <14)

## 2019-01-31 LAB — C-REACTIVE PROTEIN

## 2019-01-31 LAB — SEDIMENTATION RATE: Sed Rate: 5 mm/hr (ref 0–30)

## 2019-01-31 MED ORDER — TRAMADOL HCL 50 MG PO TABS: 50.0000 mg | ORAL_TABLET | Freq: Every day | ORAL | 3 refills | Status: DC

## 2019-02-19 ENCOUNTER — Other Ambulatory Visit: Payer: Self-pay | Admitting: Family Medicine

## 2019-02-19 DIAGNOSIS — G894 Chronic pain syndrome: Secondary | ICD-10-CM

## 2019-02-19 DIAGNOSIS — M159 Polyosteoarthritis, unspecified: Secondary | ICD-10-CM

## 2019-02-19 NOTE — Telephone Encounter (Signed)
Last visit (01/30/19) tramadol refill was sent to her pharmacy with 3 refills. She should have filled Tramadol 02/13/19 and has 3 more refills available. Thanks, BJ

## 2019-02-20 NOTE — Telephone Encounter (Signed)
Spoke with [facility] patient has picked up medication from pharmacy about an hour ago. Nothing further needed at this time.

## 2019-04-12 ENCOUNTER — Other Ambulatory Visit: Payer: Medicare Other

## 2019-05-07 ENCOUNTER — Other Ambulatory Visit: Payer: Self-pay | Admitting: Family Medicine

## 2019-06-05 ENCOUNTER — Other Ambulatory Visit: Payer: Self-pay

## 2019-06-05 ENCOUNTER — Ambulatory Visit
Admission: RE | Admit: 2019-06-05 | Discharge: 2019-06-05 | Disposition: A | Discharge status: Home / Self Care | Payer: Medicare Other | Source: Ambulatory Visit | Attending: Family Medicine | Admitting: Family Medicine

## 2019-06-05 DIAGNOSIS — Z780 Asymptomatic menopausal state: Secondary | ICD-10-CM | POA: Diagnosis not present

## 2019-06-05 DIAGNOSIS — M8589 Other specified disorders of bone density and structure, multiple sites: Secondary | ICD-10-CM | POA: Diagnosis not present

## 2019-06-05 DIAGNOSIS — E2839 Other primary ovarian failure: Secondary | ICD-10-CM

## 2019-06-15 ENCOUNTER — Other Ambulatory Visit: Payer: Self-pay | Admitting: Family Medicine

## 2019-06-15 DIAGNOSIS — M159 Polyosteoarthritis, unspecified: Secondary | ICD-10-CM

## 2019-06-15 DIAGNOSIS — G894 Chronic pain syndrome: Secondary | ICD-10-CM

## 2019-06-17 ENCOUNTER — Encounter: Payer: Self-pay | Admitting: Family Medicine

## 2019-07-03 ENCOUNTER — Other Ambulatory Visit: Payer: Self-pay | Admitting: Family Medicine

## 2019-07-03 DIAGNOSIS — F320 Major depressive disorder, single episode, mild: Secondary | ICD-10-CM

## 2019-07-27 ENCOUNTER — Other Ambulatory Visit: Payer: Self-pay | Admitting: Family Medicine

## 2019-07-27 DIAGNOSIS — J309 Allergic rhinitis, unspecified: Secondary | ICD-10-CM

## 2019-07-27 DIAGNOSIS — E785 Hyperlipidemia, unspecified: Secondary | ICD-10-CM

## 2019-07-27 DIAGNOSIS — I10 Essential (primary) hypertension: Secondary | ICD-10-CM

## 2019-07-27 DIAGNOSIS — K219 Gastro-esophageal reflux disease without esophagitis: Secondary | ICD-10-CM

## 2019-08-06 ENCOUNTER — Other Ambulatory Visit: Payer: Self-pay

## 2019-08-06 ENCOUNTER — Encounter: Payer: Self-pay | Admitting: Family Medicine

## 2019-08-06 ENCOUNTER — Ambulatory Visit (INDEPENDENT_AMBULATORY_CARE_PROVIDER_SITE_OTHER): Payer: Medicare Other | Admitting: Family Medicine

## 2019-08-06 VITALS — BP 120/74 | HR 76 | Temp 97.6°F | Resp 12 | Ht 59.0 in | Wt 144.1 lb

## 2019-08-06 DIAGNOSIS — I10 Essential (primary) hypertension: Secondary | ICD-10-CM

## 2019-08-06 DIAGNOSIS — E785 Hyperlipidemia, unspecified: Secondary | ICD-10-CM | POA: Diagnosis not present

## 2019-08-06 DIAGNOSIS — M159 Polyosteoarthritis, unspecified: Secondary | ICD-10-CM | POA: Diagnosis not present

## 2019-08-06 DIAGNOSIS — F329 Major depressive disorder, single episode, unspecified: Secondary | ICD-10-CM | POA: Diagnosis not present

## 2019-08-06 DIAGNOSIS — G894 Chronic pain syndrome: Secondary | ICD-10-CM

## 2019-08-06 DIAGNOSIS — Z23 Encounter for immunization: Secondary | ICD-10-CM

## 2019-08-06 DIAGNOSIS — M85852 Other specified disorders of bone density and structure, left thigh: Secondary | ICD-10-CM

## 2019-08-06 DIAGNOSIS — H8110 Benign paroxysmal vertigo, unspecified ear: Secondary | ICD-10-CM | POA: Diagnosis not present

## 2019-08-06 LAB — COMPREHENSIVE METABOLIC PANEL
ALT: 19 U/L (ref 0–35)
AST: 23 U/L (ref 0–37)
Albumin: 4.5 g/dL (ref 3.5–5.2)
Alkaline Phosphatase: 78 U/L (ref 39–117)
BUN: 17 mg/dL (ref 6–23)
CO2: 29 mEq/L (ref 19–32)
Calcium: 10 mg/dL (ref 8.4–10.5)
Chloride: 98 mEq/L (ref 96–112)
Creatinine, Ser: 0.82 mg/dL (ref 0.40–1.20)
GFR: 68.25 mL/min (ref >60.00)
Glucose, Bld: 85 mg/dL (ref 70–99)
Potassium: 4.5 mEq/L (ref 3.5–5.1)
Sodium: 134 mEq/L — ABNORMAL LOW (ref 135–145)
Total Bilirubin: 0.4 mg/dL (ref 0.2–1.2)
Total Protein: 6.9 g/dL (ref 6.0–8.3)

## 2019-08-06 LAB — LIPID PANEL
Cholesterol: 164 mg/dL (ref 0–200)
HDL: 44 mg/dL (ref >39.00)
LDL Cholesterol: 97 mg/dL (ref 0–99)
NonHDL: 120.25
Total CHOL/HDL Ratio: 4
Triglycerides: 114 mg/dL (ref 0.0–149.0)
VLDL: 22.8 mg/dL (ref 0.0–40.0)

## 2019-08-06 MED ORDER — ALENDRONATE SODIUM 70 MG PO TABS: 70.0000 mg | ORAL_TABLET | ORAL | 2 refills | Status: DC

## 2019-08-06 MED ORDER — MECLIZINE HCL 25 MG PO TABS: 12.5000 mg | ORAL_TABLET | Freq: Two times a day (BID) | ORAL | 1 refills | Status: DC | PRN

## 2019-08-06 NOTE — Progress Notes (Addendum)
HPI:   [name] is a 73 y.o. female, who is here today for chronic disease management. She was last seen on 01/30/2019. Last visit she reported that depression was much better, so we planned on weaning Celexa off.  Resumed Celexa 5 mg because she was feeling mildly depressed. She denies suicidal thoughts. Medication is helping with mood.  She is still living alone, working part-time (about 32 hours/week).  Depression screen [facility] 2/9 08/06/2019 01/22/2019  Decreased Interest 2 0  Down, Depressed, Hopeless 0 1  PHQ - 2 Score 2 1  Altered sleeping 0 0  Tired, decreased energy 1 1  Change in appetite 1 0  Feeling bad or failure about yourself  0 0  Trouble concentrating 1 0  Moving slowly or fidgety/restless 0 0  Suicidal thoughts 0 0  PHQ-9 Score 5 2  Difficult doing work/chores Somewhat difficult -    Hypertension: She is not checking BP at home. Currently she is on losartan 50 mg daily. Denies severe/frequent headache, visual changes, chest pain, dyspnea, palpitation, claudication, focal weakness, or edema.   Lab Results  Component Value Date   CREATININE 0.83 01/30/2019   BUN 16 01/30/2019   NA 137 01/30/2019   K 4.0 01/30/2019   CL 101 01/30/2019   CO2 29 01/30/2019   Lab Results  Component Value Date   HGBA1C 5.6 03/09/2017   Hyperlipidemia: She is following low-fat diet. Currently she is on pravastatin 20 mg daily. Tolerated medication well, no side effects reported.  Lab Results  Component Value Date   CHOL 190 02/01/2018   HDL 51.00 02/01/2018   LDLCALC 107 (H) 02/01/2018   TRIG 159.0 (H) 02/01/2018   CHOLHDL 4 02/01/2018   Chronic pain: generalized OA and back pain. Currently she is on tramadol 50 mg, taking medication at bedtime. IP joint pain getting worse, exacerbated by repetitive hand movements, mainly after work. It is also exacerbated by cold weather. + Stiffness. Intermittent mild edema on some joints, no erythema.  Sometime pain is "bad."  She denies side effects of tramadol. She is also taking meloxicam 50 mg daily.  She also would like to review DEXA report, done on 06/05/19. On 07/05/2019, DEXA reviewed and recommendations were given, sent message through my chart. Osteopenia with risk for major osteoporotic fracture 22.1% and hip fracture 5.7%.  She is taking Ca++ and Vit D supplementation.  Vertigo: Currently she is on meclizine 25 mg, she takes 1/2 tab in the morning as needed. Spinning-like sensation when laying in bed, doing floor exercises, or head movement. She has been evaluated by ENT. She was not able to do vestibular exercises at home, afraid of falling.  Negative for hearing changes.  Review of Systems  Constitutional: Negative for activity change, appetite change, fatigue, fever and unexpected weight change.  HENT: Negative for ear discharge, ear pain, mouth sores, nosebleeds and sore throat.   Eyes: Negative for redness and visual disturbance.  Respiratory: Negative for cough and wheezing.   Gastrointestinal: Negative for abdominal pain, nausea and vomiting.       Negative for changes in bowel habits.  Genitourinary: Negative for decreased urine volume and hematuria.  Skin: Negative for rash and wound.  Allergic/Immunologic: Positive for environmental allergies.  Neurological: Negative for syncope and facial asymmetry.  Psychiatric/Behavioral: Negative for confusion. The patient is nervous/anxious.   Rest see pertinent positives and negatives per HPI.   Current Outpatient Medications on File Prior to Visit  Medication Sig  Dispense Refill  . b complex vitamins capsule Take 1 capsule by mouth daily.    . Calcium-Phosphorus-Vitamin D (CALCIUM GUMMIES) [id number] MG-MG-UNIT CHEW Chew by mouth.    . cetirizine (ZYRTEC) 10 MG tablet Take 10 mg by mouth at bedtime.    . Cholecalciferol (VITAMIN D3) 50 MCG (2000 UT) TABS Take by mouth daily.    . citalopram (CELEXA) 10 MG tablet  Take 0.5 tablets (5 mg total) by mouth daily. Takes 1/2 tablet (5mg ) daily 45 tablet 2  . ferrous sulfate 325 (65 FE) MG tablet Take 325 mg by mouth every Monday, Wednesday, and Friday.    . fluticasone (FLONASE) 50 MCG/ACT nasal spray Place 1 spray into both nostrils 2 (two) times daily as needed for allergies or rhinitis. 16 g 11  . glucosamine-chondroitin 500-400 MG tablet Take 1 tablet by mouth 2 (two) times daily.    Marland Kitchen losartan (COZAAR) 50 MG tablet TAKE 1 TABLET BY MOUTH  DAILY 90 tablet 0  . meloxicam (MOBIC) 15 MG tablet TAKE 1 TABLET BY MOUTH  DAILY 90 tablet 1  . Misc Natural Products (SUPER ENERGY HERBAL COMPLEX PO) Take by mouth.    . montelukast (SINGULAIR) 10 MG tablet TAKE 1 TABLET BY MOUTH AT  BEDTIME 90 tablet 0  . Multiple Vitamin (MULTIVITAMIN WITH MINERALS) TABS tablet Take 1 tablet by mouth daily.    Marland Kitchen omeprazole (PRILOSEC) 20 MG capsule TAKE 1 CAPSULE BY MOUTH  DAILY 90 capsule 0  . Potassium Gluconate 595 MG CAPS Take by mouth.    . pravastatin (PRAVACHOL) 20 MG tablet TAKE 1 TABLET BY MOUTH  DAILY 90 tablet 0  . traMADol (ULTRAM) 50 MG tablet TAKE 1 TABLET(50 MG) BY MOUTH AT BEDTIME 30 tablet 3  . vitamin C (VITAMIN C) 1000 MG tablet Take 1 tablet (1,000 mg total) by mouth daily. 30 tablet 0   No current facility-administered medications on file prior to visit.      Past Medical History:  Diagnosis Date  . Allergy   . Anemia   . Arthritis   . GERD (gastroesophageal reflux disease)   . Hyperlipidemia    Allergies  Allergen Reactions  . Sulfur Swelling and Other (See Comments)    Reaction:  All over body swelling   . Penicillins Rash and Other (See Comments)    Has patient had a PCN reaction causing immediate rash, facial/tongue/throat swelling, SOB or lightheadedness with hypotension: No Has patient had a PCN reaction causing severe rash involving mucus membranes or skin necrosis: No Has patient had a PCN reaction that required hospitalization No Has patient  had a PCN reaction occurring within the last 10 years: No If all of the above answers are "NO", then may proceed with Cephalosporin use.    Social History   Socioeconomic History  . Marital status: Widowed    Spouse name: Not on file  . Number of children: 3  . Years of education: Not on file  . Highest education level: Not on file  Occupational History  . Occupation: Surveyor, quantity: [employer]    Comment: works 25-30hrs/week  Social Needs  . Financial resource strain: Not hard at all  . Food insecurity    Worry: Never true    Inability: Never true  . Transportation needs    Medical: No    Non-medical: No  Tobacco Use  . Smoking status: Former Research scientist (life sciences)  . Smokeless tobacco: Never Used  Substance and Sexual Activity  . Alcohol  use: No  . Drug use: No  . Sexual activity: Not Currently  Lifestyle  . Physical activity    Days per week: 0 days    Minutes per session: 0 min  . Stress: Not at all  Relationships  . Social Herbalist on phone: Three times a week    Gets together: Twice a week    Attends religious service: More than 4 times per year    Active member of club or organization: Yes    Attends meetings of clubs or organizations: More than 4 times per year    Relationship status: Widowed  Other Topics Concern  . Not on file  Social History Narrative   01/22/2019:   Widowed since 10/2017; lives alone now in [place] house with 2 dogs   Has one son, and one stepson and Psychiatrist. Sons live close by, supportive.   Has 5 grandchildren and several great grandchildren, active in their lives   Continues to work PT as Scientist, water quality, likes to stay busy   [employer] community support system    Vitals:   08/06/19 0709  BP: 120/74  Pulse: 76  Resp: 12  Temp: 97.6 F (36.4 C)  SpO2: 95%   Body mass index is 29.11 kg/m.  Physical Exam  Nursing note and vitals reviewed. Constitutional: She is oriented to person, place, and time. She appears well-developed.  No distress.  HENT:  Head: Normocephalic and atraumatic.  Mouth/Throat: Oropharynx is clear and moist and mucous membranes are normal.  Eyes: Pupils are equal, round, and reactive to light. Conjunctivae are normal.  Cardiovascular: Normal rate and regular rhythm.  No murmur heard. Pulses:      Dorsalis pedis pulses are 2+ on the right side and 2+ on the left side.  Respiratory: Effort normal and breath sounds normal. No respiratory distress.  GI: Soft. She exhibits no mass. There is no hepatomegaly. There is no abdominal tenderness.  Musculoskeletal:        General: No edema.     Comments: Some IP joints with Heberden's node (DIP). No signs of synovitis. Mildly limitation of flexion right wrist. Antalgic gait, limping.  Lymphadenopathy:    She has no cervical adenopathy.  Neurological: She is alert and oriented to person, place, and time. She has normal strength. No cranial nerve deficit. Abnormal gait: Not assistance needed.  Skin: Skin is warm. No rash noted. No erythema.  Psychiatric: She has a normal mood and affect.  Well groomed, good eye contact.    ASSESSMENT AND PLAN:  [name] was seen today for follow-up.  Diagnoses and all orders for this visit:  Lab Results  Component Value Date   CHOL 164 08/06/2019   HDL 44.00 08/06/2019   LDLCALC 97 08/06/2019   TRIG 114.0 08/06/2019   CHOLHDL 4 08/06/2019   Lab Results  Component Value Date   CREATININE 0.82 08/06/2019   BUN 17 08/06/2019   NA 134 (L) 08/06/2019   K 4.5 08/06/2019   CL 98 08/06/2019   CO2 29 08/06/2019   Lab Results  Component Value Date   ALT 19 08/06/2019   AST 23 08/06/2019   ALKPHOS 78 08/06/2019   BILITOT 0.4 08/06/2019    Chronic pain disorder We discussed current guidelines. Sorrel controlled subs web site reviewed. Med contract was not signed today, will plan on doing so next visit; but we have a verbal agreement.  Generalized osteoarthritis of multiple sites Tramadol 50 mg helping  with night pain.  Continue Meloxicam 15 mg daily as needed. She can add Tylenol 500 mg 3-4 tomes per day.  Essential hypertension, benign Adequately controlled. No changes in current management. Low salt diet to continue. Monitor BP regularly. Eye exam recommended annually. F/U in 6 months, before if needed.  -     Comprehensive metabolic panel  Hyperlipidemia, unspecified hyperlipidemia type No changes in current management, will follow labs done today and will give further recommendations accordingly.  -     Comprehensive metabolic panel -     Lipid panel  Benign paroxysmal positional vertigo, unspecified laterality We discussed other possible etiologies of dizziness, Hx and examination today suggest benign vertigo. I do not think further work-up is necessary at this time, she has already been evaluated by ENT. Explained that problem can be recurrent. Fall prevention. Vestibular therapy will be arranged. Meclizine 25 mg bid prn, some side effects discussed. Instructed about warning signs. F/U as needed.  -     Ambulatory referral to Physical Therapy -     meclizine (ANTIVERT) 25 MG tablet; Take 0.5-1 tablets (12.5-25 mg total) by mouth 2 (two) times daily as needed for dizziness.  Major depressive disorder with single episode, remission status unspecified Problem is well controlled with Citalopram 5 mg daily. No changes in current management.  Need for influenza vaccination -     Flu Vaccine QUAD High Dose(Fluad)  Osteopenia of neck of left femur We reviewed DEXA results, message about result was sent through my chart after I received results. Treatment options and side effects. She would like to start Fosamax. Fall prevention. Instructed to complete any dental work she needs before starting medication.  -     alendronate (FOSAMAX) 70 MG tablet; Take 1 tablet (70 mg total) by mouth every 7 (seven) days. Take with a full glass of water on an empty stomach.    Return  in about 6 months (around 02/03/2020) for f/u.    -[name] was advised to return sooner than planned today if new concerns arise.       Tabitha Mangiapane G. [name], MD  [facility]. [place] office.

## 2019-08-06 NOTE — Patient Instructions (Addendum)
A few things to remember from today's visit:   Chronic pain disorder  Generalized osteoarthritis of multiple sites  Essential hypertension, benign - Plan: Comprehensive metabolic panel  Hyperlipidemia, unspecified hyperlipidemia type - Plan: Comprehensive metabolic panel, Lipid panel  Benign paroxysmal positional vertigo, unspecified laterality - Plan: Ambulatory referral to Physical Therapy, meclizine (ANTIVERT) 25 MG tablet  Major depressive disorder with single episode, remission status unspecified   Benign Positional Vertigo Vertigo is the feeling that you or your surroundings are moving when they are not. Benign positional vertigo is the most common form of vertigo. This is usually a harmless condition (benign). This condition is positional. This means that symptoms are triggered by certain movements and positions. This condition can be dangerous if it occurs while you are doing something that could cause harm to you or others. This includes activities such as driving or operating machinery. What are the causes? In many cases, the cause of this condition is not known. It may be caused by a disturbance in an area of the inner ear that helps your brain to sense movement and balance. This disturbance can be caused by:  Viral infection (labyrinthitis).  Head injury.  Repetitive motion, such as jumping, dancing, or running. What increases the risk? You are more likely to develop this condition if:  You are a woman.  You are 41 years of age or older. What are the signs or symptoms? Symptoms of this condition usually happen when you move your head or your eyes in different directions. Symptoms may start suddenly, and usually last for less than a minute. They include:  Loss of balance and falling.  Feeling like you are spinning or moving.  Feeling like your surroundings are spinning or moving.  Nausea and vomiting.  Blurred vision.  Dizziness.  Involuntary eye movement  (nystagmus). Symptoms can be mild and cause only minor problems, or they can be severe and interfere with daily life. Episodes of benign positional vertigo may return (recur) over time. Symptoms may improve over time. How is this diagnosed? This condition may be diagnosed based on:  Your medical history.  Physical exam of the head, neck, and ears.  Tests, such as: ? MRI. ? CT scan. ? Eye movement tests. Your health care provider may ask you to change positions quickly while he or she watches you for symptoms of benign positional vertigo, such as nystagmus. Eye movement may be tested with a variety of exams that are designed to evaluate or stimulate vertigo. ? An electroencephalogram (EEG). This records electrical activity in your brain. ? Hearing tests. You may be referred to a health care provider who specializes in ear, nose, and throat (ENT) problems (otolaryngologist) or a provider who specializes in disorders of the nervous system (neurologist). How is this treated?  This condition may be treated in a session in which your health care provider moves your head in specific positions to adjust your inner ear back to normal. Treatment for this condition may take several sessions. Surgery may be needed in severe cases, but this is rare. In some cases, benign positional vertigo may resolve on its own in 2-4 weeks. Follow these instructions at home: Safety  Move slowly. Avoid sudden body or head movements or certain positions, as told by your health care provider.  Avoid driving until your health care provider says it is safe for you to do so.  Avoid operating heavy machinery until your health care provider says it is safe for you to do so.  Avoid doing any tasks that would be dangerous to you or others if vertigo occurs.  If you have trouble walking or keeping your balance, try using a cane for stability. If you feel dizzy or unstable, sit down right away.  Return to your normal  activities as told by your health care provider. Ask your health care provider what activities are safe for you. General instructions  Take over-the-counter and prescription medicines only as told by your health care provider.  Drink enough fluid to keep your urine pale yellow.  Keep all follow-up visits as told by your health care provider. This is important. Contact a health care provider if:  You have a fever.  Your condition gets worse or you develop new symptoms.  Your family or friends notice any behavioral changes.  You have nausea or vomiting that gets worse.  You have numbness or a "pins and needles" sensation. Get help right away if you:  Have difficulty speaking or moving.  Are always dizzy.  Faint.  Develop severe headaches.  Have weakness in your legs or arms.  Have changes in your hearing or vision.  Develop a stiff neck.  Develop sensitivity to light. Summary  Vertigo is the feeling that you or your surroundings are moving when they are not. Benign positional vertigo is the most common form of vertigo.  The cause of this condition is not known. It may be caused by a disturbance in an area of the inner ear that helps your brain to sense movement and balance.  Symptoms include loss of balance and falling, feeling that you or your surroundings are moving, nausea and vomiting, and blurred vision.  This condition can be diagnosed based on symptoms, physical exam, and other tests, such as MRI, CT scan, eye movement tests, and hearing tests.  Follow safety instructions as told by your health care provider. You will also be told when to contact your health care provider in case of problems. This information is not intended to replace advice given to you by your health care provider. Make sure you discuss any questions you have with your health care provider. Document Released: 08/09/2006 Document Revised: 04/12/2018 Document Reviewed: 04/12/2018 Elsevier  Patient Education  [place].  Please be sure medication list is accurate. If a new problem present, please set up appointment sooner than planned today.

## 2019-08-09 ENCOUNTER — Encounter: Payer: Self-pay | Admitting: Family Medicine

## 2019-08-09 MED ORDER — PRAVASTATIN SODIUM 20 MG PO TABS: 20.0000 mg | ORAL_TABLET | Freq: Every day | ORAL | 3 refills | Status: DC

## 2019-09-19 ENCOUNTER — Telehealth: Payer: Self-pay | Admitting: Family Medicine

## 2019-09-19 NOTE — Telephone Encounter (Signed)
Copied from [place] [id number]. Topic: General - Inquiry >> Sep 19, 2019  8:48 AM Richardo Priest, NT wrote: Reason for CRM: Pt called in stating she would like to know if PCP can give anything for sinus cold that began Saturday. Pt has no fever just drainage. Preferred pharmacy is [facility] [place], [place] AT [address] [phone] (Phone) [phone] (Fax). Please advise.

## 2019-09-19 NOTE — Telephone Encounter (Signed)
Patient needs a virtual visit

## 2019-09-19 NOTE — Telephone Encounter (Signed)
Pt called in stating she would like a call from [place] if Dr.Jordan can send something in as she thought it would be called in by now. Please advise.

## 2019-09-20 ENCOUNTER — Telehealth (INDEPENDENT_AMBULATORY_CARE_PROVIDER_SITE_OTHER): Payer: Medicare Other | Admitting: Family Medicine

## 2019-09-20 ENCOUNTER — Encounter: Payer: Self-pay | Admitting: Family Medicine

## 2019-09-20 ENCOUNTER — Other Ambulatory Visit: Payer: Self-pay

## 2019-09-20 VITALS — Temp 98.1°F | Wt 142.0 lb

## 2019-09-20 DIAGNOSIS — R0981 Nasal congestion: Secondary | ICD-10-CM | POA: Diagnosis not present

## 2019-09-20 DIAGNOSIS — R05 Cough: Secondary | ICD-10-CM

## 2019-09-20 DIAGNOSIS — R059 Cough, unspecified: Secondary | ICD-10-CM

## 2019-09-20 MED ORDER — BENZONATATE 100 MG PO CAPS: 100.0000 mg | ORAL_CAPSULE | Freq: Three times a day (TID) | ORAL | 0 refills | Status: DC | PRN

## 2019-09-20 NOTE — Progress Notes (Signed)
Virtual Visit via Telephone Note  I connected with Tabitha Adams on 09/20/19 at  3:40 PM EST by telephone and verified that I am speaking with the correct person using two identifiers.   I discussed the limitations, risks, security and privacy concerns of performing an evaluation and management service by telephone and the availability of in person appointments. I also discussed with the patient that there may be a patient responsible charge related to this service. The patient expressed understanding and agreed to proceed.  Location patient: home Location provider: work or home office Participants present for the call: patient, provider Patient did not have a visit in the prior 7 days to address this/these issue(s).   History of Present Illness:   Acute visit for sinus congestion: -started 5 days ago -symptoms include sinus congestion, cough, PND -denies SOB, fevers, NVD, loss of taste, pain in the sinuses, body aches -she does get allergies -she is using her zyrtec and she has used -she has not had known sick contact, she works at [employer], everyone wears masks  ROS: See pertinent positives and negatives per HPI.  Observations/Objective: Patient sounds cheerful and well on the phone. I do not appreciate any SOB. Speech and thought processing are grossly intact. Patient reported vitals:  ASSESSMENT AND PLAN:  Discussed the following assessment and plan:  Sinus congestion  Cough  -we discussed possible serious and likely etiologies, options for evaluation and workup, limitations of telemedicine visit vs in person visit, treatment, treatment risks and precautions. Pt prefers to treat via telemedicine empirically rather then risking or undertaking an in person visit at this moment. Suspect seasonal allergies - possible VURI. She opted for symptomatic care and tessalon for cough. Rx sent. She feels is unlikely COVID19 and she declined testing for this. She agrees to home  isolation until symptoms are resolved for several day and use of masking and social distancing after that just in case.  Patient agrees to seek prompt in person care if worsening, new symptoms arise, or if is not improving with treatment.   Follow Up Instructions:  I did not refer this patient for an OV in the next 24 hours for this/these issue(s).  I discussed the assessment and treatment plan with the patient. The patient was provided an opportunity to ask questions and all were answered. The patient agreed with the plan and demonstrated an understanding of the instructions.   The patient was advised to call back or seek an in-person evaluation if the symptoms worsen or if the condition fails to improve as anticipated.  I provided 15 minutes of non-face-to-face time during this encounter.   Lucretia Kern, DO   Patient Instructions   -I sent the medication(s) we discussed to your pharmacy: Meds ordered this encounter  Medications  . benzonatate (TESSALON PERLES) 100 MG capsule    Sig: Take 1 capsule (100 mg total) by mouth 3 (three) times daily as needed.    Dispense:  20 capsule    Refill:  0    Please let us know if you have any questions or concerns regarding this prescription.  I hope you are feeling better soon! Seek care promptly if your symptoms worsen, new concerns arise or you are not improving with treatment over the next several days.

## 2019-09-20 NOTE — Patient Instructions (Signed)
-  I sent the medication(s) we discussed to your pharmacy: Meds ordered this encounter  Medications  . benzonatate (TESSALON PERLES) 100 MG capsule    Sig: Take 1 capsule (100 mg total) by mouth 3 (three) times daily as needed.    Dispense:  20 capsule    Refill:  0    Please let us know if you have any questions or concerns regarding this prescription.  I hope you are feeling better soon! Seek care promptly if your symptoms worsen, new concerns arise or you are not improving with treatment over the next several days.

## 2019-10-01 DIAGNOSIS — Z03818 Encounter for observation for suspected exposure to other biological agents ruled out: Secondary | ICD-10-CM | POA: Diagnosis not present

## 2019-10-19 ENCOUNTER — Other Ambulatory Visit: Payer: Self-pay | Admitting: Family Medicine

## 2019-10-19 DIAGNOSIS — K219 Gastro-esophageal reflux disease without esophagitis: Secondary | ICD-10-CM

## 2019-10-19 DIAGNOSIS — J309 Allergic rhinitis, unspecified: Secondary | ICD-10-CM

## 2019-10-19 DIAGNOSIS — I10 Essential (primary) hypertension: Secondary | ICD-10-CM

## 2019-10-20 ENCOUNTER — Other Ambulatory Visit: Payer: Self-pay | Admitting: Family Medicine

## 2019-10-20 DIAGNOSIS — G894 Chronic pain syndrome: Secondary | ICD-10-CM

## 2019-10-20 DIAGNOSIS — M159 Polyosteoarthritis, unspecified: Secondary | ICD-10-CM

## 2019-12-18 ENCOUNTER — Other Ambulatory Visit: Payer: Self-pay

## 2019-12-18 DIAGNOSIS — J309 Allergic rhinitis, unspecified: Secondary | ICD-10-CM

## 2019-12-18 MED ORDER — FLUTICASONE PROPIONATE 50 MCG/ACT NA SUSP: 1.0000 | Freq: Two times a day (BID) | NASAL | 11 refills | Status: DC | PRN

## 2020-01-05 ENCOUNTER — Ambulatory Visit: Payer: Medicare Other | Attending: Internal Medicine

## 2020-01-05 DIAGNOSIS — Z23 Encounter for immunization: Secondary | ICD-10-CM | POA: Insufficient documentation

## 2020-01-05 NOTE — Progress Notes (Signed)
   Covid-19 Vaccination Clinic  Name:  Tabitha Adams    MRN: [id number] DOB: [birth year]  01/05/2020  [name] was observed post Covid-19 immunization for 15 minutes without incidence. She was provided with Vaccine Information Sheet and instruction to access the V-Safe system.   [name] was instructed to call 911 with any severe reactions post vaccine: Marland Kitchen Difficulty breathing  . Swelling of your face and throat  . A fast heartbeat  . A bad rash all over your body  . Dizziness and weakness    Immunizations Administered    Name Date Dose VIS Date Route   Pfizer COVID-19 Vaccine 01/05/2020  3:28 PM 0.3 mL 10/26/2019 Intramuscular   Manufacturer: [place]   Lot: [id number]   [place]: [id number]

## 2020-01-30 ENCOUNTER — Ambulatory Visit: Payer: Medicare Other | Attending: Internal Medicine

## 2020-01-30 DIAGNOSIS — Z23 Encounter for immunization: Secondary | ICD-10-CM

## 2020-01-30 NOTE — Progress Notes (Signed)
   Covid-19 Vaccination Clinic  Name:  Tabitha Adams    MRN: [id number] DOB: [birth year]  01/30/2020  [name] was observed post Covid-19 immunization for 15 minutes without incident. She was provided with Vaccine Information Sheet and instruction to access the V-Safe system.   [name] was instructed to call 911 with any severe reactions post vaccine: Marland Kitchen Difficulty breathing  . Swelling of face and throat  . A fast heartbeat  . A bad rash all over body  . Dizziness and weakness   Immunizations Administered    Name Date Dose VIS Date Route   Pfizer COVID-19 Vaccine 01/30/2020  4:10 PM 0.3 mL 10/26/2019 Intramuscular   Manufacturer: [place]   Lot: [id number]   [place]: [id number]

## 2020-02-04 ENCOUNTER — Telehealth (INDEPENDENT_AMBULATORY_CARE_PROVIDER_SITE_OTHER): Payer: Medicare Other | Admitting: Family Medicine

## 2020-02-04 ENCOUNTER — Encounter: Payer: Self-pay | Admitting: Family Medicine

## 2020-02-04 ENCOUNTER — Other Ambulatory Visit: Payer: Self-pay

## 2020-02-04 VITALS — BP 128/70 | HR 94 | Resp 12 | Ht 59.0 in | Wt 143.0 lb

## 2020-02-04 DIAGNOSIS — N3941 Urge incontinence: Secondary | ICD-10-CM | POA: Diagnosis not present

## 2020-02-04 DIAGNOSIS — G894 Chronic pain syndrome: Secondary | ICD-10-CM | POA: Diagnosis not present

## 2020-02-04 DIAGNOSIS — I10 Essential (primary) hypertension: Secondary | ICD-10-CM

## 2020-02-04 DIAGNOSIS — M159 Polyosteoarthritis, unspecified: Secondary | ICD-10-CM

## 2020-02-04 DIAGNOSIS — R200 Anesthesia of skin: Secondary | ICD-10-CM

## 2020-02-04 DIAGNOSIS — N8110 Cystocele, unspecified: Secondary | ICD-10-CM

## 2020-02-04 DIAGNOSIS — F329 Major depressive disorder, single episode, unspecified: Secondary | ICD-10-CM | POA: Diagnosis not present

## 2020-02-04 MED ORDER — TRAMADOL HCL 50 MG PO TABS: ORAL_TABLET | ORAL | 3 refills | Status: DC

## 2020-02-04 MED ORDER — CITALOPRAM HYDROBROMIDE 10 MG PO TABS: 5.0000 mg | ORAL_TABLET | Freq: Every day | ORAL | 0 refills | Status: DC

## 2020-02-04 NOTE — Assessment & Plan Note (Signed)
[place] controlled substance report website reviewed. Med contract is up-to-date. No changes in tramadol dose.

## 2020-02-04 NOTE — Patient Instructions (Signed)
A few things to remember from today's visit:   This exercise helps with mild urine leakage associated with cough, laughing, or sneezing. It may help with other types of urine incontinence and even with mild fecal incontinence.   Tighten and relax the pelvic muscles intermittently during the day. Once you are familiar with exercise try to hold pelvic muscles contraction for about 8-10 seconds.in the beginning you may not be able to hold contraction for more than a second or 2 but eventually you will be able to hold contraction harder and for longer time. Perform  8-12 exercises 3 times per day and daily for 15-20 weeks. You will need to continue exercises indefinitely to have a lasting effect.   Celexa 5 mg every 2 days x 10 days then every 3rd day x 10 days. No changes in rest of meds.  About Cystocele  Overview  The pelvic organs, including the bladder, are normally supported by pelvic floor muscles and ligaments.  When these muscles and ligaments are stretched, weakened or torn, the wall between the bladder and the vagina sags or herniates causing a prolapse, sometimes called a cystocele.  This condition may cause discomfort and problems with emptying the bladder.  It can be present in various stages.  Some people are not aware of the changes.  Others may notice changes at the vaginal opening or a feeling of the bladder dropping outside the body.  Causes of a Cystocele  A cystocele is usually caused by muscle straining or stretching during childbirth.  In addition, cystocele is more common after menopause, because the hormone estrogen helps keep the elastic tissues around the pelvic organs strong.  A cystocele is more likely to occur when levels of estrogen decrease.  Other causes include: heavy lifting, chronic coughing, previous pelvic surgery and obesity.  Symptoms  A bladder that has dropped from its normal position may cause: unwanted urine leakage (stress incontinence), frequent  urination or urge to urinate, incomplete emptying of the bladder (not feeling bladder relief after emptying), pain or discomfort in the vagina, pelvis, groin, lower back or lower abdomen and frequent urinary tract infections.  Mild cases may not cause any symptoms.  Treatment Options  Pelvic floor (Kegel) exercises:  Strength training the muscles in your genital area  Behavioral changes: Treating and preventing constipation, taking time to empty your bladder properly, learning to lift properly and/or avoid heavy lifting when possible, stopping smoking, avoiding weight gain and treating a chronic cough or bronchitis.  A pessary: A vaginal support device is sometimes used to help pelvic support caused by muscle and ligament changes.  Surgery: Surgical repair may be necessary if symptoms cannot be managed with exercise, behavioral changes and a pessary.  Surgery is usually considered for severe cases.   2007, [employer]

## 2020-02-04 NOTE — Assessment & Plan Note (Signed)
BP adequately controlled. Continue losartan 50 mg daily and low-salt diet.

## 2020-02-04 NOTE — Progress Notes (Signed)
HPI:   Tabitha Adams is a 74 y.o. female, who is here today for chronic disease management. She was last seen on 08/06/2019.  Hypertension: Currently she is on losartan 50 mg daily Negative for severe/frequent headache, visual changes, chest pain, dyspnea, palpitation, focal weakness, or edema.  Lab Results  Component Value Date   CREATININE 0.82 08/06/2019   BUN 17 08/06/2019   NA 134 (L) 08/06/2019   K 4.5 08/06/2019   CL 98 08/06/2019   CO2 29 08/06/2019   Depression: She is on Celexa 5 mg daily. Depression is greatly improved, she would like to start weaning off medication. She already tried to wean off Celexa and because mild depressed mood, she decided to resume it.  Chronic pain/generalized OA: Currently she is on meloxicam 15 mg daily and tramadol 50 mg at bedtime. Medication is still helping with pain. Pain is exacerbated by repetitive activities/cold weather and alleviated by rest. + Stiffness. Pain can be severe and debilitating.  Today she is complaining about left fingers numbness. She has history of carpal tunnel syndrome right hand. Exacerbating by holding objects. Alleviated by shaking hand.  She is right-handed but she holds things with her left hand to be able to use her right hand with main activities. Negative for neck pain. She has not noted erythema, edema, skin rash, or cyanosis.  Urinary urgency/incontinence:Negative for dysuria, gross hematuria, increased urinary frequency, abdominal pain, vaginal discharge, vaginal bleeding. She has no problems with urgency or incontinence. Urgency starts once she gets home from work.  For a while she feels something protruding through the vagina with prolonged standing. Problem seems to be getting worse. She has not noted genital lesions.  Review of Systems  Constitutional: Negative for activity change, appetite change, fatigue and fever.  HENT: Negative for mouth sores, nosebleeds and sore  throat.   Respiratory: Negative for cough and wheezing.   Gastrointestinal: Negative for nausea and vomiting.       Negative for changes in bowel habits.  Endocrine: Negative for cold intolerance and heat intolerance.  Genitourinary: Negative for decreased urine volume and difficulty urinating.  Musculoskeletal: Positive for back pain and gait problem.  Allergic/Immunologic: Positive for environmental allergies.  Neurological: Negative for syncope, facial asymmetry and speech difficulty.  Psychiatric/Behavioral: Negative for confusion. The patient is nervous/anxious.   Rest of ROS, see pertinent positives sand negatives in HPI  Current Outpatient Medications on File Prior to Visit  Medication Sig Dispense Refill  . alendronate (FOSAMAX) 70 MG tablet Take 1 tablet (70 mg total) by mouth every 7 (seven) days. Take with a full glass of water on an empty stomach. 13 tablet 2  . b complex vitamins capsule Take 1 capsule by mouth daily.    . Calcium-Phosphorus-Vitamin D (CALCIUM GUMMIES) [id number] MG-MG-UNIT CHEW Chew by mouth.    . cetirizine (ZYRTEC) 10 MG tablet Take 10 mg by mouth at bedtime.    . Cholecalciferol (VITAMIN D3) 50 MCG (2000 UT) TABS Take by mouth daily.    . ferrous sulfate 325 (65 FE) MG tablet Take 325 mg by mouth every Monday, Wednesday, and Friday.    . fluticasone (FLONASE) 50 MCG/ACT nasal spray Place 1 spray into both nostrils 2 (two) times daily as needed for allergies or rhinitis. 16 g 11  . glucosamine-chondroitin 500-400 MG tablet Take 1 tablet by mouth 2 (two) times daily.    Marland Kitchen losartan (COZAAR) 50 MG tablet TAKE 1 TABLET BY MOUTH  DAILY 90  tablet 3  . meclizine (ANTIVERT) 25 MG tablet Take 0.5-1 tablets (12.5-25 mg total) by mouth 2 (two) times daily as needed for dizziness. 45 tablet 1  . meloxicam (MOBIC) 15 MG tablet TAKE 1 TABLET BY MOUTH  DAILY 90 tablet 3  . Misc Natural Products (SUPER ENERGY HERBAL COMPLEX PO) Take by mouth.    . montelukast (SINGULAIR)  10 MG tablet TAKE 1 TABLET BY MOUTH AT  BEDTIME 90 tablet 3  . Multiple Vitamin (MULTIVITAMIN WITH MINERALS) TABS tablet Take 1 tablet by mouth daily.    Marland Kitchen omeprazole (PRILOSEC) 20 MG capsule TAKE 1 CAPSULE BY MOUTH  DAILY 90 capsule 3  . Potassium Gluconate 595 MG CAPS Take by mouth.    . pravastatin (PRAVACHOL) 20 MG tablet Take 1 tablet (20 mg total) by mouth daily. 90 tablet 3  . vitamin C (VITAMIN C) 1000 MG tablet Take 1 tablet (1,000 mg total) by mouth daily. 30 tablet 0   No current facility-administered medications on file prior to visit.     Past Medical History:  Diagnosis Date  . Allergy   . Anemia   . Arthritis   . GERD (gastroesophageal reflux disease)   . Hyperlipidemia    Allergies  Allergen Reactions  . Sulfur Swelling and Other (See Comments)    Reaction:  All over body swelling   . Penicillins Rash and Other (See Comments)    Has patient had a PCN reaction causing immediate rash, facial/tongue/throat swelling, SOB or lightheadedness with hypotension: No Has patient had a PCN reaction causing severe rash involving mucus membranes or skin necrosis: No Has patient had a PCN reaction that required hospitalization No Has patient had a PCN reaction occurring within the last 10 years: No If all of the above answers are "NO", then may proceed with Cephalosporin use.    Social History   Socioeconomic History  . Marital status: Widowed    Spouse name: Not on file  . Number of children: 3  . Years of education: Not on file  . Highest education level: Not on file  Occupational History  . Occupation: Surveyor, quantity: [employer]    Comment: works 25-30hrs/week  Tobacco Use  . Smoking status: Former Research scientist (life sciences)  . Smokeless tobacco: Never Used  Substance and Sexual Activity  . Alcohol use: No  . Drug use: No  . Sexual activity: Not Currently  Other Topics Concern  . Not on file  Social History Narrative   01/22/2019:   Widowed since 10/2017; lives alone now in  [place] house with 2 dogs   Has one son, and one stepson and Psychiatrist. Sons live close by, supportive.   Has 5 grandchildren and several great grandchildren, active in their lives   Continues to work PT as Scientist, water quality, likes to stay busy   [employer] community support system   Social Determinants of Radio broadcast assistant Strain:   . Difficulty of Paying Living Expenses:   Food Insecurity:   . Worried About Charity fundraiser in the Last Year:   . Arboriculturist in the Last Year:   Transportation Needs:   . Film/video editor (Medical):   Marland Kitchen Lack of Transportation (Non-Medical):   Physical Activity:   . Days of Exercise per Week:   . Minutes of Exercise per Session:   Stress:   . Feeling of Stress :   Social Connections:   . Frequency of Communication with Friends and Family:   .  Frequency of Social Gatherings with Friends and Family:   . Attends Religious Services:   . Active Member of Clubs or Organizations:   . Attends Archivist Meetings:   Marland Kitchen Marital Status:     Vitals:   02/04/20 1542  BP: 128/70  Pulse: 94  Resp: 12  SpO2: 98%   Body mass index is 28.88 kg/m.  Physical Exam  Nursing note and vitals reviewed. Constitutional: She is oriented to person, place, and time. She appears well-developed. No distress.  HENT:  Head: Normocephalic and atraumatic.  Eyes: Pupils are equal, round, and reactive to light. Conjunctivae are normal.  Cardiovascular: Normal rate and regular rhythm.  No murmur heard. Pulses:      Dorsalis pedis pulses are 2+ on the right side and 2+ on the left side.  Respiratory: Effort normal and breath sounds normal. No respiratory distress.  GI: Soft. She exhibits no mass. There is no hepatomegaly. There is no abdominal tenderness.  Genitourinary: There is no rash, tenderness or lesion on the right labia. There is no rash, tenderness or lesion on the left labia.    No vaginal discharge or erythema.  No erythema in the vagina.     Genitourinary Comments: + Cystocele.   Musculoskeletal:        General: No edema.     Comments: Negative tinel and Phalen left hand. No signs of synovitis. Antalgic gait.  Lymphadenopathy:    She has no cervical adenopathy.  Neurological: She is alert and oriented to person, place, and time. She has normal strength. No cranial nerve deficit.  Skin: Skin is warm. No rash noted. No erythema.  Psychiatric: She has a normal mood and affect.  Well groomed, good eye contact.   ASSESSMENT AND PLAN:   Tabitha Adams was seen today for chronic disease management.  Orders Placed This Encounter  Procedures  . Basic metabolic panel  . Urinalysis, Routine w reflex microscopic  . Vitamin B12  . Ambulatory referral to Gynecology   Lab Results  Component Value Date   CREATININE 0.83 02/04/2020   BUN 20 02/04/2020   NA 134 (L) 02/04/2020   K 4.2 02/04/2020   CL 100 02/04/2020   CO2 27 02/04/2020   Lab Results  Component Value Date   VITAMINB12 1,075 (H) 02/04/2020    Urgency incontinence Kegel and pelvic floor exercises recommended for now. Schedule voiding before leaving work. Further recommendations according to UA results.  Numbness of left hand ? Carpal tunnel synd,mild. Recommend wearing a wrist splint   Female cystocele Gyn/urology referral placed.  Essential hypertension, benign BP adequately controlled. Continue losartan 50 mg daily and low-salt diet.   Generalized osteoarthritis of multiple sites Problem is is stable. Continue meloxicam 15 mg daily and tramadol 50 mg twice daily as needed.  Chronic pain disorder [place] controlled substance report website reviewed. Med contract is up-to-date. No changes in tramadol dose.   Depression, major, single episode She would like to try to wean off medication again. Celexa 5 mg every other day for 10 days then every third day for 10 days. If symptoms recur, she will continue Celexa 5 mg indefinitely.    Return in about 6 months (around 08/06/2020) for 5-6 months f/u and AWV.  Visit was face to face.  Mana Haberl G. [name], MD  [facility]. [place] office.

## 2020-02-04 NOTE — Assessment & Plan Note (Signed)
Problem is is stable. Continue meloxicam 15 mg daily and tramadol 50 mg twice daily as needed.

## 2020-02-04 NOTE — Assessment & Plan Note (Signed)
She would like to try to wean off medication again. Celexa 5 mg every other day for 10 days then every third day for 10 days. If symptoms recur, she will continue Celexa 5 mg indefinitely.

## 2020-02-05 LAB — URINALYSIS, ROUTINE W REFLEX MICROSCOPIC
Bilirubin Urine: NEGATIVE
Hgb urine dipstick: NEGATIVE
Ketones, ur: NEGATIVE
Nitrite: POSITIVE — AB
RBC / HPF: NONE SEEN (ref 0–?)
Specific Gravity, Urine: 1.02 (ref 1.000–1.030)
Total Protein, Urine: NEGATIVE
Urine Glucose: NEGATIVE
Urobilinogen, UA: 0.2 (ref 0.0–1.0)
pH: 6 (ref 5.0–8.0)

## 2020-02-05 LAB — BASIC METABOLIC PANEL
BUN: 20 mg/dL (ref 6–23)
CO2: 27 mEq/L (ref 19–32)
Calcium: 9.7 mg/dL (ref 8.4–10.5)
Chloride: 100 mEq/L (ref 96–112)
Creatinine, Ser: 0.83 mg/dL (ref 0.40–1.20)
GFR: 67.21 mL/min (ref >60.00)
Glucose, Bld: 88 mg/dL (ref 70–99)
Potassium: 4.2 mEq/L (ref 3.5–5.1)
Sodium: 134 mEq/L — ABNORMAL LOW (ref 135–145)

## 2020-02-05 LAB — VITAMIN B12: Vitamin B-12: 1075 pg/mL — ABNORMAL HIGH (ref 211–911)

## 2020-02-07 MED ORDER — NITROFURANTOIN MONOHYD MACRO 100 MG PO CAPS: 100.0000 mg | ORAL_CAPSULE | Freq: Two times a day (BID) | ORAL | 0 refills | Status: AC

## 2020-02-20 ENCOUNTER — Other Ambulatory Visit: Payer: Self-pay | Admitting: Family Medicine

## 2020-02-20 DIAGNOSIS — G894 Chronic pain syndrome: Secondary | ICD-10-CM

## 2020-02-20 DIAGNOSIS — M159 Polyosteoarthritis, unspecified: Secondary | ICD-10-CM

## 2020-02-21 DIAGNOSIS — H04123 Dry eye syndrome of bilateral lacrimal glands: Secondary | ICD-10-CM | POA: Diagnosis not present

## 2020-02-21 DIAGNOSIS — Z961 Presence of intraocular lens: Secondary | ICD-10-CM | POA: Diagnosis not present

## 2020-03-03 ENCOUNTER — Other Ambulatory Visit: Payer: Self-pay

## 2020-03-03 ENCOUNTER — Encounter: Payer: Self-pay | Admitting: Obstetrics and Gynecology

## 2020-03-03 ENCOUNTER — Ambulatory Visit (INDEPENDENT_AMBULATORY_CARE_PROVIDER_SITE_OTHER): Payer: Medicare Other | Admitting: Obstetrics and Gynecology

## 2020-03-03 VITALS — BP 118/78 | Ht <= 58 in | Wt 143.0 lb

## 2020-03-03 DIAGNOSIS — N993 Prolapse of vaginal vault 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: Secondary | ICD-10-CM | POA: Diagnosis not present

## 2020-03-03 DIAGNOSIS — N8111 Cystocele, midline: Secondary | ICD-10-CM | POA: Diagnosis not present

## 2020-03-03 NOTE — Progress Notes (Signed)
   Tabitha Adams  1946-02-01 [id number]  HPI The patient is a 74 y.o. G2P0011 who presents today for concerns about her bladder falling in the past several years.  Sometimes she has a hard time emptying her bladder, sometimes having hesitancy.  No problems with recurrent UTIs.  No vaginal bleeding or pain.  Past medical history,surgical history, problem list, medications, allergies, family history and social history were all reviewed and documented as reviewed in the EPIC chart.  ROS:  Feeling well. No dyspnea or chest pain on exertion.  No abdominal pain, change in bowel habits, black or bloody stools.  Urinary symptoms as noted above. GYN ROS: no abnormal bleeding, pelvic pain or discharge, no breast pain or new or enlarging lumps on self exam. No neurological complaints.  Physical Exam  BP 118/78   Ht 4\' 10"  (1.473 m)   Wt 143 lb (64.9 kg)   BMI 29.89 kg/m   General: Pleasant female, no acute distress, alert and oriented PELVIC EXAM: VULVA: normal appearing vulva with no masses, tenderness or lesions, atrophic changes, VAGINA: normal appearing vagina with normal color and discharge, grade 3 cystocele, no lesions, CERVIX: surgically absent, UTERUS: surgically absent, right 3 vaginal cuff prolapse, ADNEXA: no masses, nontender, RECTAL: normal rectal, no masses, no rectocele  Attempt was made to place the smallest Gellhorn pessary, but this did not stay in place when the patient moved to the upright position.  She did not tolerate placement of the size 3 Gellhorn.  Next we tried the size 3 ring knob with support this was also expelled.  She did not seem like she would tolerate a size 4 so this was not attempted.  Lastly, we tried a size 3 ring pessary with support and she tolerated placement but the device looked like it was already slipping out for she even sat in the upright position.  A size 4 ring pessary with support was not available as this was used earlier in the day and was out of  commission for cleaning.  [name] present for examination  Assessment 74 year old G32P1011 with pelvic organ prolapse, predominantly cystocele and vaginal vault  Plan We used diagrams to illustrate the type of prolapse that the patient has.  We discussed risk factors for pelvic organ prolapse.  Discussed management can either be expectant, trial of pelvic floor physical therapy, trial of pessary, or surgical.  She is not interested in pursuing surgical management specially given her husband had a bad outcome with surgery. Ideally we could use a space feeling pessary such as the Gellhorn, but she did not accommodate this during the examination today, so we will have to default to either a cube or donut pessary (for space filling purpose) or a larger ring pessary with support when we have the size 4 available next time when she comes back for a pessary fitting. We discussed the maintenance required with pessary use, the possibility for increased vaginal discharge and vaginitis, possibility of vaginal erosions.  We will have her come back for a pessary fitting at her convenience in the upcoming days.   [name] MD, FACOG 03/03/20

## 2020-03-05 ENCOUNTER — Ambulatory Visit: Payer: Medicare Other | Admitting: Obstetrics and Gynecology

## 2020-03-11 ENCOUNTER — Encounter: Payer: Self-pay | Admitting: Obstetrics and Gynecology

## 2020-03-11 ENCOUNTER — Other Ambulatory Visit: Payer: Self-pay

## 2020-03-11 ENCOUNTER — Ambulatory Visit (INDEPENDENT_AMBULATORY_CARE_PROVIDER_SITE_OTHER): Payer: Medicare Other | Admitting: Obstetrics and Gynecology

## 2020-03-11 ENCOUNTER — Encounter: Payer: Self-pay | Admitting: Gynecology

## 2020-03-11 VITALS — BP 118/76

## 2020-03-11 DIAGNOSIS — N993 Prolapse of vaginal vault 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: Secondary | ICD-10-CM

## 2020-03-11 DIAGNOSIS — N8111 Cystocele, midline: Secondary | ICD-10-CM

## 2020-03-11 NOTE — Progress Notes (Signed)
   Tabitha Adams Jul 23, 1946 [id number]  SUBJECTIVE:  74 y.o. G2P1011 female presents for continuation of a pessary fitting encounter from 03/03/2020.  We wanted to trial a size 4 ring pessary with support but it was not available for trial so we had to have her come back today.  Please see the previous note for details.  We did fit a size 4 ring pessary with support and she tolerated it well and was able to move about without difficulty.  Repeat exam indicated that the device stayed in place well.  We will order her this size 4 ring pessary with support and have her return for placement when it has arrived.  There is no charge for today's encounter.   [name] MD 03/11/20

## 2020-03-27 ENCOUNTER — Ambulatory Visit (INDEPENDENT_AMBULATORY_CARE_PROVIDER_SITE_OTHER): Payer: Medicare Other | Admitting: Obstetrics and Gynecology

## 2020-03-27 ENCOUNTER — Other Ambulatory Visit: Payer: Self-pay

## 2020-03-27 ENCOUNTER — Encounter: Payer: Self-pay | Admitting: Obstetrics and Gynecology

## 2020-03-27 VITALS — BP 124/78

## 2020-03-27 DIAGNOSIS — N993 Prolapse of vaginal vault 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: Secondary | ICD-10-CM | POA: Diagnosis not present

## 2020-03-27 DIAGNOSIS — Z4689 Encounter for fitting and adjustment of other specified devices: Secondary | ICD-10-CM | POA: Diagnosis not present

## 2020-03-27 DIAGNOSIS — N8111 Cystocele, midline: Secondary | ICD-10-CM

## 2020-03-27 NOTE — Progress Notes (Signed)
   Tabitha Adams September 12, 1946 [id number]  SUBJECTIVE:  74 y.o. G22P1011 female presents for a pessary placement.  She was fitted with a size 4 ring pessary with support and this was the only one she really tolerated.  She does have vaginal vault prolapse and cystocele after having a hysterectomy about 38 years ago.  Allergies: Penicillins and Sulfur  No LMP recorded. Patient has had a hysterectomy.  Past medical history,surgical history, problem list, medications, allergies, family history and social history were all reviewed and documented as reviewed in the EPIC chart.   OBJECTIVE:  BP 124/78  The patient appears well, alert, oriented x 3, in no distress. PELVIC EXAM: VULVA: normal appearing vulva with no masses, tenderness or lesions, atrophic changes, VAGINA: normal appearing vagina with normal color and discharge, grade 3 cystocele, no lesions Pessary is fitted without difficulty Patient reports no pain or discomfort after placement.  Chaperone: [name] present during the examination  ASSESSMENT:  74 y.o. G2P1011 here for replacement  PLAN:  She will return for a short interval pessary check in 1 month and if all is well, she then can return 95-month intervals.  She should let us know if she is having any vaginal bleeding or excessive/malodorous vaginal discharge or vaginal pain or other problems.   [name] MD 03/27/20

## 2020-04-23 ENCOUNTER — Other Ambulatory Visit: Payer: Self-pay | Admitting: *Deleted

## 2020-04-23 DIAGNOSIS — M85852 Other specified disorders of bone density and structure, left thigh: Secondary | ICD-10-CM

## 2020-04-23 MED ORDER — ALENDRONATE SODIUM 70 MG PO TABS: 70.0000 mg | ORAL_TABLET | ORAL | 2 refills | Status: DC

## 2020-04-29 ENCOUNTER — Other Ambulatory Visit: Payer: Self-pay

## 2020-04-30 ENCOUNTER — Ambulatory Visit (INDEPENDENT_AMBULATORY_CARE_PROVIDER_SITE_OTHER): Payer: Medicare Other | Admitting: Obstetrics and Gynecology

## 2020-04-30 ENCOUNTER — Encounter: Payer: Self-pay | Admitting: Obstetrics and Gynecology

## 2020-04-30 VITALS — BP 122/80

## 2020-04-30 DIAGNOSIS — N8111 Cystocele, midline: Secondary | ICD-10-CM

## 2020-04-30 DIAGNOSIS — N993 Prolapse of vaginal vault 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: Secondary | ICD-10-CM

## 2020-04-30 NOTE — Progress Notes (Signed)
   DANNISHA ECKMANN 12-15-1945 [id number]  SUBJECTIVE:  74 y.o. G2P0011 female presents for pessary follow-up.  She has had a previous hysterectomy about 40 years ago and has had development of significant vaginal vault prolapse and cystocele.  She was fitted with a size 4 ring pessary with support last month, unfortunately, this only stayed in place for about a day and was causing a lot of cramping and then did eventually slide out of the vaginal cavity and she has left it out since.  At her initial fitting 03/03/2020 multiple sizes and shapes of pessaries were tried including the Gellhorn, the smallest was not staying in place and the next size up she did not tolerate placement.   Allergies: Penicillins and Sulfur  No LMP recorded. Patient has had a hysterectomy.  Past medical history,surgical history, problem list, medications, allergies, family history and social history were all reviewed and documented as reviewed in the EPIC chart.  OBJECTIVE:  BP 122/80  The patient appears well, alert, oriented x 3, in no distress. PELVIC EXAM: Deferred  ASSESSMENT:  74 y.o. G2P0011 here for management of post hysterectomy vaginal vault prolapse and cystocele  PLAN:  I offered her a trial of another pessary, referral for floor physical therapy, or referral to urogynecology for further management recommendations including possible surgical intervention.  She indicates that the problem is not bothering her enough to warrant surgical interventions right now but she will think about the PT referral and let us know if she would like to move forward with that.  She is welcome to just call us if she decides she wants to go forward with the PT referral.   [name] MD 04/30/20

## 2020-05-01 ENCOUNTER — Other Ambulatory Visit: Payer: Self-pay | Admitting: Family Medicine

## 2020-05-01 DIAGNOSIS — Z1231 Encounter for screening mammogram for malignant neoplasm of breast: Secondary | ICD-10-CM

## 2020-05-08 ENCOUNTER — Ambulatory Visit
Admission: RE | Admit: 2020-05-08 | Discharge: 2020-05-08 | Disposition: A | Discharge status: Home / Self Care | Payer: Medicare Other | Source: Ambulatory Visit | Attending: Family Medicine | Admitting: Family Medicine

## 2020-05-08 ENCOUNTER — Other Ambulatory Visit: Payer: Self-pay

## 2020-05-08 DIAGNOSIS — Z1231 Encounter for screening mammogram for malignant neoplasm of breast: Secondary | ICD-10-CM

## 2020-05-26 ENCOUNTER — Telehealth: Payer: Self-pay | Admitting: Family Medicine

## 2020-05-26 NOTE — Telephone Encounter (Signed)
Left message for patient to schedule Annual Wellness Visit.  Please schedule with Nurse Health Advisor Shannon Crews, RN at [facility]  

## 2020-06-23 ENCOUNTER — Telehealth: Payer: Self-pay | Admitting: Family Medicine

## 2020-06-23 NOTE — Telephone Encounter (Signed)
Please advise 

## 2020-06-23 NOTE — Telephone Encounter (Signed)
Pt called to say her dog died Mar 11, 2023 morning of Leptospirosis and that she was advise to get tested for the bacteria.  Please call Pt

## 2020-06-25 ENCOUNTER — Other Ambulatory Visit: Payer: Medicare Other

## 2020-06-25 ENCOUNTER — Other Ambulatory Visit: Payer: Self-pay | Admitting: Family Medicine

## 2020-06-25 ENCOUNTER — Other Ambulatory Visit: Payer: Self-pay

## 2020-06-25 DIAGNOSIS — Z209 Contact with and (suspected) exposure to unspecified communicable disease: Secondary | ICD-10-CM

## 2020-06-25 NOTE — Telephone Encounter (Signed)
Spoke with pt, lab appt placed for today at 4:20pm.  Pt has poison ivy, wanted to know if you had a cream you could send to the pharmacy for her to use for the itchiness?

## 2020-06-25 NOTE — Telephone Encounter (Signed)
She can try over-the-counter hydrocortisone 1% 3 times daily as needed + Zyrtec 10 mg twice daily for 7 days. Thanks, BJ

## 2020-06-25 NOTE — Telephone Encounter (Signed)
Order placed. Thanks, BJ

## 2020-06-25 NOTE — Telephone Encounter (Signed)
I spoke with patient, she is aware of information below. Will call back/follow up if she doesn't improve.

## 2020-07-02 LAB — LEPTOSPIRA DNA,QUALITATIVE, REAL TIME PCR: Leptospira DNA,QL: NOT DETECTED

## 2020-07-06 DIAGNOSIS — Z23 Encounter for immunization: Secondary | ICD-10-CM | POA: Diagnosis not present

## 2020-07-31 ENCOUNTER — Other Ambulatory Visit: Payer: Self-pay | Admitting: Family Medicine

## 2020-07-31 DIAGNOSIS — E785 Hyperlipidemia, unspecified: Secondary | ICD-10-CM

## 2020-08-06 ENCOUNTER — Encounter: Payer: Self-pay | Admitting: Family Medicine

## 2020-08-06 ENCOUNTER — Ambulatory Visit (INDEPENDENT_AMBULATORY_CARE_PROVIDER_SITE_OTHER)
Admission: RE | Admit: 2020-08-06 | Discharge: 2020-08-06 | Disposition: A | Discharge status: Home / Self Care | Payer: Medicare Other | Source: Ambulatory Visit | Attending: Family Medicine | Admitting: Family Medicine

## 2020-08-06 ENCOUNTER — Ambulatory Visit (INDEPENDENT_AMBULATORY_CARE_PROVIDER_SITE_OTHER): Payer: Medicare Other | Admitting: Family Medicine

## 2020-08-06 ENCOUNTER — Other Ambulatory Visit: Payer: Self-pay

## 2020-08-06 VITALS — BP 122/80 | HR 82 | Resp 16 | Ht <= 58 in | Wt 143.1 lb

## 2020-08-06 DIAGNOSIS — R0781 Pleurodynia: Secondary | ICD-10-CM | POA: Diagnosis not present

## 2020-08-06 DIAGNOSIS — I10 Essential (primary) hypertension: Secondary | ICD-10-CM | POA: Diagnosis not present

## 2020-08-06 DIAGNOSIS — M25511 Pain in right shoulder: Secondary | ICD-10-CM

## 2020-08-06 DIAGNOSIS — F330 Major depressive disorder, recurrent, mild: Secondary | ICD-10-CM | POA: Diagnosis not present

## 2020-08-06 DIAGNOSIS — R251 Tremor, unspecified: Secondary | ICD-10-CM | POA: Diagnosis not present

## 2020-08-06 DIAGNOSIS — R42 Dizziness and giddiness: Secondary | ICD-10-CM | POA: Diagnosis not present

## 2020-08-06 DIAGNOSIS — Z0000 Encounter for general adult medical examination without abnormal findings: Secondary | ICD-10-CM | POA: Diagnosis not present

## 2020-08-06 DIAGNOSIS — G894 Chronic pain syndrome: Secondary | ICD-10-CM | POA: Diagnosis not present

## 2020-08-06 DIAGNOSIS — Z1211 Encounter for screening for malignant neoplasm of colon: Secondary | ICD-10-CM

## 2020-08-06 DIAGNOSIS — M159 Polyosteoarthritis, unspecified: Secondary | ICD-10-CM | POA: Diagnosis not present

## 2020-08-06 DIAGNOSIS — W19XXXA Unspecified fall, initial encounter: Secondary | ICD-10-CM | POA: Diagnosis not present

## 2020-08-06 NOTE — Patient Instructions (Addendum)
  Tabitha Adams , Thank you for taking time to come for your Medicare Wellness Visit. I appreciate your ongoing commitment to your health goals. Please review the following plan we discussed and let me know if I can assist you in the future.   These are the goals we discussed: Goals    . Patient Stated     Improve joint pain, reduce sugar intake. Continue staying active, fixing up house!       This is a list of the screening recommended for you and due dates:  Health Maintenance  Topic Date Due  . Pneumonia vaccines (2 of 2 - PPSV23) 01/22/2020  . Flu Shot  06/15/2020  . Colon Cancer Screening  11/13/2021*  . Mammogram  05/08/2022  . Tetanus Vaccine  11/15/2025  . DEXA scan (bone density measurement)  Completed  . COVID-19 Vaccine  Completed  .  Hepatitis C: One time screening is recommended by Center for Disease Control  (CDC) for  adults born from [birth year] through 1965.   Completed  *Topic was postponed. The date shown is not the original due date.   A few things to remember from today's visit:   Essential hypertension, benign - Plan: COMPLETE METABOLIC PANEL WITH GFR  Chronic pain disorder  Colon cancer screening - Plan: Ambulatory referral to Gastroenterology  Costal margin pain - Plan: DG Ribs Unilateral W/Chest Right  Acute pain of right shoulder - Plan: DG Shoulder Right  Fall, initial encounter - Plan: DG Ribs Unilateral W/Chest Right, DG Shoulder Right  If you need refills please call your pharmacy. Do not use My Chart to request refills or for acute issues that need immediate attention.    Please be sure medication list is accurate. If a new problem present, please set up appointment sooner than planned today. A few tips:  -As we age balance is not as good as it was, so there is a higher risks for falls. Please remove small rugs and furniture that is "in your way" and could increase the risk of falls. Stretching exercises may help with fall prevention: Yoga and  Tai Chi are some examples. Low impact exercise is better, so you are not very achy the next day.  -Sun screen and avoidance of direct sun light recommended. Caution with dehydration, if working outdoors be sure to drink enough fluids.  - Some medications are not safe as we age, increases the risk of side effects and can potentially interact with other medication you are also taken;  including some of over the counter medications. Be sure to let me know when you start a new medication even if it is a dietary/vitamin supplement.   -Healthy diet low in red meet/animal fat and sugar + regular physical activity is recommended.

## 2020-08-06 NOTE — Assessment & Plan Note (Signed)
BP adequately controlled. No changes in current management. Continue low-salt diet. Eye exam is current. 

## 2020-08-06 NOTE — Assessment & Plan Note (Signed)
Problem is stable. Continue Celexa 10 mg daily.

## 2020-08-06 NOTE — Assessment & Plan Note (Signed)
PDMP reviewed, last Tramadol refill on 07/05/20. Last med contract in 01/2019,will plan on signing new one next visit. She understand current guideline for chronic opioid/controlled medication use for chronic pain.

## 2020-08-06 NOTE — Assessment & Plan Note (Signed)
Some of joint pain aggravated by recent fall. No changes in current management. Some side effects of Tramadol and Mobic discussed.

## 2020-08-06 NOTE — Progress Notes (Signed)
HPI: [name] is a 74 y.o. female, who is here today for 6 months follow up.  She is also due for AWV, last one done 01/22/19.  She was last seen on 02/04/20.  She lives alone. Sons and grandchildren check on her periodically.  Independent ADL's and IADL's. She does not use assistance for transfer. She exercises regularly, stretching and 5 Lb wts. In general she follows a healthful diet.  Functional Status Survey: Is the patient deaf or have difficulty hearing?: No Does the patient have difficulty seeing, even when wearing glasses/contacts?: No Does the patient have difficulty concentrating, remembering, or making decisions?: No (Occasionally she forgets names.) Does the patient have difficulty walking or climbing stairs?: Yes Does the patient have difficulty dressing or bathing?: No Does the patient have difficulty doing errands alone such as visiting a doctor's office or shopping?: No  Fall Risk  08/06/2020 08/09/2019 01/22/2019 10/03/2018 06/14/2018  Falls in the past year? 1 0 0 1 Yes  Comment - - - Emmi Telephone Survey: data to providers prior to load Emmi Telephone Survey: data to providers prior to load  Number falls in past yr: 0 0 - 1 1  Comment - - - Emmi Telephone Survey Actual Response = 4 Emmi Telephone Survey Actual Response = 1  Injury with Fall? 0 0 - 0 Yes  Risk for fall due to : Orthopedic patient;Medication side effect Orthopedic patient History of fall(s);Impaired balance/gait - -  Follow up Education provided Education provided Education provided;Falls prevention discussed - -   Immunization History  Administered Date(s) Administered  . Fluad Quad(high Dose 65+) 08/06/2019  . Influenza, High Dose Seasonal PF 08/31/2016, 09/07/2017, 07/24/2018  . PFIZER SARS-COV-2 Vaccination 01/05/2020, 01/30/2020  . Pneumococcal Conjugate-13 01/22/2019  . Tdap 11/16/2015   Providers she sees regularly: Eye care provider: Dr Tabitha Adams Gyn: Dr  Tabitha Adams  Depression screen [facility] 2/9 08/06/2020  Decreased Interest 0  Down, Depressed, Hopeless 0  PHQ - 2 Score 0  Altered sleeping 1  Tired, decreased energy 1  Change in appetite 0  Feeling bad or failure about yourself  0  Trouble concentrating 0  Moving slowly or fidgety/restless 0  Suicidal thoughts 0  PHQ-9 Score 2  Difficult doing work/chores Not difficult at all     Hearing Screening   125Hz  250Hz  500Hz  1000Hz  2000Hz  3000Hz  4000Hz  6000Hz  8000Hz   Right ear:           Left ear:             Visual Acuity Screening   Right eye Left eye Both eyes  Without correction: 20/25 20/25 20/25   With correction:       Yesterday she fell at work. Around 11:30 Am, she was walking and slid. She landed on right side.  Hit right temple. Negative for LOC. EMS evaluation,stable VS, did not need to go to the ER.  After fall she started with right shoulder. thoracic pain,and hip pain. No deformities or joints edema. Lateral right chest pain exacerbated by deep breathing. Negative for cough,wheezing,or SOB. Mild temporal headache.  Generalized OA: She is on Tramadol 50 mg bid and Meloxicam 15 mg daily prn. IP joint pain hands and feet,lower back,and knees. Medication does help. Pain can be severe. Exacerbated by repetitive movement.  Tolerating medication well.  HTN: Negative for visual changes, chest pain, dyspnea, palpitation, claudication,or focal weakness. She is on Losartan 50 mg daily.  Lab Results  Component Value Date   CREATININE 0.83  02/04/2020   BUN 20 02/04/2020   NA 134 (L) 02/04/2020   K 4.2 02/04/2020   CL 100 02/04/2020   CO2 27 02/04/2020   Intermittent episodes of dizziness, spinning sensation and lasts a few days. She is taking Meclizine 25 mg at night if needed. Evaluated by ENT. Hand tremor, seems to be getting worse, exacerbated with certain activities.  She is on Celexa that was resumed because recurrent depression. Symptoms are well  controlled.  HLD: She is on Pravastatin 20 mg daily.  Review of Systems  Constitutional: Positive for fatigue. Negative for activity change, appetite change and fever.  HENT: Negative for mouth sores, nosebleeds and sore throat.   Eyes: Negative for redness and visual disturbance.  Respiratory: Negative for cough and wheezing.   Gastrointestinal: Negative for abdominal pain, nausea and vomiting.       Negative for changes in bowel habits.  Genitourinary: Negative for decreased urine volume, dysuria and hematuria.  Musculoskeletal: Positive for arthralgias and back pain.  Skin: Negative for wound.  Allergic/Immunologic: Positive for environmental allergies.  Neurological: Negative for syncope, facial asymmetry and weakness.  Psychiatric/Behavioral: Negative for confusion. The patient is not nervous/anxious.   Rest of ROS, see pertinent positives sand negatives in HPI  Current Outpatient Medications on File Prior to Visit  Medication Sig Dispense Refill  . alendronate (FOSAMAX) 70 MG tablet Take 1 tablet (70 mg total) by mouth every 7 (seven) days. Take with a full glass of water on an empty stomach. 13 tablet 2  . b complex vitamins capsule Take 1 capsule by mouth daily.    . Calcium-Phosphorus-Vitamin D (CALCIUM GUMMIES) [phone] MG-MG-UNIT CHEW Chew by mouth.    . cetirizine (ZYRTEC) 10 MG tablet Take 10 mg by mouth at bedtime.    . Cholecalciferol (VITAMIN D3) 50 MCG (2000 UT) TABS Take by mouth daily.    . ferrous sulfate 325 (65 FE) MG tablet Take 325 mg by mouth every Monday, Wednesday, and Friday.    . fluticasone (FLONASE) 50 MCG/ACT nasal spray Place 1 spray into both nostrils 2 (two) times daily as needed for allergies or rhinitis. 16 g 11  . glucosamine-chondroitin 500-400 MG tablet Take 1 tablet by mouth 2 (two) times daily.    Marland Kitchen losartan (COZAAR) 50 MG tablet TAKE 1 TABLET BY MOUTH  DAILY 90 tablet 3  . meclizine (ANTIVERT) 25 MG tablet Take 0.5-1 tablets (12.5-25 mg  total) by mouth 2 (two) times daily as needed for dizziness. 45 tablet 1  . meloxicam (MOBIC) 15 MG tablet TAKE 1 TABLET BY MOUTH  DAILY 90 tablet 3  . Misc Natural Products (SUPER ENERGY HERBAL COMPLEX PO) Take by mouth.    . montelukast (SINGULAIR) 10 MG tablet TAKE 1 TABLET BY MOUTH AT  BEDTIME 90 tablet 3  . Multiple Vitamin (MULTIVITAMIN WITH MINERALS) TABS tablet Take 1 tablet by mouth daily.    Marland Kitchen omeprazole (PRILOSEC) 20 MG capsule TAKE 1 CAPSULE BY MOUTH  DAILY 90 capsule 3  . Potassium Gluconate 595 MG CAPS Take by mouth.    . pravastatin (PRAVACHOL) 20 MG tablet TAKE 1 TABLET BY MOUTH  DAILY 90 tablet 3  . traMADol (ULTRAM) 50 MG tablet TAKE 1 TABLET(50 MG) BY MOUTH bid prn 60 tablet 3  . vitamin C (VITAMIN C) 1000 MG tablet Take 1 tablet (1,000 mg total) by mouth daily. 30 tablet 0  . citalopram (CELEXA) 10 MG tablet Take 0.5 tablets (5 mg total) by mouth daily. Weaning medication.  30 tablet 0   No current facility-administered medications on file prior to visit.   Past Medical History:  Diagnosis Date  . Allergy   . Anemia   . Arthritis   . GERD (gastroesophageal reflux disease)   . Hyperlipidemia    Allergies  Allergen Reactions  . Penicillins Rash and Other (See Comments)    Has patient had a PCN reaction causing immediate rash, facial/tongue/throat swelling, SOB or lightheadedness with hypotension: No Has patient had a PCN reaction causing severe rash involving mucus membranes or skin necrosis: No Has patient had a PCN reaction that required hospitalization No Has patient had a PCN reaction occurring within the last 10 years: No If all of the above answers are "NO", then may proceed with Cephalosporin use.  . Sulfur Swelling and Other (See Comments)    Reaction:  All over body swelling     Social History   Socioeconomic History  . Marital status: Widowed    Spouse name: Not on file  . Number of children: 3  . Years of education: Not on file  . Highest education  level: Not on file  Occupational History  . Occupation: Surveyor, quantity: [employer]    Comment: works 25-30hrs/week  Tobacco Use  . Smoking status: Former Research scientist (life sciences)  . Smokeless tobacco: Never Used  Vaping Use  . Vaping Use: Never used  Substance and Sexual Activity  . Alcohol use: No  . Drug use: No  . Sexual activity: Not Currently    Comment: 1st intercourse 74 yo-Fewer than 5 partners  Other Topics Concern  . Not on file  Social History Narrative   01/22/2019:   Widowed since 10/2017; lives alone now [place] house with 2 dogs   Has one son, and one stepson and Psychiatrist. Sons live close by, supportive.   Has 5 grandchildren and several great grandchildren, active in their lives   Continues to work PT as Scientist, water quality, likes to stay busy   [employer] community support system   Social Determinants of Radio broadcast assistant Strain:   . Difficulty of Paying Living Expenses: Not on file  Food Insecurity:   . Worried About Charity fundraiser in the Last Year: Not on file  . Ran Out of Food in the Last Year: Not on file  Transportation Needs:   . Lack of Transportation (Medical): Not on file  . Lack of Transportation (Non-Medical): Not on file  Physical Activity:   . Days of Exercise per Week: Not on file  . Minutes of Exercise per Session: Not on file  Stress:   . Feeling of Stress : Not on file  Social Connections:   . Frequency of Communication with Friends and Family: Not on file  . Frequency of Social Gatherings with Friends and Family: Not on file  . Attends Religious Services: Not on file  . Active Member of Clubs or Organizations: Not on file  . Attends Archivist Meetings: Not on file  . Marital Status: Not on file    Vitals:   08/06/20 1449  BP: 122/80  Pulse: 82  Resp: 16  SpO2: 96%   Body mass index is 29.91 kg/m.  Physical Exam Vitals and nursing note reviewed.  Constitutional:      General: She is not in acute distress.     Appearance: She is well-developed.  HENT:     Head: Normocephalic and atraumatic.     Mouth/Throat:  Mouth: Mucous membranes are moist.     Pharynx: Oropharynx is clear.  Eyes:     Extraocular Movements: Extraocular movements intact.     Conjunctiva/sclera: Conjunctivae normal.     Pupils: Pupils are equal, round, and reactive to light.  Cardiovascular:     Rate and Rhythm: Normal rate and regular rhythm.     Heart sounds: No murmur heard.   Pulmonary:     Effort: Pulmonary effort is normal. No respiratory distress.     Breath sounds: Normal breath sounds.  Chest:     Chest wall: Tenderness present. No edema.       Comments: Tenderness upon palpation of area under right axilla, no deformity. Abdominal:     Palpations: Abdomen is soft. There is no hepatomegaly or mass.     Tenderness: There is no abdominal tenderness.  Musculoskeletal:     Right shoulder: Tenderness present. No effusion or bony tenderness. Normal range of motion.     Comments: Kyphosis. Lower back pain with movement on examination table. No signs of synovitis.  Lymphadenopathy:     Cervical: No cervical adenopathy.  Skin:    General: Skin is warm.     Findings: Ecchymosis present. No erythema or rash.     Comments: Periocular ecchymosis. Right temple ecchymosis and mild edema, tender.  Neurological:     General: No focal deficit present.     Mental Status: She is alert and oriented to person, place, and time.     Cranial Nerves: No cranial nerve deficit.  Psychiatric:     Comments: Well groomed, good eye contact.   ASSESSMENT AND PLAN:  [name] was seen today for AWV and 6 months follow-up.  Orders Placed This Encounter  Procedures  . DG Ribs Unilateral W/Chest Right  . DG Shoulder Right  . COMPLETE METABOLIC PANEL WITH GFR  . Ambulatory referral to Gastroenterology  . Ambulatory referral to Neurology   Lab Results  Component Value Date   CREATININE 0.72 08/06/2020   BUN  13 08/06/2020   NA 137 08/06/2020   K 4.1 08/06/2020   CL 103 08/06/2020   CO2 29 08/06/2020   Lab Results  Component Value Date   ALT 20 08/06/2020   AST 23 08/06/2020   ALKPHOS 78 08/06/2019   BILITOT 0.4 08/06/2020    Medicare annual wellness visit, subsequent We discussed the importance of staying active, physically and mentally, as well as the benefits of a healthy/balnace diet. Low impact exercise that involve stretching and strengthing are ideal. Vaccines up to date. We discussed preventive screening for the next 5-10 years, summery of recommendations given in AVS. Due for colonoscopy. Fall prevention. Mammogram 05/08/20. DEXA: 06/05/19 osteopenia. FRAX score for hip fracture 5.7%., she is on Fosamax 70 mh weekly.  Advance directives and end of life discussed, she has POA and living will.   Colon cancer screening  Ambulatory referral to Gastroenterology  Costal margin pain Recommending avoiding shallow breathing. Rib X ray to evaluate for rib fracture.  Acute pain of right shoulder ROM exercises recommended. Further recommendations according to X ray results.  Fall, initial encounter Fall precautions discussed. Some of her chronic medical problem and meds can increased risk.  Dizziness Hx suggest positional vertigo but it has been persistent. Neuro evaluation will be arranged.  Tremor Most likely essential tremor, getting worse. Other possible etiologies discussed.  Generalized osteoarthritis of multiple sites Some of joint pain aggravated by recent fall. No changes in current management. Some  side effects of Tramadol and Mobic discussed.  Essential hypertension, benign BP adequately controlled. No changes in current management. Continue low salt diet. Eye exam is current.  Chronic pain disorder PDMP reviewed, last Tramadol refill on 07/05/20. Last med contract in 01/2019,will plan on signing new one next visit. She understand current guideline for  chronic opioid/controlled medication use for chronic pain.  Mild recurrent major depression (HCC) Problem is stable. Continue Celexa 10 mg daily.   Return in about 6 months (around 02/03/2021).  Renso Swett G. [name], MD  [facility]. [place] office.   [name] , Thank you for taking time to come for your Medicare Wellness Visit. I appreciate your ongoing commitment to your health goals. Please review the following plan we discussed and let me know if I can assist you in the future.   These are the goals we discussed: Goals    . Patient Stated     Improve joint pain, reduce sugar intake. Continue staying active, fixing up house!       This is a list of the screening recommended for you and due dates:  Health Maintenance  Topic Date Due  . Pneumonia vaccines (2 of 2 - PPSV23) 01/22/2020  . Flu Shot  06/15/2020  . Colon Cancer Screening  11/13/2021*  . Mammogram  05/08/2022  . Tetanus Vaccine  11/15/2025  . DEXA scan (bone density measurement)  Completed  . COVID-19 Vaccine  Completed  .  Hepatitis C: One time screening is recommended by Center for Disease Control  (CDC) for  adults born from [birth year] through 1965.   Completed  *Topic was postponed. The date shown is not the original due date.   A few things to remember from today's visit:   Essential hypertension, benign - Plan: COMPLETE METABOLIC PANEL WITH GFR  Chronic pain disorder  Colon cancer screening - Plan: Ambulatory referral to Gastroenterology  Costal margin pain - Plan: DG Ribs Unilateral W/Chest Right  Acute pain of right shoulder - Plan: DG Shoulder Right  Fall, initial encounter - Plan: DG Ribs Unilateral W/Chest Right, DG Shoulder Right  If you need refills please call your pharmacy. Do not use My Chart to request refills or for acute issues that need immediate attention.    Please be sure medication list is accurate. If a new problem present, please set up appointment sooner than planned  today. A few tips:  -As we age balance is not as good as it was, so there is a higher risks for falls. Please remove small rugs and furniture that is "in your way" and could increase the risk of falls. Stretching exercises may help with fall prevention: Yoga and Tai Chi are some examples. Low impact exercise is better, so you are not very achy the next day.  -Sun screen and avoidance of direct sun light recommended. Caution with dehydration, if working outdoors be sure to drink enough fluids.  - Some medications are not safe as we age, increases the risk of side effects and can potentially interact with other medication you are also taken;  including some of over the counter medications. Be sure to let me know when you start a new medication even if it is a dietary/vitamin supplement.   -Healthy diet low in red meet/animal fat and sugar + regular physical activity is recommended.

## 2020-08-07 ENCOUNTER — Encounter: Payer: Self-pay | Admitting: Neurology

## 2020-08-07 LAB — COMPLETE METABOLIC PANEL WITH GFR
AG Ratio: 2.2 (calc) (ref 1.0–2.5)
ALT: 20 U/L (ref 6–29)
AST: 23 U/L (ref 10–35)
Albumin: 4.3 g/dL (ref 3.6–5.1)
Alkaline phosphatase (APISO): 66 U/L (ref 37–153)
BUN: 13 mg/dL (ref 7–25)
CO2: 29 mmol/L (ref 20–32)
Calcium: 9.3 mg/dL (ref 8.6–10.4)
Chloride: 103 mmol/L (ref 98–110)
Creat: 0.72 mg/dL (ref 0.60–0.93)
GFR, Est African American: 96 mL/min/{1.73_m2} (ref >=60)
GFR, Est Non African American: 82 mL/min/{1.73_m2} (ref >=60)
Globulin: 2 g/dL (calc) (ref 1.9–3.7)
Glucose, Bld: 115 mg/dL — ABNORMAL HIGH (ref 65–99)
Potassium: 4.1 mmol/L (ref 3.5–5.3)
Sodium: 137 mmol/L (ref 135–146)
Total Bilirubin: 0.4 mg/dL (ref 0.2–1.2)
Total Protein: 6.3 g/dL (ref 6.1–8.1)

## 2020-08-25 NOTE — Progress Notes (Signed)
Assessment/Plan:   1.  Essential Tremor.  -This is evidenced by the symmetrical nature and longstanding hx of gradually getting worse.  We discussed nature and pathophysiology.  We discussed that this can continue to gradually get worse with time.  We discussed that some medications can worsen this, as can caffeine use.  We discussed medication therapy as well as surgical therapy.  Ultimately, the patient decided to hold off on any tx.    2.  Vertigo, likely BPPV  -Patient has had symptoms for 10 years on an intermittent basis.  Her neuro exam is nonfocal and nonlateralizing.  It is unlikely that this is a primary neurologic issue.  -Pt actually didn't even mention this to me, but I did to her as it was in referral.  Currently asymptommatic.  3.  Recent fall, with head trauma  -pt thinks that lid lag/ptosis/pseudoptosis on the R worse since the fall.  We will order CT brain and orbits  4.  If above neg, f/u prn    Subjective:   Tabitha Adams was seen in consultation in the movement disorder clinic at the request of [name], Malka So, MD.  The evaluation is for tremor and vertigo.  Tremor started in childhoold but has been worse over the last few year.  It involves the bilateral UE.  Tremor is most noticeable when fine motor coordination is needed.   There is a family hx of tremor in her mother/son/niece/nephew  Affected by caffeine:  Doesn't drink enough to know Affected by alcohol: doesn't drink any Affected by stress:  Yes.   Affected by fatigue:  No. Spills soup if on spoon:  No. Spills glass of liquid if full:  No. Affects ADL's (tying shoes, brushing teeth, etc):  No.  Current/Previously tried tremor medications: n/a  Current medications that may exacerbate tremor:  singulair  Outside reports reviewed: historical medical records, lab reports and referral letter/letters.  Last neuroimaging was in 2018, at which time she had CT brain.  There was atrophy and moderate small  vessel disease.   She does report that she fell 4 weeks ago in the parking lot at work and hit the head.  That was unusual for her.  She caught her foot on the ground.  Works at Advertising copywriter.  Patient also here for vertigo (per records, pt doesn't mention it until I do).  She has had vertigo for 10 years, intermittently.    She takes meclizine on an as-needed basis.  She apparently has been evaluated by ENT in the past.  Currently without sx's  Allergies  Allergen Reactions  . Cymbalta [Duloxetine Hcl] Nausea Only    Adverse reaction caused dizziness and nausea  . Penicillins Rash and Other (See Comments)    Has patient had a PCN reaction causing immediate rash, facial/tongue/throat swelling, SOB or lightheadedness with hypotension: No Has patient had a PCN reaction causing severe rash involving mucus membranes or skin necrosis: No Has patient had a PCN reaction that required hospitalization No Has patient had a PCN reaction occurring within the last 10 years: No If all of the above answers are "NO", then may proceed with Cephalosporin use.  . Sulfur Swelling and Other (See Comments)    Reaction:  All over body swelling     Current Outpatient Medications  Medication Instructions  . Apoaequorin (PREVAGEN EXTRA STRENGTH) 20 MG CAPS 1 tablet, Oral, Daily  . ascorbic acid (VITAMIN C) 1,000 mg, Oral, Daily  . b complex vitamins capsule 1  capsule, Oral, Daily  . Calcium-Phosphorus-Vitamin D (CALCIUM GUMMIES) 250-100-500 MG-MG-UNIT CHEW Oral  . cetirizine (ZYRTEC) 10 mg, Oral, Daily at bedtime  . Cholecalciferol (VITAMIN D3) 50 MCG (2000 UT) TABS Oral, Daily  . citalopram (CELEXA) 5 mg, Oral, Daily, Weaning medication.  . fluticasone (FLONASE) 50 MCG/ACT nasal spray 1 spray, Each Nare, 2 times daily PRN  . glucosamine-chondroitin 500-400 MG tablet 1 tablet, Oral, 2 times daily  . losartan (COZAAR) 50 MG tablet TAKE 1 TABLET BY MOUTH  DAILY  . meclizine (ANTIVERT) 12.5-25 mg, Oral, 2 times daily  PRN  . meloxicam (MOBIC) 15 MG tablet TAKE 1 TABLET BY MOUTH  DAILY  . Misc Natural Products (SUPER ENERGY HERBAL COMPLEX PO) Oral  . montelukast (SINGULAIR) 10 MG tablet TAKE 1 TABLET BY MOUTH AT  BEDTIME  . Multiple Vitamin (MULTIVITAMIN WITH MINERALS) TABS tablet 1 tablet, Oral, Daily  . omeprazole (PRILOSEC) 20 MG capsule TAKE 1 CAPSULE BY MOUTH  DAILY  . polyethylene glycol (MIRALAX / GLYCOLAX) 17 g, Oral, Daily  . Potassium Gluconate 595 MG CAPS Oral  . pravastatin (PRAVACHOL) 20 MG tablet TAKE 1 TABLET BY MOUTH  DAILY  . traMADol (ULTRAM) 50 MG tablet TAKE 1 TABLET(50 MG) BY MOUTH bid prn  . Vitamin D (Ergocalciferol) (DRISDOL) 50,000 Units, Oral, Every 7 days     Objective:   VITALS:   Vitals:   09/01/20 1436  BP: 130/65  Pulse: 95  SpO2: 95%  Weight: 142 lb (64.4 kg)  Height: 4\' 11"  (1.499 m)   Gen:  Appears stated age and in NAD. HEENT:  Normocephalic, atraumatic. The mucous membranes are moist. The superficial temporal arteries are without ropiness or tenderness. Cardiovascular: Regular rate and rhythm. Lungs: Clear to auscultation bilaterally. Neck: There are no carotid bruits noted bilaterally.  NEUROLOGICAL:  Orientation:  The patient is alert and oriented x 3.   Cranial nerves: There is good facial symmetry with the exception of the fact that the R eye is smaller than the L (patient thinks it got worse after fall few weeks ago but that way before fall). Extraocular muscles are intact and visual fields are full to confrontational testing. Speech is fluent and clear. Soft palate rises symmetrically and there is no tongue deviation. Hearing is intact to conversational tone. Tone: Tone is good throughout. Sensation: Sensation is intact to light touch touch throughout (facial, trunk, extremities). Vibration is intact at the bilateral big toe. There is no extinction with double simultaneous stimulation. There is no sensory dermatomal level identified. Coordination:  The  patient has no dysdiadichokinesia or dysmetria. Motor: Strength is 5/5 in the bilateral upper and lower extremities.  Shoulder shrug is equal bilaterally.  There is no pronator drift.  There are no fasciculations noted. DTR's: Deep tendon reflexes are 2+-3/4 at the bilateral biceps, triceps, brachioradialis, patella and achilles.  Plantar responses are downgoing bilaterally. Gait and Station: The patient is able to ambulate without difficulty. The patient is able to heel toe walk without any difficulty. The patient is able to ambulate in a tandem fashion. The patient is able to stand in the Romberg position.   MOVEMENT EXAM: Tremor:  There is tremor in the UE, noted most significantly with action, left greater than right.  The patient has trouble with Archimedes spirals, mostly on the left.    There is no tremor at rest.    I have reviewed and interpreted the following labs independently   Chemistry      Component Value Date/Time  NA 137 08/06/2020 1534   NA 140 06/11/2016 0000   K 4.1 08/06/2020 1534   CL 103 08/06/2020 1534   CO2 29 08/06/2020 1534   BUN 13 08/06/2020 1534   BUN 19 06/11/2016 0000   CREATININE 0.72 08/06/2020 1534   GLU 101 06/11/2016 0000      Component Value Date/Time   CALCIUM 9.3 08/06/2020 1534   ALKPHOS 78 08/06/2019 0747   AST 23 08/06/2020 1534   ALT 20 08/06/2020 1534   BILITOT 0.4 08/06/2020 1534      Lab Results  Component Value Date   WBC 5.2 02/01/2018   HGB 11.7 (L) 02/01/2018   HCT 33.8 (L) 02/01/2018   MCV 84.0 02/01/2018   PLT 293.0 02/01/2018   Lab Results  Component Value Date   TSH 0.86 07/24/2018      Total time spent on today's visit was 45 minutes, including both face-to-face time and nonface-to-face time.  Time included that spent on review of records (prior notes available to me/labs/imaging if pertinent), discussing treatment and goals, answering patient's questions and coordinating care.  CC:  [name], [name], MD

## 2020-08-31 ENCOUNTER — Other Ambulatory Visit: Payer: Self-pay | Admitting: Family Medicine

## 2020-08-31 DIAGNOSIS — G894 Chronic pain syndrome: Secondary | ICD-10-CM

## 2020-08-31 DIAGNOSIS — F329 Major depressive disorder, single episode, unspecified: Secondary | ICD-10-CM

## 2020-08-31 DIAGNOSIS — M159 Polyosteoarthritis, unspecified: Secondary | ICD-10-CM

## 2020-09-01 ENCOUNTER — Encounter: Payer: Self-pay | Admitting: Neurology

## 2020-09-01 ENCOUNTER — Ambulatory Visit (INDEPENDENT_AMBULATORY_CARE_PROVIDER_SITE_OTHER): Payer: Medicare Other | Admitting: Neurology

## 2020-09-01 ENCOUNTER — Other Ambulatory Visit: Payer: Self-pay

## 2020-09-01 VITALS — BP 130/65 | HR 95 | Ht 59.0 in | Wt 142.0 lb

## 2020-09-01 DIAGNOSIS — H02401 Unspecified ptosis of right eyelid: Secondary | ICD-10-CM

## 2020-09-01 DIAGNOSIS — S0993XA Unspecified injury of face, initial encounter: Secondary | ICD-10-CM | POA: Diagnosis not present

## 2020-09-01 DIAGNOSIS — R270 Ataxia, unspecified: Secondary | ICD-10-CM

## 2020-09-01 NOTE — Patient Instructions (Signed)
Dr Tat has recommended you have to CT's. Once the test has been approved through your insurance [facility] will contact you to schedule your tests.  Dr Tat recommends you follow up with our office on a as needed basis.   It was a pleasure to meet you today!

## 2020-09-02 NOTE — Telephone Encounter (Signed)
It seems like she has a refill left for Tramadol. Thanks, BJ

## 2020-09-02 NOTE — Telephone Encounter (Signed)
Rx just expired on the 9th of this month.

## 2020-09-05 NOTE — Telephone Encounter (Signed)
Pt is calling in stating that she is out of the Rx's that were called in.  Pharm:  [facility] on [address].  Pt would like to see if they can be called in today she is aware that we ask for 24-72 hrs for refills and the msg is on the providers desktop.

## 2020-09-05 NOTE — Telephone Encounter (Signed)
Patient called after hours requesting her traMADol (ULTRAM) 50 MG tablet and also her citalopram (CELEXA) 10 MG tablet(Expired). She has enough till Saturday.  [id number] [address], [place] AT [address] Phone:  [phone]  Fax:  [phone]

## 2020-09-08 DIAGNOSIS — M5136 Other intervertebral disc degeneration, lumbar region: Secondary | ICD-10-CM | POA: Diagnosis not present

## 2020-09-08 DIAGNOSIS — M9903 Segmental and somatic dysfunction of lumbar region: Secondary | ICD-10-CM | POA: Diagnosis not present

## 2020-09-10 DIAGNOSIS — M5136 Other intervertebral disc degeneration, lumbar region: Secondary | ICD-10-CM | POA: Diagnosis not present

## 2020-09-10 DIAGNOSIS — M9903 Segmental and somatic dysfunction of lumbar region: Secondary | ICD-10-CM | POA: Diagnosis not present

## 2020-09-15 DIAGNOSIS — M5136 Other intervertebral disc degeneration, lumbar region: Secondary | ICD-10-CM | POA: Diagnosis not present

## 2020-09-15 DIAGNOSIS — M9903 Segmental and somatic dysfunction of lumbar region: Secondary | ICD-10-CM | POA: Diagnosis not present

## 2020-09-17 DIAGNOSIS — M9903 Segmental and somatic dysfunction of lumbar region: Secondary | ICD-10-CM | POA: Diagnosis not present

## 2020-09-17 DIAGNOSIS — M5136 Other intervertebral disc degeneration, lumbar region: Secondary | ICD-10-CM | POA: Diagnosis not present

## 2020-09-18 ENCOUNTER — Ambulatory Visit
Admission: RE | Admit: 2020-09-18 | Discharge: 2020-09-18 | Disposition: A | Discharge status: Home / Self Care | Payer: Medicare Other | Source: Ambulatory Visit | Attending: Neurology | Admitting: Neurology

## 2020-09-18 DIAGNOSIS — S0993XA Unspecified injury of face, initial encounter: Secondary | ICD-10-CM | POA: Diagnosis not present

## 2020-09-18 DIAGNOSIS — I6782 Cerebral ischemia: Secondary | ICD-10-CM | POA: Diagnosis not present

## 2020-09-18 DIAGNOSIS — J3489 Other specified disorders of nose and nasal sinuses: Secondary | ICD-10-CM | POA: Diagnosis not present

## 2020-09-18 DIAGNOSIS — H02401 Unspecified ptosis of right eyelid: Secondary | ICD-10-CM | POA: Diagnosis not present

## 2020-09-18 DIAGNOSIS — J320 Chronic maxillary sinusitis: Secondary | ICD-10-CM | POA: Diagnosis not present

## 2020-09-18 DIAGNOSIS — S0990XA Unspecified injury of head, initial encounter: Secondary | ICD-10-CM | POA: Diagnosis not present

## 2020-09-18 DIAGNOSIS — R270 Ataxia, unspecified: Secondary | ICD-10-CM | POA: Diagnosis not present

## 2020-09-22 DIAGNOSIS — M5136 Other intervertebral disc degeneration, lumbar region: Secondary | ICD-10-CM | POA: Diagnosis not present

## 2020-09-22 DIAGNOSIS — M9903 Segmental and somatic dysfunction of lumbar region: Secondary | ICD-10-CM | POA: Diagnosis not present

## 2020-09-25 DIAGNOSIS — M5136 Other intervertebral disc degeneration, lumbar region: Secondary | ICD-10-CM | POA: Diagnosis not present

## 2020-09-25 DIAGNOSIS — M9903 Segmental and somatic dysfunction of lumbar region: Secondary | ICD-10-CM | POA: Diagnosis not present

## 2020-09-29 DIAGNOSIS — M9903 Segmental and somatic dysfunction of lumbar region: Secondary | ICD-10-CM | POA: Diagnosis not present

## 2020-09-29 DIAGNOSIS — M5136 Other intervertebral disc degeneration, lumbar region: Secondary | ICD-10-CM | POA: Diagnosis not present

## 2020-10-02 DIAGNOSIS — M5136 Other intervertebral disc degeneration, lumbar region: Secondary | ICD-10-CM | POA: Diagnosis not present

## 2020-10-02 DIAGNOSIS — M9903 Segmental and somatic dysfunction of lumbar region: Secondary | ICD-10-CM | POA: Diagnosis not present

## 2020-10-07 DIAGNOSIS — M5136 Other intervertebral disc degeneration, lumbar region: Secondary | ICD-10-CM | POA: Diagnosis not present

## 2020-10-07 DIAGNOSIS — M9903 Segmental and somatic dysfunction of lumbar region: Secondary | ICD-10-CM | POA: Diagnosis not present

## 2020-10-21 ENCOUNTER — Other Ambulatory Visit: Payer: Self-pay | Admitting: Family Medicine

## 2020-10-21 DIAGNOSIS — H8110 Benign paroxysmal vertigo, unspecified ear: Secondary | ICD-10-CM

## 2020-10-24 ENCOUNTER — Telehealth: Payer: Self-pay | Admitting: Family Medicine

## 2020-10-24 NOTE — Progress Notes (Signed)
  Chronic Care Management   Outreach Note  10/24/2020 Name: Martesha Niedermeier MRN: [id number] DOB: November 09, 1946  Referred by: [name], [name], MD Reason for referral : No chief complaint on file.   An unsuccessful telephone outreach was attempted today. The patient was referred to the pharmacist for assistance with care management and care coordination.   Follow Up Plan:   Carley Perdue [employer] Scheduler

## 2020-10-27 ENCOUNTER — Telehealth: Payer: Self-pay | Admitting: Family Medicine

## 2020-10-27 NOTE — Progress Notes (Signed)
  Chronic Care Management   Note  10/27/2020 Name: Tabitha Adams MRN: [id number] DOB: 11-Jan-1946  Tabitha Adams is a 74 y.o. year old female who is a primary care patient of [place], Timoteo Expose, MD. I reached out to Tabitha Adams by phone today in response to a referral sent by [name]'s PCP, [place], [name], MD.   Tabitha Adams was given information about Chronic Care Management services today including:  1. CCM service includes personalized support from designated clinical staff supervised by her physician, including individualized plan of care and coordination with other care providers 2. 24/7 contact phone numbers for assistance for urgent and routine care needs. 3. Service will only be billed when office clinical staff spend 20 minutes or more in a month to coordinate care. 4. Only one practitioner may furnish and bill the service in a calendar month. 5. The patient may stop CCM services at any time (effective at the end of the month) by phone call to the office staff.   Patient agreed to services and verbal consent obtained.   Follow up plan:   Tabitha Adams [employer] Scheduler

## 2020-11-03 DIAGNOSIS — Z23 Encounter for immunization: Secondary | ICD-10-CM | POA: Diagnosis not present

## 2020-11-06 ENCOUNTER — Other Ambulatory Visit: Payer: Self-pay | Admitting: Family Medicine

## 2020-11-06 DIAGNOSIS — K219 Gastro-esophageal reflux disease without esophagitis: Secondary | ICD-10-CM

## 2020-11-06 DIAGNOSIS — I10 Essential (primary) hypertension: Secondary | ICD-10-CM

## 2020-11-06 DIAGNOSIS — J309 Allergic rhinitis, unspecified: Secondary | ICD-10-CM

## 2020-12-02 ENCOUNTER — Telehealth: Payer: Self-pay

## 2020-12-02 DIAGNOSIS — I10 Essential (primary) hypertension: Secondary | ICD-10-CM

## 2020-12-02 DIAGNOSIS — K219 Gastro-esophageal reflux disease without esophagitis: Secondary | ICD-10-CM

## 2020-12-02 NOTE — Telephone Encounter (Signed)
-----   Message from Viona Gilmore, [facility] sent at 12/02/2020  1:40 PM EST ----- Regarding: CCM referral Hi,  Can you please place a CCM referral for Ms. Aguayo?  Thank you, Maddie

## 2020-12-03 ENCOUNTER — Ambulatory Visit: Payer: Medicare Other | Admitting: Pharmacist

## 2020-12-03 DIAGNOSIS — K219 Gastro-esophageal reflux disease without esophagitis: Secondary | ICD-10-CM

## 2020-12-03 DIAGNOSIS — I10 Essential (primary) hypertension: Secondary | ICD-10-CM

## 2020-12-03 NOTE — Chronic Care Management (AMB) (Signed)
Chronic Care Management Pharmacy  Name: Tabitha Adams  MRN: [id number] DOB: [birth year]  Initial Planning Appointment: n/a  Initial Questions: 1. Have you seen any other providers since your last visit? n/a 2. Any changes in your medicines or health? No   Chief Complaint/ HPI  Tabitha Adams,  75 y.o. , female presents for their Initial CCM visit with the clinical pharmacist via telephone due to COVID-19 Pandemic.  PCP : [name], [name], MD  Their chronic conditions include: HTN, HLD, allergic rhinitis, anxiety, GERD, osteopenia, pain/OA  Office Visits: -08/06/20 Betty [name], MD: Patient presented for annual visit. Placed referral for GI and neurology.  Consult Visit: -09/01/20 Alonza Bogus, DO (neurology): Patient presented to establish care for tremor and vertigo.  -04/30/20 [name], MD (OBGYN): Patient presented for management of post hysterectomy and cystocele and post pessary insertion.  -02/21/20 Allen Kell (optometry): Patient presented for eye exam. Unable to access notes.  Medications: Outpatient Encounter Medications as of 12/03/2020  Medication Sig  . citalopram (CELEXA) 10 MG tablet TAKE 1/2 TABLET(5 MG) BY MOUTH DAILY  . traMADol (ULTRAM) 50 MG tablet TAKE 1 TABLET(50 MG) BY MOUTH TWICE DAILY AS NEEDED  . Apoaequorin (PREVAGEN EXTRA STRENGTH) 20 MG CAPS Take 1 tablet by mouth daily.  Marland Kitchen b complex vitamins capsule Take 1 capsule by mouth daily.  . Calcium-Phosphorus-Vitamin D (CALCIUM GUMMIES) [phone] MG-MG-UNIT CHEW Chew by mouth.  . cetirizine (ZYRTEC) 10 MG tablet Take 10 mg by mouth at bedtime.  . Cholecalciferol (VITAMIN D3) 50 MCG (2000 UT) TABS Take by mouth daily.  . fluticasone (FLONASE) 50 MCG/ACT nasal spray Place 1 spray into both nostrils 2 (two) times daily as needed for allergies or rhinitis.  Marland Kitchen glucosamine-chondroitin 500-400 MG tablet Take 1 tablet by mouth 2 (two) times daily.  Marland Kitchen losartan (COZAAR) 50 MG tablet TAKE 1  TABLET BY MOUTH  DAILY  . meclizine (ANTIVERT) 25 MG tablet TAKE 1/2 TO 1 TABLET BY  MOUTH TWICE DAILY AS NEEDED FOR DIZZINESS  . meloxicam (MOBIC) 15 MG tablet TAKE 1 TABLET BY MOUTH  DAILY  . Misc Natural Products (SUPER ENERGY HERBAL COMPLEX PO) Take by mouth.  . montelukast (SINGULAIR) 10 MG tablet TAKE 1 TABLET BY MOUTH AT  BEDTIME  . Multiple Vitamin (MULTIVITAMIN WITH MINERALS) TABS tablet Take 1 tablet by mouth daily.  Marland Kitchen omeprazole (PRILOSEC) 20 MG capsule TAKE 1 CAPSULE BY MOUTH  DAILY  . polyethylene glycol (MIRALAX / GLYCOLAX) 17 g packet Take 17 g by mouth daily.  . Potassium Gluconate 595 MG CAPS Take by mouth.  . pravastatin (PRAVACHOL) 20 MG tablet TAKE 1 TABLET BY MOUTH  DAILY  . vitamin C (VITAMIN C) 1000 MG tablet Take 1 tablet (1,000 mg total) by mouth daily.  . Vitamin D, Ergocalciferol, (DRISDOL) 1.25 MG (50000 UNIT) CAPS capsule Take 50,000 Units by mouth every 7 (seven) days.   No facility-administered encounter medications on file as of 12/03/2020.   Patient typically gets up at 4-4:30 am and goes to work at [employer] at [employer] and gets off anywhere from 11-2. 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 She just got a brand new puppy this week who is 61 weeks old. She usually goes to bed around 9:30-10pm.  Patient lives alone but has 2 granddaughters in [place], and a grandson in [place], who all are married with children. She has a son in [place] with 2 daughters,  and a step son in [place]. She sees them once a week or every 2 or 3 weeks and has 5 great grandchildren ranging in ages from 94 to 73 years old.  Patient does all of the cooking and cleaning. Patient hasn't been cooking as much without her husband around. Patient's sister said she doesn't "eat healthy" and typically eats a bagel with cream cheese, snack after work of yogurt, eats a lot of sandwiches in the evening. She doesn't eat veggies much and microwaves  meals at work.   Patient is on her feet at work but doesn't participate in structured exercise. In the summertime, she mows her yard 2 days a week and reports her street isn't really great for taking walks without sidewalks.  Patient reports that she sleeps well and gets about 6-7 hours every night. She denies problems falling asleep or staying asleep and does takes a nap after work.  Patient denies any trouble with her medications or side effects but is not sure what some of her medications are for.  Current Diagnosis/Assessment:  Care Plan : CCM Pharmacy Care Plan  Updates made by Viona Gilmore, [place] since 01/05/2021 12:00 AM    Problem: Problem: HTN, HLD, allergic rhinitis, anxiety, GERD, osteopenia, pain/OA     Long-Range Goal: Patient-Specific Goal   Start Date: 12/03/2020  Expected End Date: 12/03/2021  This Visit's Progress: On track  Priority: High  Note:   Current Barriers:  . Unable to independently monitor therapeutic efficacy  Pharmacist Clinical Goal(s):  Marland Kitchen Over the next 90 days, patient will achieve adherence to monitoring guidelines and medication adherence to achieve therapeutic efficacy through collaboration with PharmD and provider.   Interventions: . 1:1 collaboration with [name], [name], MD regarding development and update of comprehensive plan of care as evidenced by provider attestation and co-signature . Inter-disciplinary care team collaboration (see longitudinal plan of care) . Comprehensive medication review performed; medication list updated in electronic medical record  Hypertension (BP goal <140/90) -controlled -Current treatment: . Losartan 50 mg 1 tablet daily -Medications previously tried: none  -Current home readings: did not provide -Current dietary habits: does not currently add salt to foods but does eat a lot of prepackaged foods -Current exercise habits: no structured exercise but does stand on feet during work hours -Denies  hypotensive/hypertensive symptoms -Educated on Exercise goal of 150 minutes per week; Importance of home blood pressure monitoring; -Counseled to monitor BP at home weekly, document, and provide log at future appointments -Counseled on diet and exercise extensively Recommended to continue current medication  Hyperlipidemia: (LDL goal < 100) -controlled -Current treatment: . Pravastatin 20 mg 1 tablet daily -Medications previously tried: none  -Current dietary patterns: eats a lot of prepackaged foods -Current exercise habits: no structured exercise but does stand on feet during work hours -Educated on Cholesterol goals;  Exercise goal of 150 minutes per week; -Counseled on diet and exercise extensively Recommended to continue current medication  Allergic rhinitis (Goal: minimize symptoms of allergies) -controlled -Current treatment  . Flonase 50 mcg/act nasal spray 1 spray in both nostrils at bedtime . Montelukast 10 mg 1 tablet at bedtime . Cetirizine 10 mg 1 tablet at bedtime -Medications previously tried: none  -Recommended to continue current medication Recommended for patient to discuss with Dr. [name] about stopping montelukast without benefit  Anxiety (Goal: minimize symptoms of anxiety) -controlled -Current treatment: . Citalopram 10 mg 1/2 tablet daily -Medications previously tried/failed: none -PHQ9: 2 -GAD7: 2 -Educated on Benefits of medication for  symptom control -Recommended to continue current medication   GERD (Goal: minimize symptoms of heartburn or acid reflux) -controlled -Current treatment  . Omeprazole 20 mg 1 capsule daily -Medications previously tried: none  -Counseled on long term risks of taking PPIs and rebound acid reflux with tapering Educated on non-pharmacologic management of symptoms such as elevating the head of your bed, avoiding eating 2-3 hours before bed, avoiding triggering foods such as acidic, spicy, or fatty foods, eating smaller  meals, and wearing clothes that are loose around the waist  Osteopenia (Goal improve bone density and prevent fractures) -controlled -Last DEXA Scan:  06/05/2019  T-Score femoral neck: -2.4 (total -1.9)  T-Score total hip: n/a  T-Score lumbar spine: n/a  T-Score forearm radius: -1.3  10-year probability of major osteoporotic fracture: n/a  10-year probability of hip fracture: n/a -Patient is not a candidate for pharmacologic treatment -Current treatment  . Vitamin D 2000 units 1 tablet daily . Calcium 500 mg daily  . Alendronate 70 mg tablet (Sundays) -Medications previously tried: none  -Recommend [phone] units of vitamin D daily. Recommend 1200 mg of calcium daily from dietary and supplemental sources. Counseled on oral bisphosphonate administration: take in the morning, 30 minutes prior to food with 6-8 oz of water. Do not lie down for at least 30 minutes after taking. Recommend weight-bearing and muscle strengthening exercises for building and maintaining bone density. -Recommended to continue current medication  Pain (Goal: minimize symptoms of ppain) -controlled -Current treatment  . Meloxicam 15 mg 1 tablet daily . Tramadol 50 mg 1 tablet twice daily as needed -Medications previously tried: none  -Recommended to continue current medication Counseled on preference of Tylenol over NSAIDs to protect kidneys and to minimize risk of bleeding   Health Maintenance -Vaccine gaps: influenza -Current therapy:  . Prevagen 20 mg 1 capsule daily . Vitamin B complex 1 capsule daily . Glucosamine-chondroitin 500-400 mg tablet 1 tablet twice daily . Super energy herbal complex daily . Multivitamin 1 tablet daily  . Vitamin C 1000 mg 1 tablet daily . Meclizine 25 mg 1/2 tablet PRN . Probiotics and multivitamin 2 in the morning and 2 at night . Balance advanced proactive hormone support 2 in AM and 2 in PM . Elderberry daily . Vitamin B12 daily . Zinc 50 mg 1 tablet daily -Educated  on Herbal supplement research is limited and benefits usually cannot be proven Cost vs benefit of each product must be carefully weighed by individual consumer Supplements may interfere with prescription drugs -Patient is satisfied with current therapy and denies issues -Recommended stopping supplementation with Prevagen due to lack of benefit  Patient Goals/Self-Care Activities . Over the next 90 days, patient will:  - take medications as prescribed check blood pressure weekly, document, and provide at future appointments  Follow Up Plan: Telephone follow up appointment with care management team member scheduled for: 3 months      SDOH Interventions   Flowsheet Row Most Recent Value  SDOH Interventions   Financial Strain Interventions Intervention Not Indicated  Transportation Interventions Intervention Not Indicated      Hypertension   BP goal is:  <140/90  Office blood pressures are  BP Readings from Last 3 Encounters:  09/01/20 130/65  08/06/20 122/80  04/30/20 122/80   Patient checks BP at home 1-2x per week Patient home BP readings are ranging: n/a  Patient has failed these meds in the past: none Patient is currently controlled on the following medications:  . Losartan 50 mg 1  tablet daily  We discussed diet and exercise extensively -DASH eating plan recommendations: . Emphasizes vegetables, fruits, and whole-grains . Includes fat-free or low-fat dairy products, fish, poultry, beans, nuts, and vegetable oils . Limits foods that are high in saturated fat. These foods include fatty meats, full-fat dairy products, and tropical oils such as coconut, palm kernel, and palm oils. . Limits sugar-sweetened beverages and sweets . Limiting sodium intake to < 1500 mg/day -Diet: doesn't salt anything; recommended looking at prepackaged foods labels for sodium content -Exercise: does stretching in the morning and lifts 5 lbs weights  Plan BP assessment in 1 month.   Continue current medications     Hyperlipidemia   LDL goal < 100  Last lipids Lab Results  Component Value Date   CHOL 164 08/06/2019   HDL 44.00 08/06/2019   LDLCALC 97 08/06/2019   TRIG 114.0 08/06/2019   CHOLHDL 4 08/06/2019   Hepatic Function Latest Ref Rng & Units 08/06/2020 08/06/2019 02/01/2018  Total Protein 6.1 - 8.1 g/dL 6.3 6.9 6.9  Albumin 3.5 - 5.2 g/dL - 4.5 4.8  AST 10 - 35 U/L 23 23 29   ALT 6 - 29 U/L 20 19 26   Alk Phosphatase 39 - 117 U/L - 78 68  Total Bilirubin 0.2 - 1.2 mg/dL 0.4 0.4 0.4     The 10-year ASCVD risk score Mikey Bussing DC Jr., et al., 2013) is: 19.2%   Values used to calculate the score:     Age: 61 years     Sex: Female     Is Non-Hispanic African American: No     Diabetic: No     Tobacco smoker: No     Systolic Blood Pressure: 597 mmHg     Is BP treated: Yes     HDL Cholesterol: 44 mg/dL     Total Cholesterol: 164 mg/dL   Patient has failed these meds in past: none Patient is currently controlled on the following medications:  . Pravastatin 20 mg 1 tablet daily  We discussed:  diet and exercise extensively  Plan Recommend repeat lipid panel as patient is overdue. Continue current medications   Allergic rhinitis   Patient has failed these meds in past: none Patient is currently controlled on the following medications:  . Flonase 50 mcg/act nasal spray 1 spray in both nostrils at bedtime . Montelukast 10 mg 1 tablet at bedtime . Cetirizine 10 mg 1 tablet at bedtime  We discussed:  avoiding allergy triggers; patient is not sure if she has noticed a difference with montelukast   Plan Recommended for patient to discuss stopping montelukast with Dr. [name].  Continue current medications   Anxiety   Depression screen [facility] 2/9 08/06/2020 08/06/2020 02/04/2020  Decreased Interest 0 0 1  Down, Depressed, Hopeless 0 0 0  PHQ - 2 Score 0 0 1  Altered sleeping 1 - 0  Tired, decreased energy 1 - 0  Change in appetite 0 - 0  Feeling bad  or failure about yourself  0 - 0  Trouble concentrating 0 - 0  Moving slowly or fidgety/restless 0 - 0  Suicidal thoughts 0 - 0  PHQ-9 Score 2 - 1  Difficult doing work/chores Not difficult at all - Not difficult at all    Patient has failed these meds in past: none Patient is currently controlled on the following medications:  . Citalopram 10 mg 1/2 tablet daily  Plan  Continue current medications   GERD   Patient has failed these meds  in past: none Patient is currently controlled on the following medications:  . Omeprazole 20 mg 1 capsule daily  We discussed:  Non-pharmacologic management of symptoms such as elevating the head of your bed, avoiding eating 2-3 hours before bed, avoiding triggering foods such as acidic, spicy, or fatty foods, eating smaller meals, and wearing clothes that are loose around the waist; long term risk with PPIs and rebound acid reflux with tapering  Plan Consider taper at follow up. Continue current medications   Osteopenia   Last DEXA Scan:  06/05/2019  T-Score femoral neck: -2.4 (total -1.9)  T-Score total hip: n/a  T-Score lumbar spine: n/a  T-Score forearm radius: -1.3  10-year probability of major osteoporotic fracture: n/a  10-year probability of hip fracture: n/a  No results found for: VD25OH   Patient is not a candidate for pharmacologic treatment  Patient has failed these meds in past: Alendronate  Patient is currently controlled on the following medications:  Marland Kitchen Vitamin D 2000 units 1 tablet daily . Calcium 500 mg daily  . Alendronate 70 mg tablet - Sunday  We discussed:  Recommend [phone] units of vitamin D daily. Recommend 1200 mg of calcium daily from dietary and supplemental sources. Counseled on oral bisphosphonate administration: take in the morning, 30 minutes prior to food with 6-8 oz of water. Do not lie down for at least 30 minutes after taking. Recommend weight-bearing and muscle strengthening exercises for building  and maintaining bone density.; patient gets calcium from eating yogurt every day and  almond milk and does not eat much beans or seeds or green leafy vegetables  Plan  Continue current medications    Pain/OA   Patient has failed these meds in past: none Patient is currently controlled on the following medications:  Marland Kitchen Meloxicam 15 mg 1 tablet daily . Tramadol 50 mg 1 tablet twice daily PRN - usually takes once at night  We discussed:  preference of Tylenol over NSAIDs for kidneys and to minimize risk of bleeding   Plan  Continue current medications   Miscellaneous/OTC   Patient is currently on the following medications:  . Prevagen 20 mg 1 capsule daily . Vitamin B complex 1 capsule daily . Glucosamine-chondroitin 500-400 mg tablet 1 tablet twice daily . Super energy herbal complex daily . Multivitamin 1 tablet daily  . Vitamin C 1000 mg 1 tablet daily . Meclizine 25 mg 1/2 tablet PRN . Probiotics and multivitamin 2 in the morning and 2 at night . Balance advanced proactive hormone support 2 in AM and 2 in PM . Elderberry daily . Vitamin B12 daily . Zinc 50 mg 1 tablet daily  We discussed: lack of benefit with prevagen -Constipation: recommended drinking water   Plan  Continue current medications  Vaccines   Reviewed and discussed patient's vaccination history.    Immunization History  Administered Date(s) Administered  . Fluad Quad(high Dose 65+) 08/06/2019  . Influenza, High Dose Seasonal PF 08/31/2016, 09/07/2017, 07/24/2018  . PFIZER(Purple Top)SARS-COV-2 Vaccination 01/05/2020, 01/30/2020, 11/03/2020  . Pneumococcal Conjugate-13 01/22/2019  . Pneumococcal Polysaccharide-23 07/06/2020  . Tdap 11/16/2015  . Zoster Recombinat (Shingrix) 11/25/2020   Updated immunization record to include Pneumovax and COVID booster and verified with [facility].  Plan  Recommended patient receive influenza vaccine in office.   Medication Management   Patient's preferred  pharmacy is:  [id number] [address], [address] AT [address] [place] [address] Phone: [phone]  Fax: [place], [address], [address], [address] [place] [address] Phone: [phone] Fax: [phone]  Uses pill box? Yes - one for morning and one for night Pt endorses 99% compliance   We discussed: Current pharmacy is preferred with insurance plan and patient is satisfied with pharmacy services  Plan  Continue current medication management strategy   Follow up: 3 month phone visit  Jeni Salles, PharmD [place] Pharmacist [place] [facility] [place]

## 2020-12-10 ENCOUNTER — Other Ambulatory Visit: Payer: Self-pay | Admitting: Family Medicine

## 2020-12-10 ENCOUNTER — Encounter: Payer: Self-pay | Admitting: Family Medicine

## 2021-01-03 ENCOUNTER — Other Ambulatory Visit: Payer: Self-pay | Admitting: Family Medicine

## 2021-01-03 DIAGNOSIS — J309 Allergic rhinitis, unspecified: Secondary | ICD-10-CM

## 2021-01-04 ENCOUNTER — Other Ambulatory Visit: Payer: Self-pay | Admitting: Family Medicine

## 2021-01-04 DIAGNOSIS — J309 Allergic rhinitis, unspecified: Secondary | ICD-10-CM

## 2021-01-05 NOTE — Patient Instructions (Addendum)
Hi Tabitha Adams,  It was lovely getting to meet you! As we discussed, please make sure to check your blood pressure weekly at home and continue working on making some of those diet changes we discussed. Please give me a call if you have any questions or need anything before our follow up.  Best, Maddie  Jeni Salles, PharmD [facility] Clinical Pharmacist [place] [facility] [place] [phone]   Visit Information  Patient Care Plan: CCM Pharmacy Care Plan    Problem Identified: Problem: HTN, HLD, allergic rhinitis, anxiety, GERD, osteopenia, pain/OA     Long-Range Goal: Patient-Specific Goal   Start Date: 12/03/2020  Expected End Date: 12/03/2021  This Visit's Progress: On track  Priority: High  Note:   Current Barriers:  . Unable to independently monitor therapeutic efficacy  Pharmacist Clinical Goal(s):  Marland Kitchen Over the next 90 days, patient will achieve adherence to monitoring guidelines and medication adherence to achieve therapeutic efficacy through collaboration with PharmD and provider.   Interventions: . 1:1 collaboration with [name], [name], MD regarding development and update of comprehensive plan of care as evidenced by provider attestation and co-signature . Inter-disciplinary care team collaboration (see longitudinal plan of care) . Comprehensive medication review performed; medication list updated in electronic medical record  Hypertension (BP goal <140/90) -controlled -Current treatment: . Losartan 50 mg 1 tablet daily -Medications previously tried: none  -Current home readings: did not provide -Current dietary habits: does not currently add salt to foods but does eat a lot of prepackaged foods -Current exercise habits: no structured exercise but does stand on feet during work hours -Denies hypotensive/hypertensive symptoms -Educated on Exercise goal of 150 minutes per week; Importance of home blood pressure monitoring; -Counseled to monitor BP at home weekly,  document, and provide log at future appointments -Counseled on diet and exercise extensively Recommended to continue current medication  Hyperlipidemia: (LDL goal < 100) -controlled -Current treatment: . Pravastatin 20 mg 1 tablet daily -Medications previously tried: none  -Current dietary patterns: eats a lot of prepackaged foods -Current exercise habits: no structured exercise but does stand on feet during work hours -Educated on Cholesterol goals;  Exercise goal of 150 minutes per week; -Counseled on diet and exercise extensively Recommended to continue current medication  Allergic rhinitis (Goal: minimize symptoms of allergies) -controlled -Current treatment  . Flonase 50 mcg/act nasal spray 1 spray in both nostrils at bedtime . Montelukast 10 mg 1 tablet at bedtime . Cetirizine 10 mg 1 tablet at bedtime -Medications previously tried: none  -Recommended to continue current medication Recommended for patient to discuss with Dr. [name] about stopping montelukast without benefit  Anxiety (Goal: minimize symptoms of anxiety) -controlled -Current treatment: . Citalopram 10 mg 1/2 tablet daily -Medications previously tried/failed: none -PHQ9: 2 -GAD7: 2 -Educated on Benefits of medication for symptom control -Recommended to continue current medication   GERD (Goal: minimize symptoms of heartburn or acid reflux) -controlled -Current treatment  . Omeprazole 20 mg 1 capsule daily -Medications previously tried: none  -Counseled on long term risks of taking PPIs and rebound acid reflux with tapering Educated on non-pharmacologic management of symptoms such as elevating the head of your bed, avoiding eating 2-3 hours before bed, avoiding triggering foods such as acidic, spicy, or fatty foods, eating smaller meals, and wearing clothes that are loose around the waist  Osteopenia (Goal improve bone density and prevent fractures) -controlled -Last DEXA Scan:  06/05/2019  T-Score  femoral neck: -2.4 (total -1.9)  T-Score total hip: n/a  T-Score lumbar  spine: n/a  T-Score forearm radius: -1.3  10-year probability of major osteoporotic fracture: n/a  10-year probability of hip fracture: n/a -Patient is not a candidate for pharmacologic treatment -Current treatment  . Vitamin D 2000 units 1 tablet daily . Calcium 500 mg daily  . Alendronate 70 mg tablet (Sundays) -Medications previously tried: none  -Recommend [phone] units of vitamin D daily. Recommend 1200 mg of calcium daily from dietary and supplemental sources. Counseled on oral bisphosphonate administration: take in the morning, 30 minutes prior to food with 6-8 oz of water. Do not lie down for at least 30 minutes after taking. Recommend weight-bearing and muscle strengthening exercises for building and maintaining bone density. -Recommended to continue current medication  Pain (Goal: minimize symptoms of ppain) -controlled -Current treatment  . Meloxicam 15 mg 1 tablet daily . Tramadol 50 mg 1 tablet twice daily as needed -Medications previously tried: none  -Recommended to continue current medication Counseled on preference of Tylenol over NSAIDs to protect kidneys and to minimize risk of bleeding   Health Maintenance -Vaccine gaps: influenza -Current therapy:  . Prevagen 20 mg 1 capsule daily . Vitamin B complex 1 capsule daily . Glucosamine-chondroitin 500-400 mg tablet 1 tablet twice daily . Super energy herbal complex daily . Multivitamin 1 tablet daily  . Vitamin C 1000 mg 1 tablet daily . Meclizine 25 mg 1/2 tablet PRN . Probiotics and multivitamin 2 in the morning and 2 at night . Balance advanced proactive hormone support 2 in AM and 2 in PM . Elderberry daily . Vitamin B12 daily . Zinc 50 mg 1 tablet daily -Educated on Herbal supplement research is limited and benefits usually cannot be proven Cost vs benefit of each product must be carefully weighed by individual consumer Supplements  may interfere with prescription drugs -Patient is satisfied with current therapy and denies issues -Recommended stopping supplementation with Prevagen due to lack of benefit  Patient Goals/Self-Care Activities . Over the next 90 days, patient will:  - take medications as prescribed check blood pressure weekly, document, and provide at future appointments  Follow Up Plan: Telephone follow up appointment with care management team member scheduled for: 3 months      Tabitha Adams was given information about Chronic Care Management services today including:  1. CCM service includes personalized support from designated clinical staff supervised by her physician, including individualized plan of care and coordination with other care providers 2. 24/7 contact phone numbers for assistance for urgent and routine care needs. 3. Standard insurance, coinsurance, copays and deductibles apply for chronic care management only during months in which we provide at least 20 minutes of these services. Most insurances cover these services at 100%, however patients may be responsible for any copay, coinsurance and/or deductible if applicable. This service may help you avoid the need for more expensive face-to-face services. 4. Only one practitioner may furnish and bill the service in a calendar month. 5. The patient may stop CCM services at any time (effective at the end of the month) by phone call to the office staff.  Patient agreed to services and verbal consent obtained.   The patient verbalized understanding of instructions, educational materials, and care plan provided today and agreed to receive a mailed copy of patient instructions, educational materials, and care plan.  Telephone follow up appointment with pharmacy team member scheduled for: 3 months  Viona Gilmore, [facility]  [email or web address]">  DASH Eating Plan DASH stands for Dietary Approaches to  Stop Hypertension. The DASH  eating plan is a healthy eating plan that has been shown to:  Reduce high blood pressure (hypertension).  Reduce your risk for type 2 diabetes, heart disease, and stroke.  Help with weight loss. What are tips for following this plan? Reading food labels  Check food labels for the amount of salt (sodium) per serving. Choose foods with less than 5 percent of the Daily Value of sodium. Generally, foods with less than 300 milligrams (mg) of sodium per serving fit into this eating plan.  To find whole grains, look for the word "whole" as the first word in the ingredient list. Shopping  Buy products labeled as "low-sodium" or "no salt added."  Buy fresh foods. Avoid canned foods and pre-made or frozen meals. Cooking  Avoid adding salt when cooking. Use salt-free seasonings or herbs instead of table salt or sea salt. Check with your health care provider or pharmacist before using salt substitutes.  Do not fry foods. Cook foods using healthy methods such as baking, boiling, grilling, roasting, and broiling instead.  Cook with heart-healthy oils, such as olive, canola, avocado, soybean, or sunflower oil. Meal planning  Eat a balanced diet that includes: ? 4 or more servings of fruits and 4 or more servings of vegetables each day. Try to fill one-half of your plate with fruits and vegetables. ? 6-8 servings of whole grains each day. ? Less than 6 oz (170 g) of lean meat, poultry, or fish each day. A 3-oz (85-g) serving of meat is about the same size as a deck of cards. One egg equals 1 oz (28 g). ? 2-3 servings of low-fat dairy each day. One serving is 1 cup (237 mL). ? 1 serving of nuts, seeds, or beans 5 times each week. ? 2-3 servings of heart-healthy fats. Healthy fats called omega-3 fatty acids are found in foods such as walnuts, flaxseeds, fortified milks, and eggs. These fats are also found in cold-water fish, such as sardines, salmon, and mackerel.  Limit  how much you eat of: ? Canned or prepackaged foods. ? Food that is high in trans fat, such as some fried foods. ? Food that is high in saturated fat, such as fatty meat. ? Desserts and other sweets, sugary drinks, and other foods with added sugar. ? Full-fat dairy products.  Do not salt foods before eating.  Do not eat more than 4 egg yolks a week.  Try to eat at least 2 vegetarian meals a week.  Eat more home-cooked food and less restaurant, buffet, and fast food.   Lifestyle  When eating at a restaurant, ask that your food be prepared with less salt or no salt, if possible.  If you drink alcohol: ? Limit how much you use to:  0-1 drink a day for women who are not pregnant.  0-2 drinks a day for men. ? Be aware of how much alcohol is in your drink. In the [place] one drink equals one 12 oz bottle of beer (355 mL), one 5 oz glass of wine (148 mL), or one 1 oz glass of hard liquor (44 mL). General information  Avoid eating more than 2,300 mg of salt a day. If you have hypertension, you may need to reduce your sodium intake to 1,500 mg a day.  Work with your health care provider to maintain a healthy body weight or to lose weight. Ask what an ideal weight is for you.  Get at least 30 minutes of exercise that causes your heart to beat  faster (aerobic exercise) most days of the week. Activities may include walking, swimming, or biking.  Work with your health care provider or dietitian to adjust your eating plan to your individual calorie needs. What foods should I eat? Fruits All fresh, dried, or frozen fruit. Canned fruit in natural juice (without added sugar). Vegetables Fresh or frozen vegetables (raw, steamed, roasted, or grilled). Low-sodium or reduced-sodium tomato and vegetable juice. Low-sodium or reduced-sodium tomato sauce and tomato paste. Low-sodium or reduced-sodium canned vegetables. Grains Whole-grain or whole-wheat bread. Whole-grain or whole-wheat pasta. Brown  rice. [place]. Bulgur. Whole-grain and low-sodium cereals. Pita bread. Low-fat, low-sodium crackers. Whole-wheat flour tortillas. Meats and other proteins Skinless chicken or [place]. Ground chicken or [place]. Pork with fat trimmed off. Fish and seafood. Egg whites. Dried beans, peas, or lentils. Unsalted nuts, nut butters, and seeds. Unsalted canned beans. Lean cuts of beef with fat trimmed off. Low-sodium, lean precooked or cured meat, such as sausages or meat loaves. Dairy Low-fat (1%) or fat-free (skim) milk. Reduced-fat, low-fat, or fat-free cheeses. Nonfat, low-sodium ricotta or cottage cheese. Low-fat or nonfat yogurt. Low-fat, low-sodium cheese. Fats and oils Soft margarine without trans fats. Vegetable oil. Reduced-fat, low-fat, or light mayonnaise and salad dressings (reduced-sodium). Canola, safflower, olive, avocado, soybean, and sunflower oils. Avocado. Seasonings and condiments Herbs. Spices. Seasoning mixes without salt. Other foods Unsalted popcorn and pretzels. Fat-free sweets. The items listed above may not be a complete list of foods and beverages you can eat. Contact a dietitian for more information. What foods should I avoid? Fruits Canned fruit in a light or heavy syrup. Fried fruit. Fruit in cream or butter sauce. Vegetables Creamed or fried vegetables. Vegetables in a cheese sauce. Regular canned vegetables (not low-sodium or reduced-sodium). Regular canned tomato sauce and paste (not low-sodium or reduced-sodium). Regular tomato and vegetable juice (not low-sodium or reduced-sodium). Angie Fava. Olives. Grains Baked goods made with fat, such as croissants, muffins, or some breads. Dry pasta or rice meal packs. Meats and other proteins Fatty cuts of meat. Ribs. Fried meat. Berniece Salines. Bologna, salami, and other precooked or cured meats, such as sausages or meat loaves. Fat from the back of a pig (fatback). Bratwurst. Salted nuts and seeds. Canned beans with added salt.  Canned or smoked fish. Whole eggs or egg yolks. Chicken or [place] with skin. Dairy Whole or 2% milk, cream, and half-and-half. Whole or full-fat cream cheese. Whole-fat or sweetened yogurt. Full-fat cheese. Nondairy creamers. Whipped toppings. Processed cheese and cheese spreads. Fats and oils Butter. Stick margarine. Lard. Shortening. Ghee. Bacon fat. Tropical oils, such as coconut, palm kernel, or palm oil. Seasonings and condiments Onion salt, garlic salt, seasoned salt, table salt, and sea salt. Worcestershire sauce. Tartar sauce. Barbecue sauce. Teriyaki sauce. Soy sauce, including reduced-sodium. Steak sauce. Canned and packaged gravies. Fish sauce. Oyster sauce. Cocktail sauce. Store-bought horseradish. Ketchup. Mustard. Meat flavorings and tenderizers. Bouillon cubes. Hot sauces. Pre-made or packaged marinades. Pre-made or packaged taco seasonings. Relishes. Regular salad dressings. Other foods Salted popcorn and pretzels. The items listed above may not be a complete list of foods and beverages you should avoid. Contact a dietitian for more information. Where to find more information  National Heart, Lung, and Blood Institute: https://wilson-eaton.com/  American Heart Association: www.heart.org  Academy of Nutrition and Dietetics: www.eatright.[place]: www.kidney.org Summary  The DASH eating plan is a healthy eating plan that has been shown to reduce high blood pressure (hypertension). It may also reduce your risk for type 2 diabetes,  heart disease, and stroke.  When on the DASH eating plan, aim to eat more fresh fruits and vegetables, whole grains, lean proteins, low-fat dairy, and heart-healthy fats.  With the DASH eating plan, you should limit salt (sodium) intake to 2,300 mg a day. If you have hypertension, you may need to reduce your sodium intake to 1,500 mg a day.  Work with your health care provider or dietitian to adjust your eating plan to your individual  calorie needs. This information is not intended to replace advice given to you by your health care provider. Make sure you discuss any questions you have with your health care provider. Document Revised: 10/05/2019 Document Reviewed: 10/05/2019 Elsevier Patient Education  2021 [employer].

## 2021-02-03 ENCOUNTER — Ambulatory Visit: Payer: Medicare Other | Admitting: Family Medicine

## 2021-02-11 ENCOUNTER — Encounter: Payer: Self-pay | Admitting: Family Medicine

## 2021-02-11 ENCOUNTER — Ambulatory Visit (INDEPENDENT_AMBULATORY_CARE_PROVIDER_SITE_OTHER): Payer: Medicare Other | Admitting: Family Medicine

## 2021-02-11 ENCOUNTER — Other Ambulatory Visit: Payer: Self-pay

## 2021-02-11 VITALS — BP 130/70 | HR 93 | Resp 16 | Ht 59.0 in | Wt 144.1 lb

## 2021-02-11 DIAGNOSIS — I10 Essential (primary) hypertension: Secondary | ICD-10-CM

## 2021-02-11 DIAGNOSIS — M159 Polyosteoarthritis, unspecified: Secondary | ICD-10-CM | POA: Diagnosis not present

## 2021-02-11 DIAGNOSIS — G894 Chronic pain syndrome: Secondary | ICD-10-CM

## 2021-02-11 DIAGNOSIS — R739 Hyperglycemia, unspecified: Secondary | ICD-10-CM

## 2021-02-11 DIAGNOSIS — E785 Hyperlipidemia, unspecified: Secondary | ICD-10-CM

## 2021-02-11 NOTE — Progress Notes (Signed)
HPI: Tabitha Adams is a 75 y.o. female, who is here today for 6 months follow up.   She was last seen on 08/06/20. No new problems since her last visit. Chronic pain:  She is on tramadol 50 mg twice daily as needed and meloxicam 15 mg daily. She has tolerated medication well, she has not noticed side effects. Medications are still helping with pain. Generalized OA affecting IP of hands and feet, knees, thoracic and lower back. Pain can be severe.  Pain is exacerbated by certain activities and alleviated by rest. Pain does not interfere with ADLs.  She has not noted edema or erythema of joints. Lumbar back pain is not radiated to lower extremities. Negative for saddle anesthesia or changes in bladder/bowel function.  HTN: Currently she is on losartan 50 mg daily. Negative for severe/frequent headache, visual changes, chest pain, dyspnea, palpitation, claudication, focal weakness, or edema.  Lab Results  Component Value Date   CREATININE 0.72 08/06/2020   BUN 13 08/06/2020   NA 137 08/06/2020   K 4.1 08/06/2020   CL 103 08/06/2020   CO2 29 08/06/2020   Glucose has been mildly elevated at 115. No hx of diabetes. Negative for polydipsia,polyuria, or polyphagia.  HLD: She is on pravastatin 20 mg daily. She has tolerated medication well.  Lab Results  Component Value Date   CHOL 164 08/06/2019   HDL 44.00 08/06/2019   LDLCALC 97 08/06/2019   TRIG 114.0 08/06/2019   CHOLHDL 4 08/06/2019   Nodular lesion on right-sided of forehead. She has had lesion for many years. She is not having pain,skin changes, and size seems to be stable. No hx of trauma.  Review of Systems  Constitutional: Negative for activity change, appetite change and fever.  HENT: Negative for mouth sores, nosebleeds and sore throat.   Respiratory: Negative for cough and wheezing.   Gastrointestinal: Negative for abdominal pain, nausea and vomiting.       Negative for changes in bowel  habits.  Genitourinary: Negative for decreased urine volume and hematuria.  Skin: Negative for pallor and rash.  Neurological: Negative for dizziness, syncope, facial asymmetry and weakness.  Rest of ROS, see pertinent positives sand negatives in HPI  Current Outpatient Medications on File Prior to Visit  Medication Sig Dispense Refill  . alendronate (FOSAMAX) 70 MG tablet TAKE 1 TABLET BY MOUTH  EVERY 7 DAYS WITH A FULL  GLASS OF WATER ON AN EMPTY  STOMACH 12 tablet 3  . Apoaequorin (PREVAGEN EXTRA STRENGTH) 20 MG CAPS Take 1 tablet by mouth daily.    Marland Kitchen b complex vitamins capsule Take 1 capsule by mouth daily.    . Calcium-Phosphorus-Vitamin D [id number] MG-MG-UNIT CHEW Chew by mouth.    . cetirizine (ZYRTEC) 10 MG tablet Take 10 mg by mouth at bedtime.    . Cholecalciferol (VITAMIN D3) 50 MCG (2000 UT) TABS Take by mouth daily.    . citalopram (CELEXA) 10 MG tablet TAKE 1/2 TABLET(5 MG) BY MOUTH DAILY 45 tablet 3  . fluticasone (FLONASE) 50 MCG/ACT nasal spray SHAKE LIQUID AND USE 1 SPRAY IN EACH NOSTRIL TWICE DAILY AS NEEDED FOR ALLERGIES OR RHINITIS 16 g 11  . glucosamine-chondroitin 500-400 MG tablet Take 1 tablet by mouth 2 (two) times daily.    Marland Kitchen losartan (COZAAR) 50 MG tablet TAKE 1 TABLET BY MOUTH  DAILY 90 tablet 3  . meclizine (ANTIVERT) 25 MG tablet TAKE 1/2 TO 1 TABLET BY  MOUTH TWICE DAILY AS NEEDED FOR  DIZZINESS 90 tablet 1  . meloxicam (MOBIC) 15 MG tablet TAKE 1 TABLET BY MOUTH  DAILY 90 tablet 3  . montelukast (SINGULAIR) 10 MG tablet TAKE 1 TABLET BY MOUTH AT  BEDTIME 90 tablet 3  . Multiple Vitamin (MULTIVITAMIN WITH MINERALS) TABS tablet Take 1 tablet by mouth daily.    Marland Kitchen omeprazole (PRILOSEC) 20 MG capsule TAKE 1 CAPSULE BY MOUTH  DAILY 90 capsule 3  . polyethylene glycol (MIRALAX / GLYCOLAX) 17 g packet Take 17 g by mouth daily.    . pravastatin (PRAVACHOL) 20 MG tablet TAKE 1 TABLET BY MOUTH  DAILY 90 tablet 3  . traMADol (ULTRAM) 50 MG tablet TAKE 1 TABLET(50 MG) BY  MOUTH TWICE DAILY AS NEEDED 60 tablet 3  . vitamin C (VITAMIN C) 1000 MG tablet Take 1 tablet (1,000 mg total) by mouth daily. 30 tablet 0   No current facility-administered medications on file prior to visit.     Past Medical History:  Diagnosis Date  . Allergy   . Anemia   . Arthritis   . GERD (gastroesophageal reflux disease)   . Hyperlipidemia    Allergies  Allergen Reactions  . Cymbalta [Duloxetine Hcl] Nausea Only    Adverse reaction caused dizziness and nausea  . Elemental Sulfur Swelling and Other (See Comments)    Reaction:  All over body swelling   . Penicillins Rash and Other (See Comments)    Has patient had a PCN reaction causing immediate rash, facial/tongue/throat swelling, SOB or lightheadedness with hypotension: No Has patient had a PCN reaction causing severe rash involving mucus membranes or skin necrosis: No Has patient had a PCN reaction that required hospitalization No Has patient had a PCN reaction occurring within the last 10 years: No If all of the above answers are "NO", then may proceed with Cephalosporin use.    Social History   Socioeconomic History  . Marital status: Widowed    Spouse name: Not on file  . Number of children: 3  . Years of education: Not on file  . Highest education level: Not on file  Occupational History  . Occupation: Surveyor, quantity: [employer]    Comment: works 25-30hrs/week  Tobacco Use  . Smoking status: Former Research scientist (life sciences)  . Smokeless tobacco: Never Used  Vaping Use  . Vaping Use: Never used  Substance and Sexual Activity  . Alcohol use: No  . Drug use: No  . Sexual activity: Not Currently    Comment: 1st intercourse 75 yo-Fewer than 5 partners  Other Topics Concern  . Not on file  Social History Narrative   01/22/2019:   Widowed since 10/2017; lives alone now in [place] house with 2 dogs   Has one son, and one stepson and Psychiatrist. Sons live close by, supportive.   Has 5 grandchildren and several great  grandchildren, active in their lives   Continues to work PT as Scientist, water quality, likes to stay busy   [employer] community support system   Social Determinants of Radio broadcast assistant Strain: [place]   . Difficulty of Paying Living Expenses: Not hard at all  Food Insecurity: Not on file  Transportation Needs: No Transportation Needs  . Lack of Transportation (Medical): No  . Lack of Transportation (Non-Medical): No  Physical Activity: Not on file  Stress: Not on file  Social Connections: Not on file    Vitals:   02/11/21 1521  BP: 130/70  Pulse: 93  Resp: 16  SpO2: 97%  Body mass index is 29.11 kg/m.   Physical Exam Vitals and nursing note reviewed.  Constitutional:      General: She is not in acute distress.    Appearance: She is well-developed.  HENT:     Head: Normocephalic and atraumatic.      Mouth/Throat:     Mouth: Mucous membranes are moist.  Eyes:     Conjunctiva/sclera: Conjunctivae normal.  Cardiovascular:     Rate and Rhythm: Normal rate and regular rhythm.     Pulses:          Dorsalis pedis pulses are 2+ on the right side and 2+ on the left side.     Heart sounds: No murmur heard.   Pulmonary:     Effort: Pulmonary effort is normal. No respiratory distress.     Breath sounds: Normal breath sounds.  Abdominal:     Palpations: Abdomen is soft. There is no hepatomegaly or mass.     Tenderness: There is no abdominal tenderness.  Musculoskeletal:     Comments: No signs of synovitis. Some DIP with firm nodules. Antalgic gait, not assisted.  Lymphadenopathy:     Cervical: No cervical adenopathy.  Skin:    General: Skin is warm.     Findings: No erythema or rash.  Neurological:     General: No focal deficit present.     Mental Status: She is alert and oriented to person, place, and time.     Cranial Nerves: No cranial nerve deficit.  Psychiatric:     Comments: Well groomed, good eye contact.   ASSESSMENT AND PLAN:  Ms. Oaklee Esther Valentino  was seen today for 6 months follow-up.  Diagnoses and all orders for this visit: Orders Placed This Encounter  Procedures  . Lipid panel  . Hemoglobin A1c  . Basic metabolic panel  . CBC with Differential/Platelet    Generalized osteoarthritis of multiple sites In general pain is adequately controlled. Continue Tramadol 50 mg bid prn and Meloxican 15 mg daily prn. Some side effects reviewed.  Hyperlipidemia, unspecified hyperlipidemia type Continue Pravastatin 20 mg daily and low fat diet. She is not fasting today, will come back for fasting labs.  Essential hypertension, benign BP adequately controlled. Continue Losartan 50 mg daily and low salt diet.  Chronic pain disorder Med contract signed today. We discussed some current guidelines in regard to controlled medications for pain management. PDMP reviewed.  Hyperglycemia Healthy life style for primary prevention of diabetes is recommended. Further recommendations according to HgA1C.  In regard to subcutaneous lesion in forehead, which seems to be chronic and asymptomatic,we discussed possible etiologies. Hx and examination do not suggest a serious process. If changes we could arrange soft tissue US. She agrees on holding on imaging for now,continue monitoring for now.  Return in about 6 months (around 08/14/2021) for Fasting labs appt. CPE.   Julen Rubert G. [name], MD  [facility]. [place] office.   A few things to remember from today's visit:   Hyperlipidemia, unspecified hyperlipidemia type  Generalized osteoarthritis of multiple sites  Essential hypertension, benign  Chronic pain disorder  If you need refills please call your pharmacy. Do not use My Chart to request refills or for acute issues that need immediate attention.   Continue monitor bony prominence for changes. No changes today. Please arrange lab appt for fasting labs.  Please be sure medication list is accurate. If a new problem  present, please set up appointment sooner than planned today.

## 2021-02-11 NOTE — Patient Instructions (Addendum)
A few things to remember from today's visit:   Hyperlipidemia, unspecified hyperlipidemia type  Generalized osteoarthritis of multiple sites  Essential hypertension, benign  Chronic pain disorder  If you need refills please call your pharmacy. Do not use My Chart to request refills or for acute issues that need immediate attention.   Continue monitor bony prominence for changes. No changes today. Please arrange lab appt for fasting labs.  Please be sure medication list is accurate. If a new problem present, please set up appointment sooner than planned today.

## 2021-02-15 ENCOUNTER — Encounter: Payer: Self-pay | Admitting: Family Medicine

## 2021-02-20 ENCOUNTER — Other Ambulatory Visit: Payer: Medicare Other

## 2021-02-24 ENCOUNTER — Other Ambulatory Visit: Payer: Self-pay

## 2021-02-24 ENCOUNTER — Other Ambulatory Visit (INDEPENDENT_AMBULATORY_CARE_PROVIDER_SITE_OTHER): Payer: Medicare Other

## 2021-02-24 DIAGNOSIS — I10 Essential (primary) hypertension: Secondary | ICD-10-CM

## 2021-02-24 DIAGNOSIS — E785 Hyperlipidemia, unspecified: Secondary | ICD-10-CM | POA: Diagnosis not present

## 2021-02-24 DIAGNOSIS — R739 Hyperglycemia, unspecified: Secondary | ICD-10-CM

## 2021-02-24 LAB — CBC WITH DIFFERENTIAL/PLATELET
Basophils Absolute: 0 10*3/uL (ref 0.0–0.1)
Basophils Relative: 0.2 % (ref 0.0–3.0)
Eosinophils Absolute: 0.2 10*3/uL (ref 0.0–0.7)
Eosinophils Relative: 3.9 % (ref 0.0–5.0)
HCT: 35.3 % — ABNORMAL LOW (ref 36.0–46.0)
Hemoglobin: 12 g/dL (ref 12.0–15.0)
Lymphocytes Relative: 27.1 % (ref 12.0–46.0)
Lymphs Abs: 1.5 10*3/uL (ref 0.7–4.0)
MCHC: 34 g/dL (ref 30.0–36.0)
MCV: 83.2 fl (ref 78.0–100.0)
Monocytes Absolute: 0.5 10*3/uL (ref 0.1–1.0)
Monocytes Relative: 9 % (ref 3.0–12.0)
Neutro Abs: 3.2 10*3/uL (ref 1.4–7.7)
Neutrophils Relative %: 59.8 % (ref 43.0–77.0)
Platelets: 297 10*3/uL (ref 150.0–400.0)
RBC: 4.24 Mil/uL (ref 3.87–5.11)
RDW: 12.9 % (ref 11.5–15.5)
WBC: 5.4 10*3/uL (ref 4.0–10.5)

## 2021-02-24 LAB — LIPID PANEL
Cholesterol: 140 mg/dL (ref 0–200)
HDL: 47.7 mg/dL (ref >39.00)
LDL Cholesterol: 65 mg/dL (ref 0–99)
NonHDL: 91.8
Total CHOL/HDL Ratio: 3
Triglycerides: 135 mg/dL (ref 0.0–149.0)
VLDL: 27 mg/dL (ref 0.0–40.0)

## 2021-02-24 LAB — HEMOGLOBIN A1C: Hgb A1c MFr Bld: 5.8 % (ref 4.6–6.5)

## 2021-02-24 LAB — BASIC METABOLIC PANEL
BUN: 15 mg/dL (ref 6–23)
CO2: 28 mEq/L (ref 19–32)
Calcium: 9.9 mg/dL (ref 8.4–10.5)
Chloride: 98 mEq/L (ref 96–112)
Creatinine, Ser: 0.79 mg/dL (ref 0.40–1.20)
GFR: 73.37 mL/min (ref >60.00)
Glucose, Bld: 84 mg/dL (ref 70–99)
Potassium: 4.4 mEq/L (ref 3.5–5.1)
Sodium: 134 mEq/L — ABNORMAL LOW (ref 135–145)

## 2021-03-04 ENCOUNTER — Telehealth: Payer: Self-pay | Admitting: Pharmacist

## 2021-03-04 NOTE — Chronic Care Management (AMB) (Signed)
I left the patient a message about her upcoming appointment on 04.21.2022 @ 3:30 pm with the clinical pharmacist. She was asked to please have all medication on hand to review with the pharmacist.   Tabitha Adams) Mare Ferrari, [place] [phone]

## 2021-03-05 ENCOUNTER — Ambulatory Visit (INDEPENDENT_AMBULATORY_CARE_PROVIDER_SITE_OTHER): Payer: Medicare Other | Admitting: Pharmacist

## 2021-03-05 DIAGNOSIS — I10 Essential (primary) hypertension: Secondary | ICD-10-CM | POA: Diagnosis not present

## 2021-03-05 DIAGNOSIS — E785 Hyperlipidemia, unspecified: Secondary | ICD-10-CM

## 2021-03-05 NOTE — Patient Instructions (Addendum)

## 2021-03-05 NOTE — Progress Notes (Signed)
Chronic Care Management Pharmacy Note  03/12/2021 Name:  Tabitha Adams MRN:  [id number] DOB:  January 19, 1946  Subjective: Tabitha Adams is an 75 y.o. year old female who is a primary patient of [name], Malka So, MD.  The CCM team was consulted for assistance with disease management and care coordination needs.    Engaged with patient by telephone for follow up visit in response to provider referral for pharmacy case management and/or care coordination services.   Consent to Services:  The patient was given information about Chronic Care Management services, agreed to services, and gave verbal consent prior to initiation of services.  Please see initial visit note for detailed documentation.   Patient Care Team: [name], [name], MD as PCP - General (Family Medicine) Marica Otter, [place] (Optometry) Viona Gilmore, [facility] as Pharmacist (Pharmacist)  Recent office visits: 02/11/21 Betty [name], MD: Patient presented for 6 month follow up.   Recent consult visits: 09/01/20 Alonza Bogus, DO (neurology): Patient presented to establish care for tremor and vertigo.  04/30/20 [name], MD (OBGYN): Patient presented for management of post hysterectomy and cystocele and post pessary insertion.  02/21/20 Allen Kell (optometry): Patient presented for eye exam. Unable to access notes.  Hospital visits: None in previous 6 months  Objective:  Lab Results  Component Value Date   CREATININE 0.79 02/24/2021   BUN 15 02/24/2021   GFR 73.37 02/24/2021   GFRNONAA 82 08/06/2020   GFRAA 96 08/06/2020   NA 134 (L) 02/24/2021   K 4.4 02/24/2021   CALCIUM 9.9 02/24/2021   CO2 28 02/24/2021   GLUCOSE 84 02/24/2021    Lab Results  Component Value Date/Time   HGBA1C 5.8 02/24/2021 07:52 AM   HGBA1C 5.6 03/09/2017 09:03 AM   GFR 73.37 02/24/2021 07:52 AM   GFR 67.21 02/04/2020 04:13 PM    Last diabetic Eye exam: No results found for: HMDIABEYEEXA  Last diabetic Foot exam:  No results found for: HMDIABFOOTEX   Lab Results  Component Value Date   CHOL 140 02/24/2021   HDL 47.70 02/24/2021   LDLCALC 65 02/24/2021   TRIG 135.0 02/24/2021   CHOLHDL 3 02/24/2021    Hepatic Function Latest Ref Rng & Units 08/06/2020 08/06/2019 02/01/2018  Total Protein 6.1 - 8.1 g/dL 6.3 6.9 6.9  Albumin 3.5 - 5.2 g/dL - 4.5 4.8  AST 10 - 35 U/L 23 23 29   ALT 6 - 29 U/L 20 19 26   Alk Phosphatase 39 - 117 U/L - 78 68  Total Bilirubin 0.2 - 1.2 mg/dL 0.4 0.4 0.4    Lab Results  Component Value Date/Time   TSH 0.86 07/24/2018 09:48 AM   TSH 0.85 06/11/2016 12:00 AM   TSH 2.37 12/12/2006 09:08 AM    CBC Latest Ref Rng & Units 02/24/2021 02/01/2018 01/25/2017  WBC 4.0 - 10.5 K/uL 5.4 5.2 11.8(H)  Hemoglobin 12.0 - 15.0 g/dL 12.0 11.7(L) 11.1(L)  Hematocrit 36.0 - 46.0 % 35.3(L) 33.8(L) 31.9(L)  Platelets 150.0 - 400.0 K/uL 297.0 293.0 244    No results found for: VD25OH  Clinical ASCVD: No  The 10-year ASCVD risk score Mikey Bussing DC Jr., et al., 2013) is: 21%   Values used to calculate the score:     Age: 4 years     Sex: Female     Is Non-Hispanic African American: No     Diabetic: No     Tobacco smoker: No     Systolic Blood Pressure: 428 mmHg  Is BP treated: Yes     HDL Cholesterol: 47.7 mg/dL     Total Cholesterol: 140 mg/dL    Depression screen [facility] 2/9 08/06/2020 08/06/2020 02/04/2020  Decreased Interest 0 0 1  Down, Depressed, Hopeless 0 0 0  PHQ - 2 Score 0 0 1  Altered sleeping 1 - 0  Tired, decreased energy 1 - 0  Change in appetite 0 - 0  Feeling bad or failure about yourself  0 - 0  Trouble concentrating 0 - 0  Moving slowly or fidgety/restless 0 - 0  Suicidal thoughts 0 - 0  PHQ-9 Score 2 - 1  Difficult doing work/chores Not difficult at all - Not difficult at all      Social History   Tobacco Use  Smoking Status Former Smoker  Smokeless Tobacco Never Used   BP Readings from Last 3 Encounters:  02/11/21 130/70  09/01/20 130/65  08/06/20  122/80   Pulse Readings from Last 3 Encounters:  02/11/21 93  09/01/20 95  08/06/20 82   Wt Readings from Last 3 Encounters:  02/11/21 144 lb 2 oz (65.4 kg)  09/01/20 142 lb (64.4 kg)  08/06/20 143 lb 2 oz (64.9 kg)   BMI Readings from Last 3 Encounters:  02/11/21 29.11 kg/m  09/01/20 28.68 kg/m  08/06/20 29.91 kg/m    Assessment/Interventions: Review of patient past medical history, allergies, medications, health status, including review of consultants reports, laboratory and other test data, was performed as part of comprehensive evaluation and provision of chronic care management services.   SDOH:  (Social Determinants of Health) assessments and interventions performed: No  SDOH Screenings   Alcohol Screen: Not on file  Depression (PHQ2-9): Low Risk   . PHQ-2 Score: 2  Financial Resource Strain: Low Risk   . Difficulty of Paying Living Expenses: Not hard at all  Food Insecurity: Not on file  Housing: Not on file  Physical Activity: Not on file  Social Connections: Not on file  Stress: Not on file  Tobacco Use: Medium Risk  . Smoking Tobacco Use: Former Smoker  . Smokeless Tobacco Use: Never Used  Transportation Needs: No Transportation Needs  . Lack of Transportation (Medical): No  . Lack of Transportation (Non-Medical): No    CCM Care Plan  Allergies  Allergen Reactions  . Cymbalta [Duloxetine Hcl] Nausea Only    Adverse reaction caused dizziness and nausea  . Elemental Sulfur Swelling and Other (See Comments)    Reaction:  All over body swelling   . Penicillins Rash and Other (See Comments)    Has patient had a PCN reaction causing immediate rash, facial/tongue/throat swelling, SOB or lightheadedness with hypotension: No Has patient had a PCN reaction causing severe rash involving mucus membranes or skin necrosis: No Has patient had a PCN reaction that required hospitalization No Has patient had a PCN reaction occurring within the last 10 years: No If  all of the above answers are "NO", then may proceed with Cephalosporin use.    Medications Reviewed Today    Reviewed by [name], [name], MD (Physician) on 02/15/21 at 1305  Med List Status: <None>  Medication Order Taking? Sig Documenting Provider Last Dose Status Informant  alendronate (FOSAMAX) 70 MG tablet [id number] Yes TAKE 1 TABLET BY MOUTH  EVERY 7 DAYS WITH A FULL  GLASS OF WATER ON AN EMPTY  STOMACH [name], [name], MD Taking Active   Apoaequorin (PREVAGEN EXTRA STRENGTH) 20 MG CAPS [id number] Yes Take 1 tablet by mouth daily.  [provider] Taking Active   b complex vitamins capsule [id number] Yes Take 1 capsule by mouth daily. [provider] Taking Active Self  Calcium-Phosphorus-Vitamin D [phone] MG-MG-UNIT CHEW [id number] Yes Chew by mouth. [provider] Taking Active   cetirizine (ZYRTEC) 10 MG tablet [id number] Yes Take 10 mg by mouth at bedtime. [provider] Taking Active Self  Cholecalciferol (VITAMIN D3) 50 MCG (2000 UT) TABS [id number] Yes Take by mouth daily. [provider] Taking Active   citalopram (CELEXA) 10 MG tablet [id number] Yes TAKE 1/2 TABLET(5 MG) BY MOUTH DAILY [name], [name], MD Taking Active   fluticasone (FLONASE) 50 MCG/ACT nasal spray [id number] Yes SHAKE LIQUID AND USE 1 SPRAY IN EACH NOSTRIL TWICE DAILY AS NEEDED FOR ALLERGIES OR RHINITIS [name], [name], MD Taking Active   glucosamine-chondroitin 500-400 MG tablet [id number] Yes Take 1 tablet by mouth 2 (two) times daily. [provider] Taking Active Self  losartan (COZAAR) 50 MG tablet [id number] Yes TAKE 1 TABLET BY MOUTH  DAILY [name], [name], MD Taking Active   meclizine (ANTIVERT) 25 MG tablet [id number] Yes TAKE 1/2 TO 1 TABLET BY  MOUTH TWICE DAILY AS NEEDED FOR DIZZINESS [name], [name], MD Taking Active   meloxicam [facility]) 15 MG tablet [id number] Yes TAKE 1 TABLET BY MOUTH  DAILY [name], [name], MD Taking Active   montelukast (SINGULAIR) 10 MG  tablet [id number] Yes TAKE 1 TABLET BY MOUTH AT  BEDTIME [name], [name], MD Taking Active   Multiple Vitamin (MULTIVITAMIN WITH MINERALS) TABS tablet [id number] Yes Take 1 tablet by mouth daily. [provider] Taking Active Self  omeprazole (PRILOSEC) 20 MG capsule [id number] Yes TAKE 1 CAPSULE BY MOUTH  DAILY [name], [name], MD Taking Active   polyethylene glycol (MIRALAX / GLYCOLAX) 17 g packet [id number] Yes Take 17 g by mouth daily. [provider] Taking Active   pravastatin (PRAVACHOL) 20 MG tablet [id number] Yes TAKE 1 TABLET BY MOUTH  DAILY [name], [name], MD Taking Active   traMADol (ULTRAM) 50 MG tablet [id number] Yes TAKE 1 TABLET(50 MG) BY MOUTH TWICE DAILY AS NEEDED [name], [name], MD Taking Active   vitamin C (VITAMIN C) 1000 MG tablet [id number] Yes Take 1 tablet (1,000 mg total) by mouth daily. Lisette Abu, PA-C Taking Active           Patient Active Problem List   Diagnosis Date Noted  . Vaginal vault prolapse 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 03/03/2020  . Cystocele, midline 03/03/2020  . Benign essential tremor 07/24/2018  . Mild recurrent major depression ([place]) 03/22/2018  . Benign paroxysmal positional vertigo due to bilateral vestibular disorder 09/27/2017  . Barton's fracture of right radius 01/25/2017  . Essential hypertension, benign 10/04/2016  . Generalized osteoarthritis of multiple sites 08/31/2016  . Chronic back pain 07/13/2016  . Hyperlipidemia 07/13/2016  . Chronic allergic rhinitis 07/13/2016  . GERD (gastroesophageal reflux disease) 07/13/2016  . Chronic pain disorder 07/13/2016    Immunization History  Administered Date(s) Administered  . Fluad Quad(high Dose 65+) 08/06/2019  . Influenza, High Dose Seasonal PF 08/31/2016, 09/07/2017, 07/24/2018  . PFIZER(Purple Top)SARS-COV-2 Vaccination 01/05/2020, 01/30/2020, 11/03/2020  . Pneumococcal Conjugate-13 01/22/2019  . Pneumococcal Polysaccharide-23 07/06/2020  . Tdap 11/16/2015  . Zoster  Recombinat (Shingrix) 11/25/2020    Conditions to be addressed/monitored:  Hypertension, Hyperlipidemia, GERD, Anxiety, Osteopenia, Osteoarthritis and Allergic Rhinitis   Patient Care Plan: CCM Pharmacy Care Plan    Problem Identified: Problem: HTN, HLD, allergic rhinitis, anxiety, GERD, osteopenia,  pain/OA     Long-Range Goal: Patient-Specific Goal   Start Date: 12/03/2020  Expected End Date: 12/03/2021  Recent Progress: On track  Priority: High  Note:   Current Barriers:  . Unable to independently monitor therapeutic efficacy  Pharmacist Clinical Goal(s):  Marland Kitchen Over the next 180 days, patient will achieve adherence to monitoring guidelines and medication adherence to achieve therapeutic efficacy through collaboration with PharmD and provider.   Interventions: . 1:1 collaboration with [name], [name], MD regarding development and update of comprehensive plan of care as evidenced by provider attestation and co-signature . Inter-disciplinary care team collaboration (see longitudinal plan of care) . Comprehensive medication review performed; medication list updated in electronic medical record  Hypertension (BP goal <140/90) -controlled -Current treatment: . Losartan 50 mg 1 tablet daily -Medications previously tried: none  -Current home readings: 135/70, highest was 151/80 (got arm cuff 2 weeks ago) -Current dietary habits: does not currently add salt to foods but does eat a lot of prepackaged foods -Current exercise habits: no structured exercise but does stand on feet during work hours -Denies hypotensive/hypertensive symptoms -Educated on Exercise goal of 150 minutes per week; Importance of home blood pressure monitoring; -Counseled to monitor BP at home weekly, document, and provide log at future appointments -Counseled on diet and exercise extensively Recommended to continue current medication  Hyperlipidemia: (LDL goal < 100) -controlled -Current treatment: . Pravastatin  20 mg 1 tablet daily -Medications previously tried: none  -Current dietary patterns: eats a lot of prepackaged foods -Current exercise habits: no structured exercise but does stand on feet during work hours -Educated on Cholesterol goals;  Exercise goal of 150 minutes per week; -Counseled on diet and exercise extensively Recommended to continue current medication  Allergic rhinitis (Goal: minimize symptoms of allergies) -controlled -Current treatment  . Flonase 50 mcg/act nasal spray 1 spray in both nostrils at bedtime . Montelukast 10 mg 1 tablet at bedtime . Cetirizine 10 mg 1 tablet at bedtime -Medications previously tried: none  -Recommended to continue current medication Recommended for patient to discuss with Dr. [name] about stopping montelukast without benefit. Patient will trial without it for a week to see if it has been helping.   Anxiety (Goal: minimize symptoms of anxiety) -controlled -Current treatment: . Citalopram 10 mg 1/2 tablet daily -Medications previously tried/failed: none -PHQ9: 2 -GAD7: 2 -Educated on Benefits of medication for symptom control -Recommended to continue current medication   GERD (Goal: minimize symptoms of heartburn or acid reflux) -controlled -Current treatment  . Omeprazole 20 mg 1 capsule daily -Medications previously tried: none  -Counseled on long term risks of taking PPIs and rebound acid reflux with tapering Educated on non-pharmacologic management of symptoms such as elevating the head of your bed, avoiding eating 2-3 hours before bed, avoiding triggering foods such as acidic, spicy, or fatty foods, eating smaller meals, and wearing clothes that are loose around the waist  Osteopenia (Goal improve bone density and prevent fractures) -controlled -Last DEXA Scan:  06/05/2019  T-Score femoral neck: -2.4 (total -1.9)  T-Score total hip: n/a  T-Score lumbar spine: n/a  T-Score forearm radius: -1.3  10-year probability of major  osteoporotic fracture: n/a  10-year probability of hip fracture: n/a -Patient is not a candidate for pharmacologic treatment -Current treatment  . Vitamin D 2000 units 1 tablet daily . Calcium 500 mg daily  . Alendronate 70 mg tablet (Sundays) -Medications previously tried: none  -Recommend [phone] units of vitamin D daily. Recommend 1200 mg of calcium daily  from dietary and supplemental sources. Counseled on oral bisphosphonate administration: take in the morning, 30 minutes prior to food with 6-8 oz of water. Do not lie down for at least 30 minutes after taking. Recommend weight-bearing and muscle strengthening exercises for building and maintaining bone density. -Recommended to continue current medication  Pain (Goal: minimize symptoms of ppain) -controlled -Current treatment  . Meloxicam 15 mg 1 tablet daily . Tramadol 50 mg 1 tablet twice daily as needed -Medications previously tried: none  -Recommended to continue current medication Counseled on preference of Tylenol over NSAIDs to protect kidneys and to minimize risk of bleeding   Health Maintenance -Vaccine gaps: influenza -Current therapy:  . Prevagen 20 mg 1 capsule daily . Vitamin B complex 1 capsule daily . Glucosamine-chondroitin 500-400 mg tablet 1 tablet twice daily . Super energy herbal complex daily . Multivitamin 1 tablet daily  . Vitamin C 1000 mg 1 tablet daily . Meclizine 25 mg 1/2 tablet PRN . Probiotics and multivitamin 2 in the morning and 2 at night . Balance advanced proactive hormone support 2 in AM and 2 in PM . Elderberry daily . Vitamin B12 daily . Zinc 50 mg 1 tablet daily -Educated on Herbal supplement research is limited and benefits usually cannot be proven Cost vs benefit of each product must be carefully weighed by individual consumer Supplements may interfere with prescription drugs -Patient is satisfied with current therapy and denies issues -Recommended stopping supplementation with  Prevagen due to lack of benefit Educated on preventative measure to take that are protective of brain including limiting sugar and dairy products (inflammatory foods) and increasing exercise  Patient Goals/Self-Care Activities . Over the next 180 days, patient will:  - take medications as prescribed check blood pressure weekly, document, and provide at future appointments  Follow Up Plan: Telephone follow up appointment with care management team member scheduled for: 6 months     Medication Assistance: None required.  Patient affirms current coverage meets needs.  Patient's preferred pharmacy is:  [id number] [address], [address] [place] [address] Phone: [phone] Fax: [place], [address], [address] [place], [address] [place] [address] Phone: [phone] Fax: [phone]  Uses pill box? Yes  Pt endorses 95% compliance  We discussed: Current pharmacy is preferred with insurance plan and patient is satisfied with pharmacy services Patient decided to: Continue current medication management strategy  Care Plan and Follow Up Patient Decision:  Patient agrees to Care Plan and Follow-up.  Plan: Telephone follow up appointment with care management team member scheduled for:  6 months  Jeni Salles, PharmD [place] Pharmacist [place] at [place] [phone]

## 2021-03-13 DIAGNOSIS — Z961 Presence of intraocular lens: Secondary | ICD-10-CM | POA: Diagnosis not present

## 2021-03-13 DIAGNOSIS — H524 Presbyopia: Secondary | ICD-10-CM | POA: Diagnosis not present

## 2021-03-20 ENCOUNTER — Other Ambulatory Visit: Payer: Self-pay | Admitting: Family Medicine

## 2021-03-20 DIAGNOSIS — G894 Chronic pain syndrome: Secondary | ICD-10-CM

## 2021-03-20 DIAGNOSIS — M159 Polyosteoarthritis, unspecified: Secondary | ICD-10-CM

## 2021-03-23 NOTE — Telephone Encounter (Signed)
Last filled 02/12/21, last OV 02/11/21, next OV 07/2021. Can you refill in pcp's absence?

## 2021-04-14 ENCOUNTER — Other Ambulatory Visit: Payer: Self-pay | Admitting: Family Medicine

## 2021-04-14 DIAGNOSIS — Z1231 Encounter for screening mammogram for malignant neoplasm of breast: Secondary | ICD-10-CM

## 2021-04-17 ENCOUNTER — Telehealth: Payer: Self-pay | Admitting: Family Medicine

## 2021-04-17 DIAGNOSIS — G894 Chronic pain syndrome: Secondary | ICD-10-CM

## 2021-04-17 DIAGNOSIS — M159 Polyosteoarthritis, unspecified: Secondary | ICD-10-CM

## 2021-04-21 ENCOUNTER — Telehealth: Payer: Self-pay

## 2021-04-21 NOTE — Chronic Care Management (AMB) (Signed)
Chronic Care Management Pharmacy Assistant   Name: Tabitha Adams  MRN: [id number] DOB: 11-13-1946  Reason for Encounter: Disease State   Conditions to be addressed/monitored: HTN  Recent office visits:  None  Recent consult visits:  None  Hospital visits:  None in previous 6 months  Medications: Outpatient Encounter Medications as of 04/21/2021  Medication Sig   alendronate (FOSAMAX) 70 MG tablet TAKE 1 TABLET BY MOUTH  EVERY 7 DAYS WITH A FULL  GLASS OF WATER ON AN EMPTY  STOMACH   Apoaequorin (PREVAGEN EXTRA STRENGTH) 20 MG CAPS Take 1 tablet by mouth daily.   b complex vitamins capsule Take 1 capsule by mouth daily.   Calcium-Phosphorus-Vitamin D [phone] MG-MG-UNIT CHEW Chew by mouth.   cetirizine (ZYRTEC) 10 MG tablet Take 10 mg by mouth at bedtime.   Cholecalciferol (VITAMIN D3) 50 MCG (2000 UT) TABS Take by mouth daily.   citalopram (CELEXA) 10 MG tablet TAKE 1/2 TABLET(5 MG) BY MOUTH DAILY   fluticasone (FLONASE) 50 MCG/ACT nasal spray SHAKE LIQUID AND USE 1 SPRAY IN EACH NOSTRIL TWICE DAILY AS NEEDED FOR ALLERGIES OR RHINITIS   glucosamine-chondroitin 500-400 MG tablet Take 1 tablet by mouth 2 (two) times daily.   losartan (COZAAR) 50 MG tablet TAKE 1 TABLET BY MOUTH  DAILY   meclizine (ANTIVERT) 25 MG tablet TAKE 1/2 TO 1 TABLET BY  MOUTH TWICE DAILY AS NEEDED FOR DIZZINESS   meloxicam (MOBIC) 15 MG tablet TAKE 1 TABLET BY MOUTH  DAILY   montelukast (SINGULAIR) 10 MG tablet TAKE 1 TABLET BY MOUTH AT  BEDTIME   Multiple Vitamin (MULTIVITAMIN WITH MINERALS) TABS tablet Take 1 tablet by mouth daily.   omeprazole (PRILOSEC) 20 MG capsule TAKE 1 CAPSULE BY MOUTH  DAILY   polyethylene glycol (MIRALAX / GLYCOLAX) 17 g packet Take 17 g by mouth daily.   pravastatin (PRAVACHOL) 20 MG tablet TAKE 1 TABLET BY MOUTH  DAILY   traMADol (ULTRAM) 50 MG tablet TAKE 1 TABLET(50 MG) BY MOUTH TWICE DAILY AS NEEDED   vitamin C (VITAMIN C) 1000 MG tablet Take 1 tablet (1,000  mg total) by mouth daily.   No facility-administered encounter medications on file as of 04/21/2021.   Reviewed chart prior to disease state call. Spoke with patient regarding BP  Recent Office Vitals: BP Readings from Last 3 Encounters:  02/11/21 130/70  09/01/20 130/65  08/06/20 122/80   Pulse Readings from Last 3 Encounters:  02/11/21 93  09/01/20 95  08/06/20 82    Wt Readings from Last 3 Encounters:  02/11/21 144 lb 2 oz (65.4 kg)  09/01/20 142 lb (64.4 kg)  08/06/20 143 lb 2 oz (64.9 kg)     Kidney Function Lab Results  Component Value Date/Time   CREATININE 0.79 02/24/2021 07:52 AM   CREATININE 0.72 08/06/2020 03:34 PM   CREATININE 0.83 02/04/2020 04:13 PM   GFR 73.37 02/24/2021 07:52 AM   GFRNONAA 82 08/06/2020 03:34 PM   GFRAA 96 08/06/2020 03:34 PM    BMP Latest Ref Rng & Units 02/24/2021 08/06/2020 02/04/2020  Glucose 70 - 99 mg/dL 84 115(H) 88  BUN 6 - 23 mg/dL 15 13 20   Creatinine 0.40 - 1.20 mg/dL 0.79 0.72 0.83  BUN/Creat Ratio 6 - 22 (calc) - NOT APPLICABLE -  Sodium 573 - 145 mEq/L 134(L) 137 134(L)  Potassium 3.5 - 5.1 mEq/L 4.4 4.1 4.2  Chloride 96 - 112 mEq/L 98 103 100  CO2 19 - 32 mEq/L 28 29  27  Calcium 8.4 - 10.5 mg/dL 9.9 9.3 9.7   Current antihypertensive regimen:  Losartan 50 mg one table daily How often are you checking your Blood Pressure? Unknown Current home BP readings: None What recent interventions/DTPs have been made by any provider to improve Blood Pressure control since last CPP Visit:  Any recent hospitalizations or ED visits since last visit with CPP? No What diet changes have been made to improve Blood Pressure Control?  No Change What exercise is being done to improve your Blood Pressure Control?  No Change  Adherence Review: Is the patient currently on ACE/ARB medication? Yes Does the patient have >5 day gap between last estimated fill dates? No  06.15.2022 I called to speak with the patient about medication inherence.  She stated that she was driving at the time and that she will call back with her blood pressure readings.  06.20.2022 third attempt to reach the patient about her blood pressure readings. I left her a message no return call yet.   Star Rating Drugs: Medication Dispensed  Quantity Pharmacy  Losartan 50 mg 04.21.2022 90 [facility]  Pravastatin 20 mg 04.21.2022 90 [facility]    Tomie Elko (Revonda Standard, [place] Pharmacist Assistant [phone]

## 2021-04-23 NOTE — Telephone Encounter (Signed)
Patient is calling back to check the refill status for Tramadol and is requesting a call back, please advise. CB [phone]

## 2021-04-24 NOTE — Telephone Encounter (Signed)
I spoke with pharmacy, Rx was on hold and will be filled today. Message sent to patient letting her know. Pharmacy will notify pt once Rx is ready.

## 2021-05-19 ENCOUNTER — Encounter: Payer: Self-pay | Admitting: Gastroenterology

## 2021-05-19 ENCOUNTER — Ambulatory Visit: Payer: Medicare Other | Admitting: Gastroenterology

## 2021-05-19 VITALS — BP 130/70 | HR 76 | Ht 59.0 in | Wt 138.6 lb

## 2021-05-19 DIAGNOSIS — K219 Gastro-esophageal reflux disease without esophagitis: Secondary | ICD-10-CM

## 2021-05-19 DIAGNOSIS — K5904 Chronic idiopathic constipation: Secondary | ICD-10-CM | POA: Diagnosis not present

## 2021-05-19 DIAGNOSIS — M1711 Unilateral primary osteoarthritis, right knee: Secondary | ICD-10-CM | POA: Diagnosis not present

## 2021-05-19 DIAGNOSIS — Z1211 Encounter for screening for malignant neoplasm of colon: Secondary | ICD-10-CM

## 2021-05-19 DIAGNOSIS — D509 Iron deficiency anemia, unspecified: Secondary | ICD-10-CM

## 2021-05-19 NOTE — Patient Instructions (Signed)
We will obtain your previous GI records from [place], [place].  Please start over the counter gas-x four times a day as needed.   You have been given a low gas diet.   You will be due for a recall colonoscopy in 05/2026. We will send you a reminder in the mail when it gets closer to that time.  Normal BMI (Body Mass Index- based on height and weight) is between 23 and 30. Your BMI today is Body mass index is 27.99 kg/m. Marland Kitchen Please consider follow up  regarding your BMI with your Primary Care Provider.  The [facility] providers would like to encourage you to use [facility] to communicate with providers for non-urgent requests or questions.  Due to long hold times on the telephone, sending your provider a message by [facility] may be a faster and more efficient way to get a response.  Please allow 48 business hours for a response.  Please remember that this is for non-urgent requests.    Thank you for choosing me and [facility].  Pricilla Riffle. Dagoberto Ligas., MD., Marval Regal

## 2021-05-19 NOTE — Progress Notes (Signed)
History of Present Illness: This is a 75 year old female referred by [name], [name], MD for CRC screening, constipation, GERD. Epic review shows 2 office visits with Huntley Estelle, MD in 2017 for IDA and constipation however the office notes and colonoscopy are not available in Epic.  The patient relates she had a colonoscopy in 2017 by Dr. Rock Nephew in [place], [place] that was normal.  She relates that she underwent colonoscopy and EGD around 2000 - 2002 for evaluation of IDA and both exams were negative.  She takes iron intermittently and recently has been taking it once daily.  Recent CBC with Hct=35.3 and Hgb=12.0.  Chronic constipation is controlled with daily MiraLAX.  Reflux symptoms are well controlled with daily omeprazole. Denies weight loss, abdominal pain, diarrhea, change in stool caliber, melena, hematochezia, nausea, vomiting, dysphagia, chest pain.     Allergies  Allergen Reactions   Cymbalta [Duloxetine Hcl] Nausea Only    Adverse reaction caused dizziness and nausea   Elemental Sulfur Swelling and Other (See Comments)    Reaction:  All over body swelling    Penicillins Rash and Other (See Comments)    Has patient had a PCN reaction causing immediate rash, facial/tongue/throat swelling, SOB or lightheadedness with hypotension: No Has patient had a PCN reaction causing severe rash involving mucus membranes or skin necrosis: No Has patient had a PCN reaction that required hospitalization No Has patient had a PCN reaction occurring within the last 10 years: No If all of the above answers are "NO", then may proceed with Cephalosporin use.   Outpatient Medications Prior to Visit  Medication Sig Dispense Refill   alendronate (FOSAMAX) 70 MG tablet TAKE 1 TABLET BY MOUTH  EVERY 7 DAYS WITH A FULL  GLASS OF WATER ON AN EMPTY  STOMACH 12 tablet 3   Apoaequorin (PREVAGEN EXTRA STRENGTH) 20 MG CAPS Take 1 tablet by mouth daily.     b complex vitamins capsule Take 1 capsule by mouth  daily.     Calcium-Phosphorus-Vitamin D [id number] MG-MG-UNIT CHEW Chew by mouth.     cetirizine (ZYRTEC) 10 MG tablet Take 10 mg by mouth at bedtime.     Cholecalciferol (VITAMIN D3) 50 MCG (2000 UT) TABS Take by mouth daily.     citalopram (CELEXA) 10 MG tablet TAKE 1/2 TABLET(5 MG) BY MOUTH DAILY 45 tablet 3   fluticasone (FLONASE) 50 MCG/ACT nasal spray SHAKE LIQUID AND USE 1 SPRAY IN EACH NOSTRIL TWICE DAILY AS NEEDED FOR ALLERGIES OR RHINITIS 16 g 11   glucosamine-chondroitin 500-400 MG tablet Take 1 tablet by mouth 2 (two) times daily.     losartan (COZAAR) 50 MG tablet TAKE 1 TABLET BY MOUTH  DAILY 90 tablet 3   meclizine (ANTIVERT) 25 MG tablet TAKE 1/2 TO 1 TABLET BY  MOUTH TWICE DAILY AS NEEDED FOR DIZZINESS 90 tablet 1   meloxicam (MOBIC) 15 MG tablet TAKE 1 TABLET BY MOUTH  DAILY 90 tablet 3   montelukast (SINGULAIR) 10 MG tablet TAKE 1 TABLET BY MOUTH AT  BEDTIME 90 tablet 3   Multiple Vitamin (MULTIVITAMIN WITH MINERALS) TABS tablet Take 1 tablet by mouth daily.     omeprazole (PRILOSEC) 20 MG capsule TAKE 1 CAPSULE BY MOUTH  DAILY 90 capsule 3   polyethylene glycol (MIRALAX / GLYCOLAX) 17 g packet Take 17 g by mouth daily.     pravastatin (PRAVACHOL) 20 MG tablet TAKE 1 TABLET BY MOUTH  DAILY 90 tablet 3   traMADol (  ULTRAM) 50 MG tablet TAKE 1 TABLET(50 MG) BY MOUTH TWICE DAILY AS NEEDED 60 tablet 3   vitamin C (VITAMIN C) 1000 MG tablet Take 1 tablet (1,000 mg total) by mouth daily. 30 tablet 0   No facility-administered medications prior to visit.   Past Medical History:  Diagnosis Date   Allergy    Anemia    Arthritis    GERD (gastroesophageal reflux disease)    Hyperlipidemia    Past Surgical History:  Procedure Laterality Date   APPENDECTOMY     BACK SURGERY     BREAST CYST ASPIRATION Left    BREAST EXCISIONAL BIOPSY Left    BREAST EXCISIONAL BIOPSY Left    CATARACT EXTRACTION, BILATERAL     ORIF WRIST FRACTURE Right 01/25/2017   Procedure: OPEN REDUCTION  INTERNAL FIXATION (ORIF) WRIST FRACTURE;  Surgeon: Roseanne Kaufman, MD;  Location: [facility];  Service: Orthopedics;  Laterality: Right;   TOTAL ABDOMINAL HYSTERECTOMY     heavy periods   Social History   Socioeconomic History   Marital status: Widowed    Spouse name: Not on file   Number of children: 3   Years of education: Not on file   Highest education level: Not on file  Occupational History   Occupation: Surveyor, quantity: [employer]    Comment: works 25-30hrs/week  Tobacco Use   Smoking status: Former    Pack years: 0.00   Smokeless tobacco: Never  Vaping Use   Vaping Use: Never used  Substance and Sexual Activity   Alcohol use: No   Drug use: No   Sexual activity: Not Currently    Comment: 1st intercourse 75 yo-Fewer than 5 partners  Other Topics Concern   Not on file  Social History Narrative   01/22/2019:   Widowed since 10/2017; lives alone now in [place] house with 2 dogs   Has one son, and one stepson and Psychiatrist. Sons live close by, supportive.   Has 5 grandchildren and several great grandchildren, active in their lives   Continues to work PT as Scientist, water quality, likes to stay busy   [employer] community support system   Social Determinants of Radio broadcast assistant Strain: Low Risk    Difficulty of Paying Living Expenses: Not hard at all  Food Insecurity: Not on file  Transportation Needs: No Transportation Needs   Lack of Transportation (Medical): No   Lack of Transportation (Non-Medical): No  Physical Activity: Not on file  Stress: Not on file  Social Connections: Not on file   Family History  Problem Relation Age of Onset   Other Mother        brain tumor, non cancerous   Stroke Father    Hypertension Father    Dementia Father    Arthritis Sister    Hypercholesterolemia Sister    Epilepsy Son    Heart Problems Son    Other Son        breathing problems   Tremor Son    Other Brother        MVA        Review of Systems: Pertinent positive  and negative review of systems were noted in the above HPI section. All other review of systems were otherwise negative.   Physical Exam: General: Well developed, well nourished, no acute distress Head: Normocephalic and atraumatic Eyes: Sclerae anicteric, EOMI Ears: Normal auditory acuity Mouth: Not examined, mask on during Covid-19 pandemic Neck: Supple, no masses or thyromegaly Lungs: Clear throughout  to auscultation Heart: Regular rate and rhythm; no murmurs, rubs or bruits Abdomen: Soft, non tender and non distended. No masses, hepatosplenomegaly or hernias noted. Normal Bowel sounds Rectal: Not done Musculoskeletal: Symmetrical with no gross deformities  Skin: No lesions on visible extremities Pulses:  Normal pulses noted Extremities: No clubbing, cyanosis, edema or deformities noted Neurological: Alert oriented x 4, grossly nonfocal Cervical Nodes:  No significant cervical adenopathy Inguinal Nodes: No significant inguinal adenopathy Psychological:  Alert and cooperative. Normal mood and affect   Assessment and Recommendations:  CRC screening, average risk.  Request colonoscopy and office records from Dr. Rock Nephew' office.  Given she had a normal colonoscopy in 2017 (by her report) and no additional risk factors, sign or symptoms she does not need a colonoscopy at this time and we can consider colonoscopy in 5 years at age 45 vs. no repeat colonoscopy due to age.  We will reassess if and when records are received.  2.   Chronic constipation.  Controlled with MiraLAX qd.  3.   IDA, recurrent, etiology unclear. Hgb=12.0. Continue FeSO4 325 mg qd.  If new GI symptoms develop or her anemia is not adequately controlled with iron supplements consider EGD, colonoscopy and if both are negative consider VCE.  Follow up with PCP.   4.  GERD. Follow antireflux measures. Continue omeprazole 20 mg po qd.   5.  Gas. Gas-X po qid prn.    cc: [name], [name], [place]  [place],  [place] [address]

## 2021-06-09 ENCOUNTER — Ambulatory Visit
Admission: RE | Admit: 2021-06-09 | Discharge: 2021-06-09 | Disposition: A | Discharge status: Home / Self Care | Payer: Medicare Other | Source: Ambulatory Visit | Attending: Family Medicine | Admitting: Family Medicine

## 2021-06-09 ENCOUNTER — Other Ambulatory Visit: Payer: Self-pay

## 2021-06-09 DIAGNOSIS — Z1231 Encounter for screening mammogram for malignant neoplasm of breast: Secondary | ICD-10-CM

## 2021-06-24 ENCOUNTER — Telehealth: Payer: Self-pay | Admitting: Pharmacist

## 2021-06-24 NOTE — Chronic Care Management (AMB) (Signed)
Chronic Care Management Pharmacy Assistant   Name: Tabitha Adams  MRN: [id number] DOB: 1946/09/28  Reason for Encounter: Disease State / Hypertension Assessment Call   Conditions to be addressed/monitored: HTN  Recent office visits:  None  Recent consult visits:  06-09-2021 [name] [name] MD - Patient presented to [facility] for Mammogram.  05-19-2021 Ladene Artist, MD (Gastroenterology) - Patient presented for Chronic idiopathic constipation and other concerns. No medication changes.    Hospital visits:  None in previous 6 months  Medications: Outpatient Encounter Medications as of 06/24/2021  Medication Sig   alendronate (FOSAMAX) 70 MG tablet TAKE 1 TABLET BY MOUTH  EVERY 7 DAYS WITH A FULL  GLASS OF WATER ON AN EMPTY  STOMACH   Apoaequorin (PREVAGEN EXTRA STRENGTH) 20 MG CAPS Take 1 tablet by mouth daily.   b complex vitamins capsule Take 1 capsule by mouth daily.   Calcium-Phosphorus-Vitamin D [id number] MG-MG-UNIT CHEW Chew by mouth.   cetirizine (ZYRTEC) 10 MG tablet Take 10 mg by mouth at bedtime.   Cholecalciferol (VITAMIN D3) 50 MCG (2000 UT) TABS Take by mouth daily.   citalopram (CELEXA) 10 MG tablet TAKE 1/2 TABLET(5 MG) BY MOUTH DAILY   ferrous sulfate 325 (65 FE) MG tablet Take 325 mg by mouth daily with breakfast.   fluticasone (FLONASE) 50 MCG/ACT nasal spray SHAKE LIQUID AND USE 1 SPRAY IN EACH NOSTRIL TWICE DAILY AS NEEDED FOR ALLERGIES OR RHINITIS   glucosamine-chondroitin 500-400 MG tablet Take 1 tablet by mouth 2 (two) times daily.   losartan (COZAAR) 50 MG tablet TAKE 1 TABLET BY MOUTH  DAILY   meclizine (ANTIVERT) 25 MG tablet TAKE 1/2 TO 1 TABLET BY  MOUTH TWICE DAILY AS NEEDED FOR DIZZINESS   meloxicam (MOBIC) 15 MG tablet TAKE 1 TABLET BY MOUTH  DAILY   montelukast (SINGULAIR) 10 MG tablet TAKE 1 TABLET BY MOUTH AT  BEDTIME   Multiple Vitamin (MULTIVITAMIN WITH MINERALS) TABS tablet Take 1 tablet by mouth daily.   omeprazole  (PRILOSEC) 20 MG capsule TAKE 1 CAPSULE BY MOUTH  DAILY   polyethylene glycol (MIRALAX / GLYCOLAX) 17 g packet Take 17 g by mouth daily.   pravastatin (PRAVACHOL) 20 MG tablet TAKE 1 TABLET BY MOUTH  DAILY   traMADol (ULTRAM) 50 MG tablet TAKE 1 TABLET(50 MG) BY MOUTH TWICE DAILY AS NEEDED   vitamin C (VITAMIN C) 1000 MG tablet Take 1 tablet (1,000 mg total) by mouth daily.   No facility-administered encounter medications on file as of 06/24/2021.  Reviewed chart prior to disease state call. Spoke with patient regarding BP  Recent Office Vitals: BP Readings from Last 3 Encounters:  05/19/21 130/70  02/11/21 130/70  09/01/20 130/65   Pulse Readings from Last 3 Encounters:  05/19/21 76  02/11/21 93  09/01/20 95    Wt Readings from Last 3 Encounters:  05/19/21 138 lb 9.6 oz (62.9 kg)  02/11/21 144 lb 2 oz (65.4 kg)  09/01/20 142 lb (64.4 kg)     Kidney Function Lab Results  Component Value Date/Time   CREATININE 0.79 02/24/2021 07:52 AM   CREATININE 0.72 08/06/2020 03:34 PM   CREATININE 0.83 02/04/2020 04:13 PM   GFR 73.37 02/24/2021 07:52 AM   GFRNONAA 82 08/06/2020 03:34 PM   GFRAA 96 08/06/2020 03:34 PM    BMP Latest Ref Rng & Units 02/24/2021 08/06/2020 02/04/2020  Glucose 70 - 99 mg/dL 84 115(H) 88  BUN 6 - 23 mg/dL 15 13 20  Creatinine 0.40 - 1.20 mg/dL 0.79 0.72 0.83  BUN/Creat Ratio 6 - 22 (calc) - NOT APPLICABLE -  Sodium [id number] - 145 mEq/L 134(L) 137 134(L)  Potassium 3.5 - 5.1 mEq/L 4.4 4.1 4.2  Chloride 96 - 112 mEq/L 98 103 100  CO2 19 - 32 mEq/L '28 29 27  '$ Calcium 8.4 - 10.5 mg/dL 9.9 9.3 9.7    Current antihypertensive regimen:  Losartan 50 mg 1 tablet daily How often are you checking your Blood Pressure? 1-2x per week Per patient she is checking in the evenings. Current home BP readings: 129/70 was her most recent and is usually within that range for her readings. What recent interventions/DTPs have been made by any provider to improve Blood Pressure control  since last CPP Visit: Patient reports no changes or interventions.  Any recent hospitalizations or ED visits since last visit with CPP? No Patient reports no. What diet changes have been made to improve Blood Pressure Control?  Patient reports that she does not eat very much and has very light meals. For breakfast she will have a bagel and cream cheese. For lunch she typically has a yogurt. For dinner she eats some type of chicken she reports she stays away from beef as she has noticed it tends to make her gassy. She tries to eat vegetables, her neighbor has a garden and she enjoys the vegetables more when they are fresh and in season has had many recently. She reports she drinks water as her main choice of beverage. What exercise is being done to improve your Blood Pressure Control?  She reports she is very active and gets around very well. She currently is working anywhere from 20 to 25 hours a week and that keeps her busy.  Patient Confirmed she is taking the above medication for Hypertension as prescribed and is not in need of any fills at the moment on it or of any medications. No other concerns or issues to report at this time. Confirmed upcoming follow up appointment with Clinical Pharmacist she advised that she may have to change due to her working schedule and will let us know if that is the case closer to the date.   Adherence Review: Is the patient currently on ACE/ARB medication? Yes Does the patient have >5 day gap between last estimated fill dates? No   Care Gaps: CCM F /U Call - Scheduled 09-01-21 at 3:30 pm AWV - MSG sent to Ramond Craver CMA to schedule Zoster Vaccine - Overdue COVID Booster #4 [facility]) - Overdue Flu Vaccine - Overdue  Star Rating Drugs: Losartan (Cozaar) 50 mg - Last filled 06-05-2021 90 DS at [facility] Pravastatin (Pravachol) 20 mg - Last filled 06-05-2021 90 DS at [facility] Pharmacist Assistant [phone]

## 2021-07-16 ENCOUNTER — Other Ambulatory Visit: Payer: Self-pay

## 2021-07-16 ENCOUNTER — Ambulatory Visit (INDEPENDENT_AMBULATORY_CARE_PROVIDER_SITE_OTHER): Payer: Medicare Other

## 2021-07-16 VITALS — BP 124/64 | HR 84 | Temp 98.0°F | Ht <= 58 in | Wt 139.0 lb

## 2021-07-16 DIAGNOSIS — Z0000 Encounter for general adult medical examination without abnormal findings: Secondary | ICD-10-CM

## 2021-07-16 NOTE — Progress Notes (Signed)
Subjective:   Lisamarie Debois is a 75 y.o. female who presents for Medicare Annual (Subsequent) preventive examination.  Review of Systems    N/a       Objective:    There were no vitals filed for this visit. There is no height or weight on file to calculate BMI.  Advanced Directives 09/01/2020 01/22/2019 01/25/2017 12/02/2016  Does Patient Have a Medical Advance Directive? Yes Yes No Yes  Type of Paramedic of [place];Living will [place];Living will - [place];Living will  Copy of [place] in Chart? - No - copy requested - -  Would patient like information on creating a medical advance directive? - - No - Patient declined -    Current Medications (verified) Outpatient Encounter Medications as of 07/16/2021  Medication Sig   alendronate (FOSAMAX) 70 MG tablet TAKE 1 TABLET BY MOUTH  EVERY 7 DAYS WITH A FULL  GLASS OF WATER ON AN EMPTY  STOMACH   Apoaequorin (PREVAGEN EXTRA STRENGTH) 20 MG CAPS Take 1 tablet by mouth daily.   b complex vitamins capsule Take 1 capsule by mouth daily.   Calcium-Phosphorus-Vitamin D [id number] MG-MG-UNIT CHEW Chew by mouth.   cetirizine (ZYRTEC) 10 MG tablet Take 10 mg by mouth at bedtime.   Cholecalciferol (VITAMIN D3) 50 MCG (2000 UT) TABS Take by mouth daily.   citalopram (CELEXA) 10 MG tablet TAKE 1/2 TABLET(5 MG) BY MOUTH DAILY   ferrous sulfate 325 (65 FE) MG tablet Take 325 mg by mouth daily with breakfast.   fluticasone (FLONASE) 50 MCG/ACT nasal spray SHAKE LIQUID AND USE 1 SPRAY IN EACH NOSTRIL TWICE DAILY AS NEEDED FOR ALLERGIES OR RHINITIS   glucosamine-chondroitin 500-400 MG tablet Take 1 tablet by mouth 2 (two) times daily.   losartan (COZAAR) 50 MG tablet TAKE 1 TABLET BY MOUTH  DAILY   meclizine (ANTIVERT) 25 MG tablet TAKE 1/2 TO 1 TABLET BY  MOUTH TWICE DAILY AS NEEDED FOR DIZZINESS   meloxicam (MOBIC) 15 MG tablet TAKE 1 TABLET BY MOUTH  DAILY    montelukast (SINGULAIR) 10 MG tablet TAKE 1 TABLET BY MOUTH AT  BEDTIME   Multiple Vitamin (MULTIVITAMIN WITH MINERALS) TABS tablet Take 1 tablet by mouth daily.   omeprazole (PRILOSEC) 20 MG capsule TAKE 1 CAPSULE BY MOUTH  DAILY   polyethylene glycol (MIRALAX / GLYCOLAX) 17 g packet Take 17 g by mouth daily.   pravastatin (PRAVACHOL) 20 MG tablet TAKE 1 TABLET BY MOUTH  DAILY   traMADol (ULTRAM) 50 MG tablet TAKE 1 TABLET(50 MG) BY MOUTH TWICE DAILY AS NEEDED   vitamin C (VITAMIN C) 1000 MG tablet Take 1 tablet (1,000 mg total) by mouth daily.   No facility-administered encounter medications on file as of 07/16/2021.    Allergies (verified) Cymbalta [duloxetine hcl], Elemental sulfur, and Penicillins   History: Past Medical History:  Diagnosis Date   Allergy    Anemia    Arthritis    GERD (gastroesophageal reflux disease)    Hyperlipidemia    Past Surgical History:  Procedure Laterality Date   APPENDECTOMY     BACK SURGERY     BREAST CYST ASPIRATION Left    BREAST EXCISIONAL BIOPSY Left    BREAST EXCISIONAL BIOPSY Left    CATARACT EXTRACTION, BILATERAL     ORIF WRIST FRACTURE Right 01/25/2017   Procedure: OPEN REDUCTION INTERNAL FIXATION (ORIF) WRIST FRACTURE;  Surgeon: Roseanne Kaufman, MD;  Location: [facility];  Service:  Orthopedics;  Laterality: Right;   TOTAL ABDOMINAL HYSTERECTOMY     heavy periods   Family History  Problem Relation Age of Onset   Other Mother        brain tumor, non cancerous   Stroke Father    Hypertension Father    Dementia Father    Arthritis Sister    Hypercholesterolemia Sister    Epilepsy Son    Heart Problems Son    Other Son        breathing problems   Tremor Son    Other Brother        MVA   Social History   Socioeconomic History   Marital status: Widowed    Spouse name: Not on file   Number of children: 3   Years of education: Not on file   Highest education level: Not on file  Occupational History   Occupation: Tourist information centre manager: [employer]    Comment: works 25-30hrs/week  Tobacco Use   Smoking status: Former   Smokeless tobacco: Never  Scientific laboratory technician Use: Never used  Substance and Sexual Activity   Alcohol use: No   Drug use: No   Sexual activity: Not Currently    Comment: 1st intercourse 75 yo-Fewer than 5 partners  Other Topics Concern   Not on file  Social History Narrative   01/22/2019:   Widowed since 10/2017; lives alone now in [place] house with 2 dogs   Has one son, and one stepson and Psychiatrist. Sons live close by, supportive.   Has 5 grandchildren and several great grandchildren, active in their lives   Continues to work PT as Scientist, water quality, likes to stay busy   [employer] community support system   Social Determinants of Radio broadcast assistant Strain: Low Risk    Difficulty of Paying Living Expenses: Not hard at all  Food Insecurity: Not on file  Transportation Needs: No Transportation Needs   Lack of Transportation (Medical): No   Lack of Transportation (Non-Medical): No  Physical Activity: Not on file  Stress: Not on file  Social Connections: Not on file    Tobacco Counseling Counseling given: Not Answered   Clinical Intake:                 Diabetic?no         Activities of Daily Living In your present state of health, do you have any difficulty performing the following activities: 08/06/2020  Hearing? N  Vision? N  Difficulty concentrating or making decisions? N  Comment Occasionally she forgets names.  Walking or climbing stairs? Y  Dressing or bathing? N  Doing errands, shopping? N  Some recent data might be hidden    Patient Care Team: [name], [name], MD as PCP - General (Family Medicine) Marica Otter, [place] (Optometry) Viona Gilmore, [facility] as Pharmacist (Pharmacist)  Indicate any recent Medical Services you may have received from other than [facility] providers in the past year (date may be approximate).     Assessment:   This is a routine  wellness examination for [place].  Hearing/Vision screen No results found.  Dietary issues and exercise activities discussed:     Goals Addressed   None    Depression Screen PHQ 2/9 Scores 08/06/2020 08/06/2020 02/04/2020 08/09/2019 01/22/2019  PHQ - 2 Score 0 0 '1 2 1  '$ PHQ- 9 Score 2 - '1 5 2    '$ Fall Risk Fall Risk  09/01/2020 08/06/2020 08/09/2019 01/22/2019 10/03/2018  Falls in the past year? 1 1 0 0 1  Comment - - - - Emmi Telephone Survey: data to providers prior to load  Number falls in past yr: 1 0 0 - 1  Comment - - - - Emmi Telephone Survey Actual Response = 4  Injury with Fall? 0 0 0 - 0  Risk for fall due to : - Orthopedic patient;Medication side effect Orthopedic patient History of fall(s);Impaired balance/gait -  Follow up - Education provided Education provided Education provided;Falls prevention discussed -    FALL RISK PREVENTION PERTAINING TO THE HOME:  Any stairs in or around the home? Yes  If so, are there any without handrails? No  Home free of loose throw rugs in walkways, pet beds, electrical cords, etc? Yes  Adequate lighting in your home to reduce risk of falls? Yes   ASSISTIVE DEVICES UTILIZED TO PREVENT FALLS:  Life alert? No  Use of a cane, walker or w/c? No  Grab bars in the bathroom? Yes  Shower chair or bench in shower? No  Elevated toilet seat or a handicapped toilet? No   TIMED UP AND GO:  Was the test performed? Yes .  Length of time to ambulate 10 feet: 10 sec.   Gait steady and fast without use of assistive device  Cognitive Function:    Normal cognitive status assessed by direct observation by this Nurse Health Advisor. No abnormalities found.      Immunizations Immunization History  Administered Date(s) Administered   Fluad Quad(high Dose 65+) 08/06/2019   Influenza, High Dose Seasonal PF 08/31/2016, 09/07/2017, 07/24/2018   PFIZER(Purple Top)SARS-COV-2 Vaccination 01/05/2020, 01/30/2020, 11/03/2020   Pneumococcal Conjugate-13  01/22/2019   Pneumococcal Polysaccharide-23 07/06/2020   Tdap 11/16/2015   Zoster Recombinat (Shingrix) 11/25/2020    TDAP status: Up to date  Flu Vaccine status: Up to date  Pneumococcal vaccine status: Up to date  Covid-19 vaccine status: Completed vaccines  Qualifies for Shingles Vaccine? Yes   Zostavax completed No   Shingrix Completed?: No.    Education has been provided regarding the importance of this vaccine. Patient has been advised to call insurance company to determine out of pocket expense if they have not yet received this vaccine. Advised may also receive vaccine at local pharmacy or Health Dept. Verbalized acceptance and understanding.  Screening Tests Health Maintenance  Topic Date Due   Zoster Vaccines- Shingrix (2 of 2) 01/20/2021   COVID-19 Vaccine (4 - Booster for Pfizer series) 03/04/2021   INFLUENZA VACCINE  06/15/2021   TETANUS/TDAP  11/15/2025   COLONOSCOPY (Pts 45-23yr Insurance coverage will need to be confirmed)  04/16/2026   DEXA SCAN  Completed   Hepatitis C Screening  Completed   HPV VACCINES  Aged Out   PNA vac Low Risk Adult  Discontinued    Health Maintenance  Health Maintenance Due  Topic Date Due   Zoster Vaccines- Shingrix (2 of 2) 01/20/2021   COVID-19 Vaccine (4 - Booster for Pfizer series) 03/04/2021   INFLUENZA VACCINE  06/15/2021    Colorectal cancer screening: Type of screening: Colonoscopy. Completed 04/16/2016. Repeat every 10 years  Mammogram status: Completed 06/09/2021. Repeat every year  Bone Density status: Completed 06/05/2019. Results reflect: Bone density results: OSTEOPENIA. Repeat every 5 years.  Lung Cancer Screening: (Low Dose CT Chest recommended if Age 75-80years, 30 pack-year currently smoking OR have quit w/in 15years.) does not qualify.   Lung Cancer Screening Referral: n/a  Additional Screening:  Hepatitis C Screening: does not  qualify; Completed 06/11/2016  Vision Screening: Recommended annual  ophthalmology exams for early detection of glaucoma and other disorders of the eye. Is the patient up to date with their annual eye exam?  Yes  Who is the provider or what is the name of the office in which the patient attends annual eye exams? Dr.Miller  If pt is not established with a provider, would they like to be referred to a provider to establish care? No .   Dental Screening: Recommended annual dental exams for proper oral hygiene  Community Resource Referral / Chronic Care Management: CRR required this visit?  No   CCM required this visit?  No      Plan:     I have personally reviewed and noted the following in the patient's chart:   Medical and social history Use of alcohol, tobacco or illicit drugs  Current medications and supplements including opioid prescriptions.  Functional ability and status Nutritional status Physical activity Advanced directives List of other physicians Hospitalizations, surgeries, and ER visits in previous 12 months Vitals Screenings to include cognitive, depression, and falls Referrals and appointments  In addition, I have reviewed and discussed with patient certain preventive protocols, quality metrics, and best practice recommendations. A written personalized care plan for preventive services as well as general preventive health recommendations were provided to patient.     Randel Pigg, LPN   [id number]   Nurse Notes: none

## 2021-07-16 NOTE — Patient Instructions (Signed)
Tabitha Adams , Thank you for taking time to come for your Medicare Wellness Visit. I appreciate your ongoing commitment to your health goals. Please review the following plan we discussed and let me know if I can assist you in the future.   Screening recommendations/referrals: Colonoscopy: 04/16/2016  due 2027 Mammogram: 06/09/2021 Bone Density: 06/05/2019 Recommended yearly ophthalmology/optometry visit for glaucoma screening and checkup Recommended yearly dental visit for hygiene and checkup  Vaccinations: Influenza vaccine: due in fall 2022  Pneumococcal vaccine: completed series  Tdap vaccine: 11/16/2015 Shingles vaccine: completed series     Advanced directives: will provide copies   Conditions/risks identified: none   Next appointment: CPE 08/14/2021  300pm Dr.Jordan    Preventive Care 28 Years and Older, Female Preventive care refers to lifestyle choices and visits with your health care provider that can promote health and wellness. What does preventive care include? A yearly physical exam. This is also called an annual well check. Dental exams once or twice a year. Routine eye exams. Ask your health care provider how often you should have your eyes checked. Personal lifestyle choices, including: Daily care of your teeth and gums. Regular physical activity. Eating a healthy diet. Avoiding tobacco and drug use. Limiting alcohol use. Practicing safe sex. Taking low-dose aspirin every day. Taking vitamin and mineral supplements as recommended by your health care provider. What happens during an annual well check? The services and screenings done by your health care provider during your annual well check will depend on your age, overall health, lifestyle risk factors, and family history of disease. Counseling  Your health care provider may ask you questions about your: Alcohol use. Tobacco use. Drug use. Emotional well-being. Home and relationship well-being. Sexual  activity. Eating habits. History of falls. Memory and ability to understand (cognition). Work and work Statistician. Reproductive health. Screening  You may have the following tests or measurements: Height, weight, and BMI. Blood pressure. Lipid and cholesterol levels. These may be checked every 5 years, or more frequently if you are over 54 years old. Skin check. Lung cancer screening. You may have this screening every year starting at age 52 if you have a 30-pack-year history of smoking and currently smoke or have quit within the past 15 years. Fecal occult blood test (FOBT) of the stool. You may have this test every year starting at age 55. Flexible sigmoidoscopy or colonoscopy. You may have a sigmoidoscopy every 5 years or a colonoscopy every 10 years starting at age 14. Hepatitis C blood test. Hepatitis B blood test. Sexually transmitted disease (STD) testing. Diabetes screening. This is done by checking your blood sugar (glucose) after you have not eaten for a while (fasting). You may have this done every 1-3 years. Bone density scan. This is done to screen for osteoporosis. You may have this done starting at age 39. Mammogram. This may be done every 1-2 years. Talk to your health care provider about how often you should have regular mammograms. Talk with your health care provider about your test results, treatment options, and if necessary, the need for more tests. Vaccines  Your health care provider may recommend certain vaccines, such as: Influenza vaccine. This is recommended every year. Tetanus, diphtheria, and acellular pertussis (Tdap, Td) vaccine. You may need a Td booster every 10 years. Zoster vaccine. You may need this after age 11. Pneumococcal 13-valent conjugate (PCV13) vaccine. One dose is recommended after age 58. Pneumococcal polysaccharide (PPSV23) vaccine. One dose is recommended after age 28. Talk to your  health care provider about which screenings and vaccines  you need and how often you need them. This information is not intended to replace advice given to you by your health care provider. Make sure you discuss any questions you have with your health care provider. Document Released: 11/28/2015 Document Revised: 07/21/2016 Document Reviewed: 09/02/2015 Elsevier Interactive Patient Education  2017 [place] Prevention in the Home Falls can cause injuries. They can happen to people of all ages. There are many things you can do to make your home safe and to help prevent falls. What can I do on the outside of my home? Regularly fix the edges of walkways and driveways and fix any cracks. Remove anything that might make you trip as you walk through a door, such as a raised step or threshold. Trim any bushes or trees on the path to your home. Use bright outdoor lighting. Clear any walking paths of anything that might make someone trip, such as rocks or tools. Regularly check to see if handrails are loose or broken. Make sure that both sides of any steps have handrails. Any raised decks and porches should have guardrails on the edges. Have any leaves, snow, or ice cleared regularly. Use sand or salt on walking paths during winter. Clean up any spills in your garage right away. This includes oil or grease spills. What can I do in the bathroom? Use night lights. Install grab bars by the toilet and in the tub and shower. Do not use towel bars as grab bars. Use non-skid mats or decals in the tub or shower. If you need to sit down in the shower, use a plastic, non-slip stool. Keep the floor dry. Clean up any water that spills on the floor as soon as it happens. Remove soap buildup in the tub or shower regularly. Attach bath mats securely with double-sided non-slip rug tape. Do not have throw rugs and other things on the floor that can make you trip. What can I do in the bedroom? Use night lights. Make sure that you have a light by your bed that  is easy to reach. Do not use any sheets or blankets that are too big for your bed. They should not hang down onto the floor. Have a firm chair that has side arms. You can use this for support while you get dressed. Do not have throw rugs and other things on the floor that can make you trip. What can I do in the kitchen? Clean up any spills right away. Avoid walking on wet floors. Keep items that you use a lot in easy-to-reach places. If you need to reach something above you, use a strong step stool that has a grab bar. Keep electrical cords out of the way. Do not use floor polish or wax that makes floors slippery. If you must use wax, use non-skid floor wax. Do not have throw rugs and other things on the floor that can make you trip. What can I do with my stairs? Do not leave any items on the stairs. Make sure that there are handrails on both sides of the stairs and use them. Fix handrails that are broken or loose. Make sure that handrails are as long as the stairways. Check any carpeting to make sure that it is firmly attached to the stairs. Fix any carpet that is loose or worn. Avoid having throw rugs at the top or bottom of the stairs. If you do have throw rugs, attach them  to the floor with carpet tape. Make sure that you have a light switch at the top of the stairs and the bottom of the stairs. If you do not have them, ask someone to add them for you. What else can I do to help prevent falls? Wear shoes that: Do not have high heels. Have rubber bottoms. Are comfortable and fit you well. Are closed at the toe. Do not wear sandals. If you use a stepladder: Make sure that it is fully opened. Do not climb a closed stepladder. Make sure that both sides of the stepladder are locked into place. Ask someone to hold it for you, if possible. Clearly mark and make sure that you can see: Any grab bars or handrails. First and last steps. Where the edge of each step is. Use tools that help you  move around (mobility aids) if they are needed. These include: Canes. Walkers. Scooters. Crutches. Turn on the lights when you go into a dark area. Replace any light bulbs as soon as they burn out. Set up your furniture so you have a clear path. Avoid moving your furniture around. If any of your floors are uneven, fix them. If there are any pets around you, be aware of where they are. Review your medicines with your doctor. Some medicines can make you feel dizzy. This can increase your chance of falling. Ask your doctor what other things that you can do to help prevent falls. This information is not intended to replace advice given to you by your health care provider. Make sure you discuss any questions you have with your health care provider. Document Released: 08/28/2009 Document Revised: 04/08/2016 Document Reviewed: 12/06/2014 Elsevier Interactive Patient Education  2017 [employer].

## 2021-07-18 IMAGING — CT CT HEAD W/O CM
4 series · 16 of 47 positions shown, 18 images · non-contrast
Comparison: 01/25/2017

CLINICAL DATA: Ataxia.  Head trauma with ptosis.

EXAM:
CT HEAD WITHOUT CONTRAST
TECHNIQUE: Contiguous axial images were obtained from the base of the skull
through the vertex without intravenous contrast.

[Series 2: head 5.00 hr40 s3 axial ibhc · axial · 0.44mm/px · z∈[-637,-522]mm · 6 of 33 slices shown, 8 images]
[im 5/33  brain]
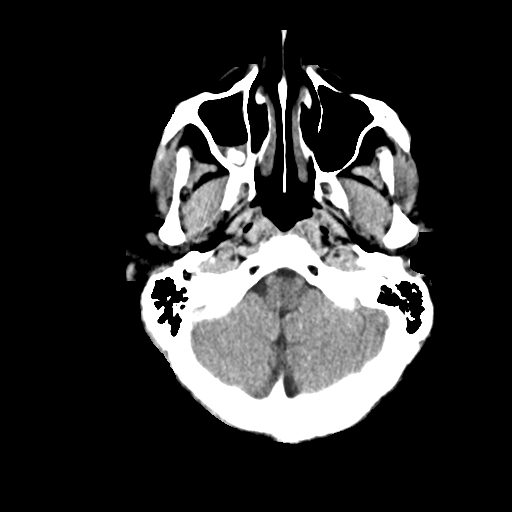
[im 5/33  bone]
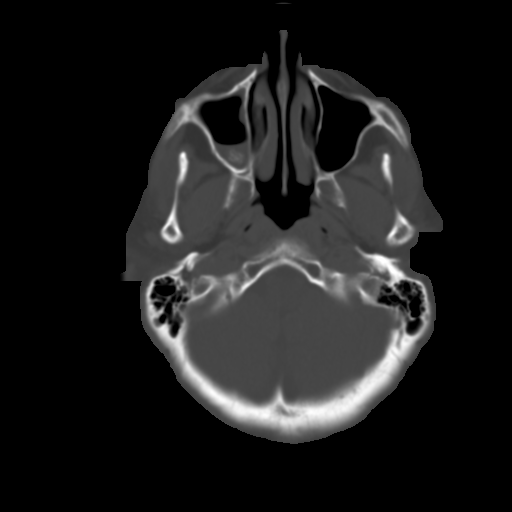
[im 10/33  brain]
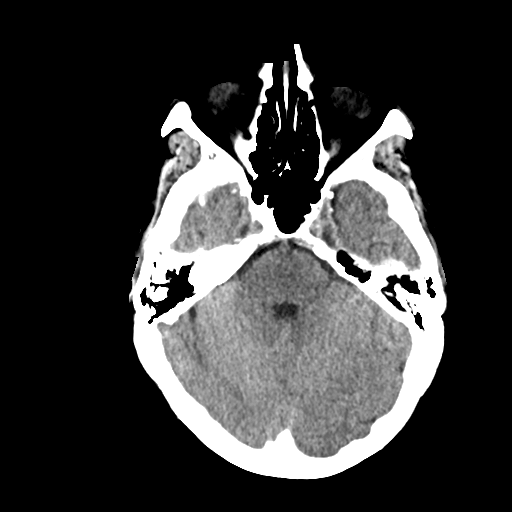
[im 14/33  brain]
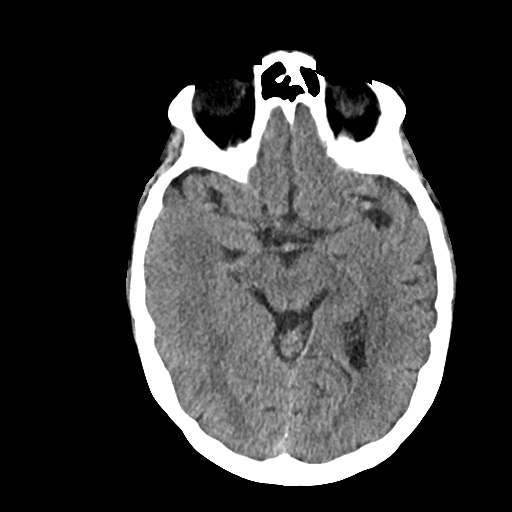
[im 19/33  brain]
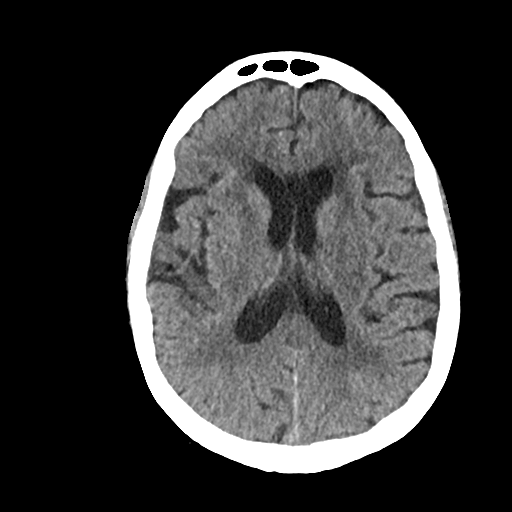
[im 23/33  brain]
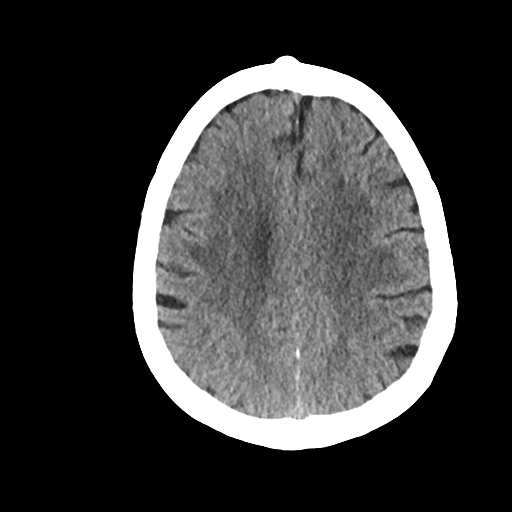
[im 23/33  bone]
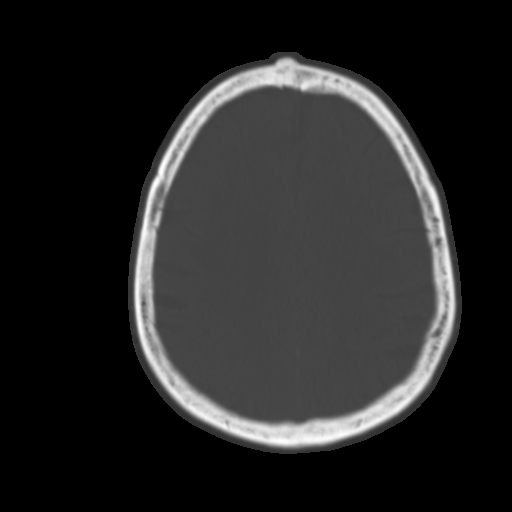
[im 28/33  brain]
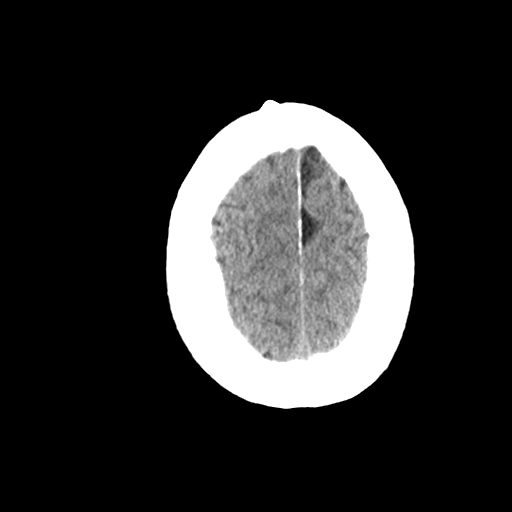

[Series 3: head 2.00 hr60 s3 axial bone · axial · 0.44mm/px · z∈[-644,-588]mm · 4 of 84 slices shown]
[im 8/84  bone]
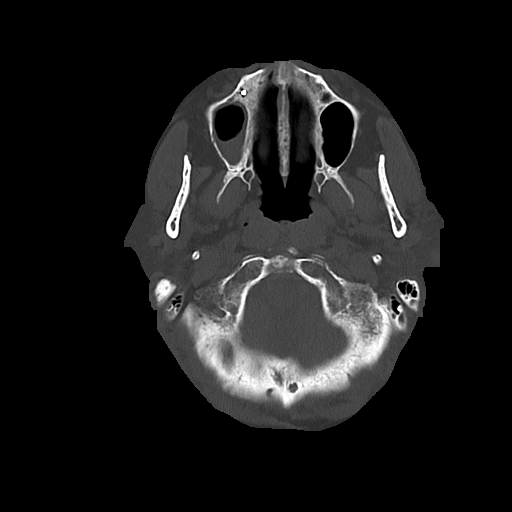
[im 16/84  bone]
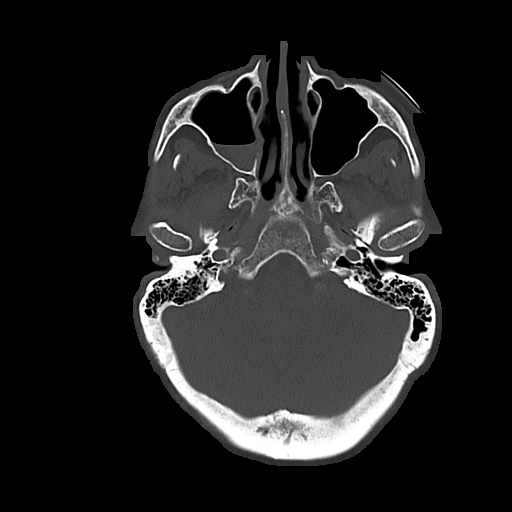
[im 28/84  bone]
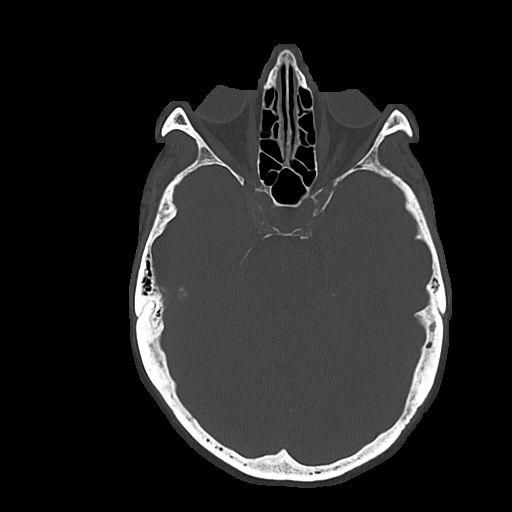
[im 36/84  bone]
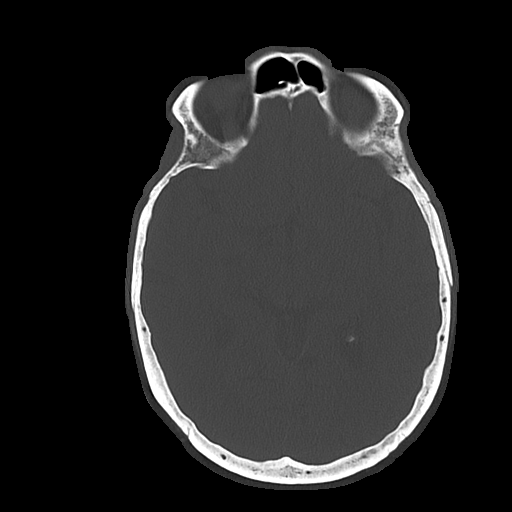

[Series 4: head 3.00 hr40 s3 sag · sagittal · 0.33mm/px · 3 of 94 slices shown]
[im 32/94  brain]
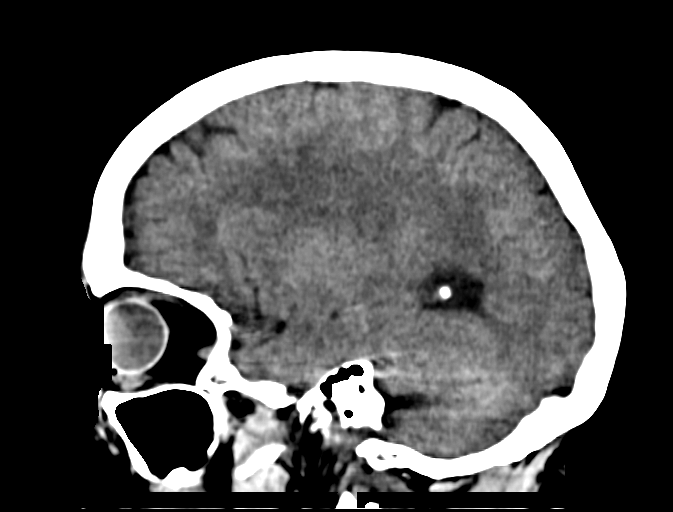
[im 47/94  brain]
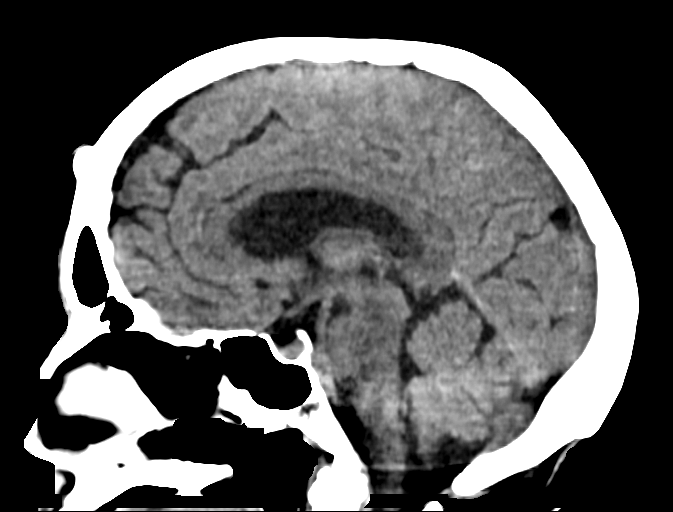
[im 63/94  brain]
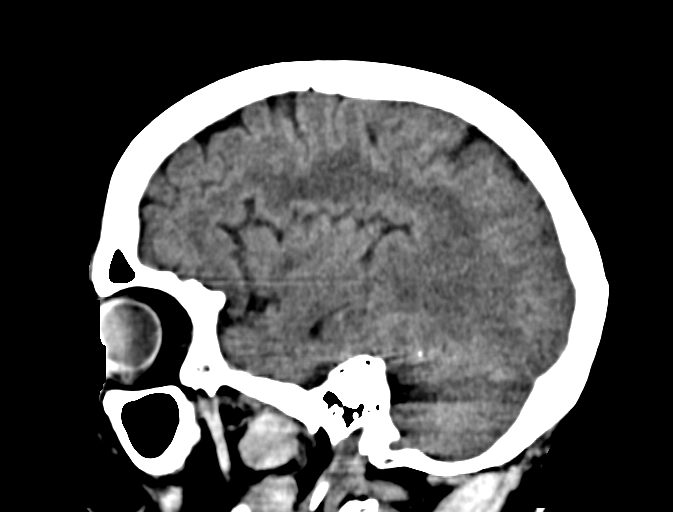

[Series 6: head 3.00 hr40 s3 cor · coronal · 0.33mm/px · 3 of 110 slices shown]
[im 37/110  brain]
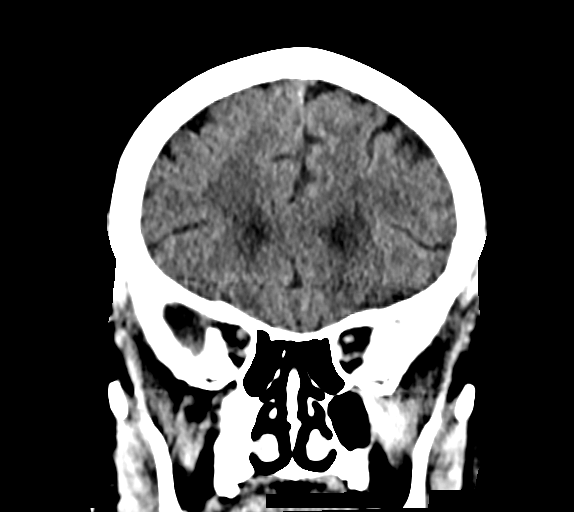
[im 49/110  brain]
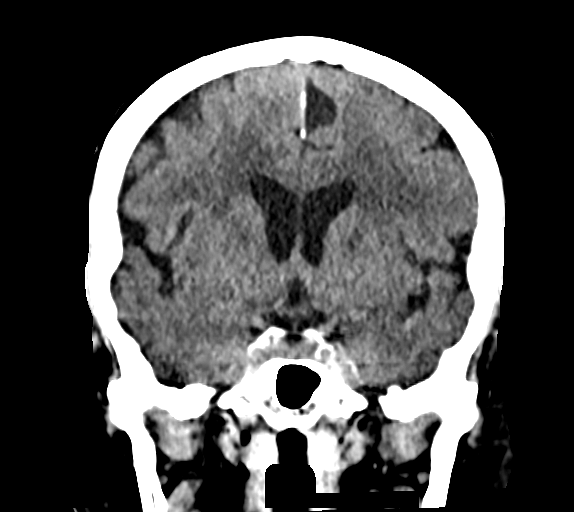
[im 61/110  brain]
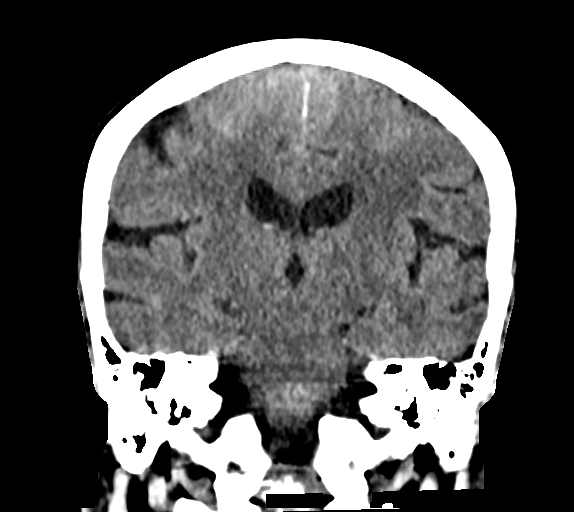

[16 of 47 positions shown; findings below may reference images not displayed]

FINDINGS: Brain: No evidence of acute infarction, hemorrhage, hydrocephalus,
extra-axial collection or mass lesion/mass effect. Chronic small
vessel ischemia in the cerebral white matter, moderate. Normal brain
volume.

Vascular: No hyperdense vessel or unexpected calcification.

Skull: Negative for fracture. Mature osteoma on the upper midline
forehead.

Sinuses/Orbits: Chronic opacification of the posterior right
maxillary sinus which includes fluid and mineralization. No
superimposed fracture. Bilateral cataract resection
IMPRESSION: No evidence of intracranial injury.

Moderate chronic small vessel ischemia, progressed from 3769.

## 2021-07-29 ENCOUNTER — Telehealth: Payer: Self-pay | Admitting: Pharmacist

## 2021-07-29 NOTE — Chronic Care Management (AMB) (Signed)
    Chronic Care Management Pharmacy Assistant   Name: Tabitha Adams  MRN: [id number] DOB: 13-Oct-1946  Reason for Encounter: Reschedule Appointment on 09-01-2021 at 3:30 with Jeni Salles per Thurmond Butts PTM.    07-29-2021 Call to patient did not reach left VM for return call back, appointment above has been cancelled.  Patient returned call, offered appointment on 12-12 patient in agreement   Medications: Outpatient Encounter Medications as of 07/29/2021  Medication Sig   alendronate (FOSAMAX) 70 MG tablet TAKE 1 TABLET BY MOUTH  EVERY 7 DAYS WITH A FULL  GLASS OF WATER ON AN EMPTY  STOMACH   Apoaequorin (PREVAGEN EXTRA STRENGTH) 20 MG CAPS Take 1 tablet by mouth daily.   b complex vitamins capsule Take 1 capsule by mouth daily.   Calcium-Phosphorus-Vitamin D [id number] MG-MG-UNIT CHEW Chew by mouth.   cetirizine (ZYRTEC) 10 MG tablet Take 10 mg by mouth at bedtime.   Cholecalciferol (VITAMIN D3) 50 MCG (2000 UT) TABS Take by mouth daily.   citalopram (CELEXA) 10 MG tablet TAKE 1/2 TABLET(5 MG) BY MOUTH DAILY   ferrous sulfate 325 (65 FE) MG tablet Take 325 mg by mouth daily with breakfast.   fluticasone (FLONASE) 50 MCG/ACT nasal spray SHAKE LIQUID AND USE 1 SPRAY IN EACH NOSTRIL TWICE DAILY AS NEEDED FOR ALLERGIES OR RHINITIS   glucosamine-chondroitin 500-400 MG tablet Take 1 tablet by mouth 2 (two) times daily.   losartan (COZAAR) 50 MG tablet TAKE 1 TABLET BY MOUTH  DAILY   meclizine (ANTIVERT) 25 MG tablet TAKE 1/2 TO 1 TABLET BY  MOUTH TWICE DAILY AS NEEDED FOR DIZZINESS   meloxicam (MOBIC) 15 MG tablet TAKE 1 TABLET BY MOUTH  DAILY   montelukast (SINGULAIR) 10 MG tablet TAKE 1 TABLET BY MOUTH AT  BEDTIME   Multiple Vitamin (MULTIVITAMIN WITH MINERALS) TABS tablet Take 1 tablet by mouth daily.   omeprazole (PRILOSEC) 20 MG capsule TAKE 1 CAPSULE BY MOUTH  DAILY   polyethylene glycol (MIRALAX / GLYCOLAX) 17 g packet Take 17 g by mouth daily.   pravastatin  (PRAVACHOL) 20 MG tablet TAKE 1 TABLET BY MOUTH  DAILY   traMADol (ULTRAM) 50 MG tablet TAKE 1 TABLET(50 MG) BY MOUTH TWICE DAILY AS NEEDED   vitamin C (VITAMIN C) 1000 MG tablet Take 1 tablet (1,000 mg total) by mouth daily.   No facility-administered encounter medications on file as of 07/29/2021.    Care Gaps: Zoster Vaccines - Overdue COVID Booster #4 Therapist, music) - Overdue Flu Vaccine - Overdue AWV - Done 07-16-2021  Star Rating Drugs: Pravastatin (Pravachol) 20 mg - Last filled 06-05-2021 90 DS at [facility] Losartan (Cozaar) 50 mg - Last filled 06-05-2021 90 DS at [facility] Pharmacist Assistant [phone]

## 2021-08-06 ENCOUNTER — Other Ambulatory Visit: Payer: Self-pay | Admitting: Family Medicine

## 2021-08-06 DIAGNOSIS — E785 Hyperlipidemia, unspecified: Secondary | ICD-10-CM

## 2021-08-14 ENCOUNTER — Encounter: Payer: Medicare Other | Admitting: Family Medicine

## 2021-08-18 ENCOUNTER — Other Ambulatory Visit: Payer: Self-pay | Admitting: Family Medicine

## 2021-08-18 DIAGNOSIS — F329 Major depressive disorder, single episode, unspecified: Secondary | ICD-10-CM

## 2021-08-19 DIAGNOSIS — D485 Neoplasm of uncertain behavior of skin: Secondary | ICD-10-CM | POA: Diagnosis not present

## 2021-08-21 ENCOUNTER — Other Ambulatory Visit: Payer: Self-pay | Admitting: Family Medicine

## 2021-08-21 DIAGNOSIS — G894 Chronic pain syndrome: Secondary | ICD-10-CM

## 2021-08-21 DIAGNOSIS — M159 Polyosteoarthritis, unspecified: Secondary | ICD-10-CM

## 2021-08-21 NOTE — Telephone Encounter (Signed)
Last filled 07/24/21

## 2021-08-27 ENCOUNTER — Other Ambulatory Visit: Payer: Self-pay
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
## 2021-08-28 ENCOUNTER — Ambulatory Visit (INDEPENDENT_AMBULATORY_CARE_PROVIDER_SITE_OTHER): Payer: Medicare Other | Admitting: Family Medicine

## 2021-08-28 ENCOUNTER — Encounter: Payer: Self-pay | Admitting: Family Medicine

## 2021-08-28 VITALS — BP 162/80 | HR 82 | Temp 98.4°F | Resp 16 | Ht <= 58 in | Wt 134.4 lb

## 2021-08-28 DIAGNOSIS — G894 Chronic pain syndrome: Secondary | ICD-10-CM | POA: Diagnosis not present

## 2021-08-28 DIAGNOSIS — F330 Major depressive disorder, recurrent, mild: Secondary | ICD-10-CM | POA: Diagnosis not present

## 2021-08-28 DIAGNOSIS — Z0000 Encounter for general adult medical examination without abnormal findings: Secondary | ICD-10-CM

## 2021-08-28 DIAGNOSIS — R7303 Prediabetes: Secondary | ICD-10-CM | POA: Insufficient documentation

## 2021-08-28 DIAGNOSIS — Z23 Encounter for immunization: Secondary | ICD-10-CM | POA: Diagnosis not present

## 2021-08-28 DIAGNOSIS — I10 Essential (primary) hypertension: Secondary | ICD-10-CM | POA: Diagnosis not present

## 2021-08-28 DIAGNOSIS — E785 Hyperlipidemia, unspecified: Secondary | ICD-10-CM

## 2021-08-28 MED ORDER — CITALOPRAM HYDROBROMIDE 10 MG PO TABS: 10.0000 mg | ORAL_TABLET | Freq: Every day | ORAL | 1 refills | Status: DC

## 2021-08-28 NOTE — Patient Instructions (Addendum)
A few things to remember from today's visit:   Hyperlipidemia, unspecified hyperlipidemia type - Plan: CMP with eGFR([facility])  Need for influenza vaccination  Need for immunization against influenza - Plan: Flu Vaccine QUAD High Dose(Fluad)  Mild recurrent major depression ([place]), Chronic  Major depressive disorder with single episode, remission status unspecified - Plan: citalopram (CELEXA) 10 MG tablet  Prediabetes - Plan: Hemoglobin A1c  Routine general medical examination at a health care facility  If you need refills please call your pharmacy. Continue monitoring blood pressure and let me know about readings in 2-3 weeks.  Do not use My Chart to request refills or for acute issues that need immediate attention.   Preventive Care 75 Years and Older, Female Preventive care refers to lifestyle choices and visits with your health care provider that can promote health and wellness. This includes: A yearly physical exam. This is also called an annual wellness visit. Regular dental and eye exams. Immunizations. Screening for certain conditions. Healthy lifestyle choices, such as: Eating a healthy diet. Getting regular exercise. Not using drugs or products that contain nicotine and tobacco. Limiting alcohol use. What can I expect for my preventive care visit? Physical exam Your health care provider will check your: Height and weight. These may be used to calculate your BMI (body mass index). BMI is a measurement that tells if you are at a healthy weight. Heart rate and blood pressure. Body temperature. Skin for abnormal spots. Counseling Your health care provider may ask you questions about your: Past medical problems. Family's medical history. Alcohol, tobacco, and drug use. Emotional well-being. Home life and relationship well-being. Sexual activity. Diet, exercise, and sleep habits. History of falls. Memory and ability to understand (cognition). Work and work  Statistician. Pregnancy and menstrual history. Access to firearms. What immunizations do I need? Vaccines are usually given at various ages, according to a schedule. Your health care provider will recommend vaccines for you based on your age, medical history, and lifestyle or other factors, such as travel or where you work. What tests do I need? Blood tests Lipid and cholesterol levels. These may be checked every 5 years, or more often depending on your overall health. Hepatitis C test. Hepatitis B test. Screening Lung cancer screening. You may have this screening every year starting at age 81 if you have a 30-pack-year history of smoking and currently smoke or have quit within the past 15 years. Colorectal cancer screening. All adults should have this screening starting at age 75 and continuing until age 55. Your health care provider may recommend screening at age 20 if you are at increased risk. You will have tests every 1-10 years, depending on your results and the type of screening test. Diabetes screening. This is done by checking your blood sugar (glucose) after you have not eaten for a while (fasting). You may have this done every 1-3 years. Mammogram. This may be done every 1-2 years. Talk with your health care provider about how often you should have regular mammograms. Abdominal aortic aneurysm (AAA) screening. You may need this if you are a current or former smoker. BRCA-related cancer screening. This may be done if you have a family history of breast, ovarian, tubal, or peritoneal cancers. Other tests STD (sexually transmitted disease) testing, if you are at risk. Bone density scan. This is done to screen for osteoporosis. You may have this done starting at age 8. Talk with your health care provider about your test results, treatment options, and if necessary, the  need for more tests. Follow these instructions at home: Eating and drinking  Eat a diet that includes fresh  fruits and vegetables, whole grains, lean protein, and low-fat dairy products. Limit your intake of foods with high amounts of sugar, saturated fats, and salt. Take vitamin and mineral supplements as recommended by your health care provider. Do not drink alcohol if your health care provider tells you not to drink. If you drink alcohol: Limit how much you have to 0-1 drink a day. Be aware of how much alcohol is in your drink. In the [place] one drink equals one 12 oz bottle of beer (355 mL), one 5 oz glass of wine (148 mL), or one 1 oz glass of hard liquor (44 mL). Lifestyle Take daily care of your teeth and gums. Brush your teeth every morning and night with fluoride toothpaste. Floss one time each day. Stay active. Exercise for at least 30 minutes 5 or more days each week. Do not use any products that contain nicotine or tobacco, such as cigarettes, e-cigarettes, and chewing tobacco. If you need help quitting, ask your health care provider. Do not use drugs. If you are sexually active, practice safe sex. Use a condom or other form of protection in order to prevent STIs (sexually transmitted infections). Talk with your health care provider about taking a low-dose aspirin or statin. Find healthy ways to cope with stress, such as: Meditation, yoga, or listening to music. Journaling. Talking to a trusted person. Spending time with friends and family. Safety Always wear your seat belt while driving or riding in a vehicle. Do not drive: If you have been drinking alcohol. Do not ride with someone who has been drinking. When you are tired or distracted. While texting. Wear a helmet and other protective equipment during sports activities. If you have firearms in your house, make sure you follow all gun safety procedures. What's next? Visit your health care provider once a year for an annual wellness visit. Ask your health care provider how often you should have your eyes and teeth checked. Stay  up to date on all vaccines. This information is not intended to replace advice given to you by your health care provider. Make sure you discuss any questions you have with your health care provider. Document Revised: 01/09/2021 Document Reviewed: 10/26/2018 Elsevier Patient Education  2022 [place].  Please be sure medication list is accurate. If a new problem present, please set up appointment sooner than planned today.

## 2021-08-28 NOTE — Assessment & Plan Note (Addendum)
BP re-checked 170/85. Reporting BP's at home < 140/90. No changes in current management, instructed to let me know about BP readings in a few weeks. Continue low salt diet.

## 2021-08-28 NOTE — Assessment & Plan Note (Addendum)
Recent events have a aggravated problem some, still mild. Recommend increasing dose of Celexa for 5 mg to 10 mg daily. Instructed about warning signs.

## 2021-08-28 NOTE — Assessment & Plan Note (Signed)
Tolerating Tramadol well, continue 50 mg bid prn. Med contract 01/2021. [place] controlled subs report reviewed.

## 2021-08-28 NOTE — Assessment & Plan Note (Signed)
Continue a healthy life style for diabetes prevention. Further recommendations according to HgA1C result. 

## 2021-09-01 ENCOUNTER — Telehealth: Payer: Medicare Other

## 2021-09-02 ENCOUNTER — Other Ambulatory Visit (INDEPENDENT_AMBULATORY_CARE_PROVIDER_SITE_OTHER): Payer: Medicare Other

## 2021-09-02 ENCOUNTER — Other Ambulatory Visit: Payer: Self-pay

## 2021-09-02 DIAGNOSIS — I10 Essential (primary) hypertension: Secondary | ICD-10-CM

## 2021-09-02 DIAGNOSIS — R7303 Prediabetes: Secondary | ICD-10-CM

## 2021-09-02 LAB — COMPREHENSIVE METABOLIC PANEL
ALT: 20 U/L (ref 0–35)
AST: 21 U/L (ref 0–37)
Albumin: 4.4 g/dL (ref 3.5–5.2)
Alkaline Phosphatase: 66 U/L (ref 39–117)
BUN: 15 mg/dL (ref 6–23)
CO2: 28 mEq/L (ref 19–32)
Calcium: 10.1 mg/dL (ref 8.4–10.5)
Chloride: 98 mEq/L (ref 96–112)
Creatinine, Ser: 0.75 mg/dL (ref 0.40–1.20)
GFR: 77.81 mL/min (ref >60.00)
Glucose, Bld: 95 mg/dL (ref 70–99)
Potassium: 4 mEq/L (ref 3.5–5.1)
Sodium: 134 mEq/L — ABNORMAL LOW (ref 135–145)
Total Bilirubin: 0.5 mg/dL (ref 0.2–1.2)
Total Protein: 6.7 g/dL (ref 6.0–8.3)

## 2021-09-02 LAB — HEMOGLOBIN A1C: Hgb A1c MFr Bld: 5.6 % (ref 4.6–6.5)

## 2021-10-23 ENCOUNTER — Telehealth: Payer: Self-pay | Admitting: Pharmacist

## 2021-10-23 NOTE — Chronic Care Management (AMB) (Signed)
    Chronic Care Management Pharmacy Assistant   Name: Tabitha Adams  MRN: [id number] DOB: 1946-07-09  10/23/21 APPOINTMENT REMINDER   Patient  was reminded to have all medications, supplements and any blood glucose and blood pressure readings available for review with Jeni Salles, Pharm. D, for telephone visit on 10/26/21 at 3:30.  Care Gaps: Zoster Vaccines - Overdue COVID Booster #4 [employer]) - Overdue AWV - Done 07/16/21 BP - 162/80 (08/28/21)  Star Rating Drug: Pravastatin (Pravachol) 20 mg - Last filled 08/27/2021 90 DS at [facility] Losartan (Cozaar) 50 mg - Last filled 08/27/2021 90 DS at [facility]  Any gaps in medications fill history? None   Medications: Outpatient Encounter Medications as of 10/23/2021  Medication Sig   alendronate (FOSAMAX) 70 MG tablet TAKE 1 TABLET BY MOUTH  EVERY 7 DAYS WITH A FULL  GLASS OF WATER ON AN EMPTY  STOMACH   Apoaequorin (PREVAGEN EXTRA STRENGTH) 20 MG CAPS Take 1 tablet by mouth daily.   b complex vitamins capsule Take 1 capsule by mouth daily.   Calcium-Phosphorus-Vitamin D [phone] MG-MG-UNIT CHEW Chew by mouth.   cetirizine (ZYRTEC) 10 MG tablet Take 10 mg by mouth at bedtime.   Cholecalciferol (VITAMIN D3) 50 MCG (2000 UT) TABS Take by mouth daily.   citalopram (CELEXA) 10 MG tablet Take 1 tablet (10 mg total) by mouth daily.   ferrous sulfate 325 (65 FE) MG tablet Take 325 mg by mouth daily with breakfast.   fluticasone (FLONASE) 50 MCG/ACT nasal spray SHAKE LIQUID AND USE 1 SPRAY IN EACH NOSTRIL TWICE DAILY AS NEEDED FOR ALLERGIES OR RHINITIS   glucosamine-chondroitin 500-400 MG tablet Take 1 tablet by mouth 2 (two) times daily.   losartan (COZAAR) 50 MG tablet TAKE 1 TABLET BY MOUTH  DAILY   meclizine (ANTIVERT) 25 MG tablet TAKE 1/2 TO 1 TABLET BY  MOUTH TWICE DAILY AS NEEDED FOR DIZZINESS   meloxicam (MOBIC) 15 MG tablet TAKE 1 TABLET BY MOUTH  DAILY   montelukast (SINGULAIR) 10 MG tablet TAKE 1 TABLET BY MOUTH AT  BEDTIME    Multiple Vitamin (MULTIVITAMIN WITH MINERALS) TABS tablet Take 1 tablet by mouth daily.   omeprazole (PRILOSEC) 20 MG capsule TAKE 1 CAPSULE BY MOUTH  DAILY   polyethylene glycol (MIRALAX / GLYCOLAX) 17 g packet Take 17 g by mouth daily.   pravastatin (PRAVACHOL) 20 MG tablet TAKE 1 TABLET BY MOUTH  DAILY   traMADol (ULTRAM) 50 MG tablet TAKE 1 TABLET(50 MG) BY MOUTH TWICE DAILY AS NEEDED   vitamin C (VITAMIN C) 1000 MG tablet Take 1 tablet (1,000 mg total) by mouth daily.   No facility-administered encounter medications on file as of 10/23/2021.      [place] Clinical Pharmacist Assistant [phone]

## 2021-10-24 ENCOUNTER — Other Ambulatory Visit: Payer: Self-pay | Admitting: Family Medicine

## 2021-10-26 ENCOUNTER — Ambulatory Visit: Payer: Medicare Other | Admitting: Pharmacist

## 2021-10-26 DIAGNOSIS — I10 Essential (primary) hypertension: Secondary | ICD-10-CM

## 2021-10-26 DIAGNOSIS — F330 Major depressive disorder, recurrent, mild: Secondary | ICD-10-CM

## 2021-10-26 NOTE — Progress Notes (Signed)
Chronic Care Management Pharmacy Note  10/26/2021 Name:  Tabitha Adams MRN:  [id number] DOB:  [birth year]  Summary: Pt reports anxiety is better with citalopram Pt is having daytime headaches   Recommendations/Changes made from today's visit: -Recommended checking BP when having headaches -Recommended bringing BP cuff to office visit to ensure accuracy -Recommended trial of saline nasal spray for congestion   Plan: BP assessment in 3-4 weeks  Subjective: Tabitha Adams is an 75 y.o. year old female who is a primary patient of [name], Malka So, MD.  The CCM team was consulted for assistance with disease management and care coordination needs.    Engaged with patient by telephone for follow up visit in response to provider referral for pharmacy case management and/or care coordination services.   Consent to Services:  The patient was given information about Chronic Care Management services, agreed to services, and gave verbal consent prior to initiation of services.  Please see initial visit note for detailed documentation.   Patient Care Team: [name], [name], MD as PCP - General (Family Medicine) Marica Otter, [place] (Optometry) Viona Gilmore, [facility] as Pharmacist (Pharmacist)  Recent office visits: 08/28/21 Betty [name], MD: Patient presented for annual exam. Increased Celexa to 10 mg daily. Influenza vaccine administered. BP elevated.  07/16/21 Randel Pigg, LPN: Patient presented for AWV.  Recent consult visits: 06-09-2021 [name] [name] MD - Patient presented to [facility] for Mammogram.   05-19-2021 Ladene Artist, MD (Gastroenterology) - Patient presented for Chronic idiopathic constipation and other concerns. No medication changes.   Hospital visits: None in previous 6 months  Objective:  Lab Results  Component Value Date   CREATININE 0.75 09/02/2021   BUN 15 09/02/2021   GFR 77.81 09/02/2021   GFRNONAA 82 08/06/2020   GFRAA 96 08/06/2020    NA 134 (L) 09/02/2021   K 4.0 09/02/2021   CALCIUM 10.1 09/02/2021   CO2 28 09/02/2021   GLUCOSE 95 09/02/2021    Lab Results  Component Value Date/Time   HGBA1C 5.6 09/02/2021 11:41 AM   HGBA1C 5.8 02/24/2021 07:52 AM   GFR 77.81 09/02/2021 11:41 AM   GFR 73.37 02/24/2021 07:52 AM    Last diabetic Eye exam: No results found for: HMDIABEYEEXA  Last diabetic Foot exam: No results found for: HMDIABFOOTEX   Lab Results  Component Value Date   CHOL 140 02/24/2021   HDL 47.70 02/24/2021   LDLCALC 65 02/24/2021   TRIG 135.0 02/24/2021   CHOLHDL 3 02/24/2021    Hepatic Function Latest Ref Rng & Units 09/02/2021 08/06/2020 08/06/2019  Total Protein 6.0 - 8.3 g/dL 6.7 6.3 6.9  Albumin 3.5 - 5.2 g/dL 4.4 - 4.5  AST 0 - 37 U/L _0 ALT 0 - 35 U/L _1 Alk Phosphatase 39 - 117 U/L 66 - 78  Total Bilirubin 0.2 - 1.2 mg/dL 0.5 0.4 0.4    Lab Results  Component Value Date/Time   TSH 0.86 07/24/2018 09:48 AM   TSH 0.85 06/11/2016 12:00 AM   TSH 2.37 12/12/2006 09:08 AM    CBC Latest Ref Rng & Units 02/24/2021 02/01/2018 01/25/2017  WBC 4.0 - 10.5 K/uL 5.4 5.2 11.8(H)  Hemoglobin 12.0 - 15.0 g/dL 12.0 11.7(L) 11.1(L)  Hematocrit 36.0 - 46.0 % 35.3(L) 33.8(L) 31.9(L)  Platelets 150.0 - 400.0 K/uL 297.0 293.0 244    No results found for: VD25OH  Clinical ASCVD: No  The 10-year ASCVD risk score (Arnett DK, et al.,  2019) is: 30.7%   Values used to calculate the score:     Age: 67 years     Sex: Female     Is Non-Hispanic African American: No     Diabetic: No     Tobacco smoker: No     Systolic Blood Pressure: 893 mmHg     Is BP treated: Yes     HDL Cholesterol: 47.7 mg/dL     Total Cholesterol: 140 mg/dL    Depression screen [facility] 2/9 08/28/2021 07/16/2021 07/16/2021  Decreased Interest 0 0 0  Down, Depressed, Hopeless 1 0 0  PHQ - 2 Score 1 0 0  Altered sleeping 0 - -  Tired, decreased energy 2 - -  Change in appetite 0 - -  Feeling bad or failure about yourself   1 - -  Trouble concentrating 0 - -  Moving slowly or fidgety/restless 0 - -  Suicidal thoughts 0 - -  PHQ-9 Score 4 - -  Difficult doing work/chores Not difficult at all - -      Social History   Tobacco Use  Smoking Status Former  Smokeless Tobacco Never   BP Readings from Last 3 Encounters:  08/28/21 (!) 162/80  07/16/21 124/64  05/19/21 130/70   Pulse Readings from Last 3 Encounters:  08/28/21 82  07/16/21 84  05/19/21 76   Wt Readings from Last 3 Encounters:  08/28/21 134 lb 6.4 oz (61 kg)  07/16/21 139 lb (63 kg)  05/19/21 138 lb 9.6 oz (62.9 kg)   BMI Readings from Last 3 Encounters:  08/28/21 29.00 kg/m  07/16/21 29.05 kg/m  05/19/21 27.99 kg/m    Assessment/Interventions: Review of patient past medical history, allergies, medications, health status, including review of consultants reports, laboratory and other test data, was performed as part of comprehensive evaluation and provision of chronic care management services.   SDOH:  (Social Determinants of Health) assessments and interventions performed: No  SDOH Screenings   Alcohol Screen: Low Risk    Last Alcohol Screening Score (AUDIT): 0  Depression (PHQ2-9): Low Risk    PHQ-2 Score: 4  Financial Resource Strain: Low Risk    Difficulty of Paying Living Expenses: Not hard at all  Food Insecurity: No Food Insecurity   Worried About Charity fundraiser in the Last Year: Never true   Ran Out of Food in the Last Year: Never true  Housing: Low Risk    Last Housing Risk Score: 0  Physical Activity: Insufficiently Active   Days of Exercise per Week: 5 days   Minutes of Exercise per Session: 10 min  Social Connections: Moderately Integrated   Frequency of Communication with Friends and Family: More than three times a week   Frequency of Social Gatherings with Friends and Family: More than three times a week   Attends Religious Services: More than 4 times per year   Active Member of Clubs or  Organizations: No   Attends Music therapist: More than 4 times per year   Marital Status: Widowed  Stress: No Stress Concern Present   Feeling of Stress : Not at all  Tobacco Use: Medium Risk   Smoking Tobacco Use: Former   Smokeless Tobacco Use: Never   Passive Exposure: Not on file  Transportation Needs: No Transportation Needs   Lack of Transportation (Medical): No   Lack of Transportation (Non-Medical): No    CCM Care Plan  Allergies  Allergen Reactions   Cymbalta [Duloxetine Hcl] Nausea Only  Adverse reaction caused dizziness and nausea   Elemental Sulfur Swelling and Other (See Comments)    Reaction:  All over body swelling    Penicillins Rash and Other (See Comments)    Has patient had a PCN reaction causing immediate rash, facial/tongue/throat swelling, SOB or lightheadedness with hypotension: No Has patient had a PCN reaction causing severe rash involving mucus membranes or skin necrosis: No Has patient had a PCN reaction that required hospitalization No Has patient had a PCN reaction occurring within the last 10 years: No If all of the above answers are "NO", then may proceed with Cephalosporin use.    Medications Reviewed Today     Reviewed by [name], [name], MD (Physician) on 08/28/21 at 1601  Med List Status: <None>   Medication Order Taking? Sig Documenting Provider Last Dose Status Informant  alendronate (FOSAMAX) 70 MG tablet [id number] Yes TAKE 1 TABLET BY MOUTH  EVERY 7 DAYS WITH A FULL  GLASS OF WATER ON AN EMPTY  STOMACH [name], [name], MD Taking Active   Apoaequorin (PREVAGEN EXTRA STRENGTH) 20 MG CAPS [id number] Yes Take 1 tablet by mouth daily. [provider] Taking Active   b complex vitamins capsule [id number] Yes Take 1 capsule by mouth daily. [provider] Taking Active Self  Calcium-Phosphorus-Vitamin D [phone] MG-MG-UNIT CHEW [id number] Yes Chew by mouth. [provider] Taking Active   cetirizine  (ZYRTEC) 10 MG tablet [id number] Yes Take 10 mg by mouth at bedtime. [provider] Taking Active Self  Cholecalciferol (VITAMIN D3) 50 MCG (2000 UT) TABS [id number] Yes Take by mouth daily. [provider] Taking Active   citalopram (CELEXA) 10 MG tablet [id number]  Take 1 tablet (10 mg total) by mouth daily. [name], [name], MD  Active   ferrous sulfate 325 (65 FE) MG tablet [id number] Yes Take 325 mg by mouth daily with breakfast. [provider] Taking Active   fluticasone (FLONASE) 50 MCG/ACT nasal spray [id number] Yes SHAKE LIQUID AND USE 1 SPRAY IN EACH NOSTRIL TWICE DAILY AS NEEDED FOR ALLERGIES OR RHINITIS [name], [name], MD Taking Active   glucosamine-chondroitin 500-400 MG tablet [id number] Yes Take 1 tablet by mouth 2 (two) times daily. [provider] Taking Active Self  losartan (COZAAR) 50 MG tablet [id number] Yes TAKE 1 TABLET BY MOUTH  DAILY [name], [name], MD Taking Active   meclizine (ANTIVERT) 25 MG tablet [id number] Yes TAKE 1/2 TO 1 TABLET BY  MOUTH TWICE DAILY AS NEEDED FOR DIZZINESS [name], [name], MD Taking Active   meloxicam [facility]) 15 MG tablet [id number] Yes TAKE 1 TABLET BY MOUTH  DAILY [name], [name], MD Taking Active   montelukast (SINGULAIR) 10 MG tablet [id number] Yes TAKE 1 TABLET BY MOUTH AT  BEDTIME [name], [name], MD Taking Active   Multiple Vitamin (MULTIVITAMIN WITH MINERALS) TABS tablet [id number] Yes Take 1 tablet by mouth daily. [provider] Taking Active Self  omeprazole (PRILOSEC) 20 MG capsule [id number] Yes TAKE 1 CAPSULE BY MOUTH  DAILY [name], [name], MD Taking Active   polyethylene glycol (MIRALAX / GLYCOLAX) 17 g packet [id number] Yes Take 17 g by mouth daily. [provider] Taking Active   pravastatin (PRAVACHOL) 20 MG tablet [id number] Yes TAKE 1 TABLET BY MOUTH  DAILY [name], [name], MD Taking Active   traMADol (ULTRAM) 50 MG tablet [id number] Yes TAKE 1 TABLET(50 MG) BY MOUTH TWICE DAILY AS NEEDED  [name], [name], MD Taking Active   vitamin C (VITAMIN C)  1000 MG tablet [id number] Yes Take 1 tablet (1,000 mg total) by mouth daily. Lisette Abu, PA-C Taking Active             Patient Active Problem List   Diagnosis Date Noted   Prediabetes 08/28/2021   Vaginal vault prolapse 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 03/03/2020   Cystocele, midline 03/03/2020   Benign essential tremor 07/24/2018   Mild recurrent major depression ([place]) 03/22/2018   Benign paroxysmal positional vertigo due to bilateral vestibular disorder 09/27/2017   Barton's fracture of right radius 01/25/2017   Essential hypertension, benign 10/04/2016   Generalized osteoarthritis of multiple sites 08/31/2016   Chronic back pain 07/13/2016   Hyperlipidemia 07/13/2016   Chronic allergic rhinitis 07/13/2016   GERD (gastroesophageal reflux disease) 07/13/2016   Chronic pain disorder 07/13/2016    Immunization History  Administered Date(s) Administered   Fluad Quad(high Dose 65+) 08/06/2019, 08/28/2021   Influenza, High Dose Seasonal PF 08/31/2016, 09/07/2017, 07/24/2018   PFIZER(Purple Top)SARS-COV-2 Vaccination 01/05/2020, 01/30/2020, 11/03/2020   Pneumococcal Conjugate-13 01/22/2019   Pneumococcal Polysaccharide-23 07/06/2020   Tdap 11/16/2015   Zoster Recombinat (Shingrix) 11/25/2020   Patient reported she hasn't checked her BP the last couple of days but it has been normal for her lately. She does admit to having frequent headaches during the middle of the day but thinks this is related to stress. Patient does feel like the higher dose of the citalopram has been very helpful for her during the holiday season and she is trying to aim to get at least 6 hours of sleep a night.   Conditions to be addressed/monitored:  Hypertension, Hyperlipidemia, GERD, Anxiety, Osteopenia, Osteoarthritis and Allergic Rhinitis  Conditions addressed this visit: Hypertension, anxiety  Patient Care Plan: CCM Pharmacy Care Plan      Problem Identified: Problem: HTN, HLD, allergic rhinitis, anxiety, GERD, osteopenia, pain/OA      Long-Range Goal: Patient-Specific Goal   Start Date: 12/03/2020  Expected End Date: 12/03/2021  Recent Progress: On track  Priority: High  Note:   Current Barriers:  Unable to independently monitor therapeutic efficacy  Pharmacist Clinical Goal(s):  Patient will achieve adherence to monitoring guidelines and medication adherence to achieve therapeutic efficacy through collaboration with PharmD and provider.   Interventions: 1:1 collaboration with [name], [name], MD regarding development and update of comprehensive plan of care as evidenced by provider attestation and co-signature Inter-disciplinary care team collaboration (see longitudinal plan of care) Comprehensive medication review performed; medication list updated in electronic medical record  Hypertension (BP goal <140/90) -controlled -Current treatment: Losartan 50 mg 1 tablet daily -Medications previously tried: none  -Current home readings: 130/70 (arm cuff) - not brought into office -Current dietary habits: does not currently add salt to foods but does eat a lot of prepackaged foods -Current exercise habits: no structured exercise but does stand on feet during work hours -Reports hypotensive/hypertensive symptoms -Educated on Exercise goal of 150 minutes per week; Importance of home blood pressure monitoring; -Counseled to monitor BP at home weekly, document, and provide log at future appointments -Counseled on diet and exercise extensively Recommended to continue current medication Recommended checking BP when she is having headaches.  Hyperlipidemia: (LDL goal < 100) -controlled -Current treatment: Pravastatin 20 mg 1 tablet daily -Medications previously tried: none  -Current dietary patterns: eats a lot of prepackaged foods -Current exercise habits: no structured exercise but does stand on feet during work  hours -Educated on Cholesterol goals;  Exercise goal of 150 minutes per week; -Counseled on diet and exercise  extensively Recommended to continue current medication  Allergic rhinitis (Goal: minimize symptoms of allergies) -controlled -Current treatment  Flonase 50 mcg/act nasal spray 1 spray in both nostrils at bedtime Montelukast 10 mg 1 tablet at bedtime Cetirizine 10 mg 1 tablet at bedtime -Medications previously tried: none  -Recommended to continue current medication Recommended trial of saline nasal spray for congestion.  Anxiety (Goal: minimize symptoms of anxiety) -controlled -Current treatment: Citalopram 10 mg 1 tablet daily -Medications previously tried/failed: none -PHQ9: 2 -GAD7: 2 -Educated on Benefits of medication for symptom control -Recommended to continue current medication  GERD (Goal: minimize symptoms of heartburn or acid reflux) -controlled -Current treatment  Omeprazole 20 mg 1 capsule daily -Medications previously tried: none  -Counseled on long term risks of taking PPIs and rebound acid reflux with tapering Educated on non-pharmacologic management of symptoms such as elevating the head of your bed, avoiding eating 2-3 hours before bed, avoiding triggering foods such as acidic, spicy, or fatty foods, eating smaller meals, and wearing clothes that are loose around the waist  Osteopenia (Goal improve bone density and prevent fractures) -controlled -Last DEXA Scan:  06/05/2019  T-Score femoral neck: -2.4 (total -1.9)  T-Score total hip: n/a  T-Score lumbar spine: n/a  T-Score forearm radius: -1.3  10-year probability of major osteoporotic fracture: n/a  10-year probability of hip fracture: n/a -Patient is not a candidate for pharmacologic treatment -Current treatment  Vitamin D 2000 units 1 tablet daily Calcium 500 mg daily  Alendronate 70 mg tablet (Sundays) -Medications previously tried: none  -Recommend [phone] units of vitamin D daily.  Recommend 1200 mg of calcium daily from dietary and supplemental sources. Counseled on oral bisphosphonate administration: take in the morning, 30 minutes prior to food with 6-8 oz of water. Do not lie down for at least 30 minutes after taking. Recommend weight-bearing and muscle strengthening exercises for building and maintaining bone density. -Recommended to continue current medication  Pain (Goal: minimize symptoms of ppain) -controlled -Current treatment  Meloxicam 15 mg 1 tablet daily Tramadol 50 mg 1 tablet twice daily as needed -Medications previously tried: none  -Recommended to continue current medication Counseled on preference of Tylenol over NSAIDs to protect kidneys and to minimize risk of bleeding   Health Maintenance -Vaccine gaps:  -Current therapy:  Prevagen 20 mg 1 capsule daily Vitamin B complex 1 capsule daily Glucosamine-chondroitin 500-400 mg tablet 1 tablet twice daily Super energy herbal complex daily Multivitamin 1 tablet daily  Vitamin C 1000 mg 1 tablet daily Meclizine 25 mg 1/2 tablet as needed Probiotics and multivitamin 2 in the morning and 2 at night Balance advanced proactive hormone support 2 in AM and 2 in PM Elderberry daily Vitamin B12 daily Zinc 50 mg 1 tablet daily -Educated on Herbal supplement research is limited and benefits usually cannot be proven Cost vs benefit of each product must be carefully weighed by individual consumer Supplements may interfere with prescription drugs -Patient is satisfied with current therapy and denies issues -Recommended stopping supplementation with Prevagen due to lack of benefit Educated on preventative measure to take that are protective of brain including limiting sugar and dairy products (inflammatory foods) and increasing exercise  Patient Goals/Self-Care Activities Patient will:  - take medications as prescribed check blood pressure weekly, document, and provide at future appointments  Follow Up  Plan: Telephone follow up appointment with care management team member scheduled for: 6 months      Medication Assistance: None required.  Patient affirms current coverage meets needs.  Compliance/Adherence/Medication fill history: Care Gaps: Shingrix, COVID booster Last BP - 162/80 (08/28/21)  Star-Rating Drugs: Pravastatin (Pravachol) 20 mg - Last filled 08/27/2021 90 DS at [facility] Losartan (Cozaar) 50 mg - Last filled 08/27/2021 90 DS at [facility]  Patient's preferred pharmacy is:  [facility] [place], [place] - [address] AT [address] [place] [address] Phone: [phone] Fax: [phone]  [facility] ([place], [place] [address] [address] Phone: [phone] Fax: [phone]  [facility] ([facility] ) - [place], [place] [address] Phone: [phone] Fax: [phone]  Uses pill box? Yes  Pt endorses 95% compliance  We discussed: Current pharmacy is preferred with insurance plan and patient is satisfied with pharmacy services Patient decided to: Continue current medication management strategy  Care Plan and Follow Up Patient Decision:  Patient agrees to Care Plan and Follow-up.  Plan: Telephone follow up appointment with care management team member scheduled for:  6 months  Jeni Salles, PharmD [place] Pharmacist [place] at [place] [phone]

## 2021-10-26 NOTE — Patient Instructions (Signed)
Hi Tabitha Adams,  It was great to get to catch up with you again! Go ahead and try that saline nasal spray like we talked about to see if this helps with your congestion.  Also, don't forget to check your blood pressure when you are having headaches.  Please reach out to me if you have any questions or need anything before our follow up!  Best, Maddie  Jeni Salles, PharmD, [facility]   Visit Information   Goals Addressed   None    Patient Care Plan: CCM Pharmacy Care Plan     Problem Identified: Problem: HTN, HLD, allergic rhinitis, anxiety, GERD, osteopenia, pain/OA      Long-Range Goal: Patient-Specific Goal   Start Date: 12/03/2020  Expected End Date: 12/03/2021  Recent Progress: On track  Priority: High  Note:   Current Barriers:  Unable to independently monitor therapeutic efficacy  Pharmacist Clinical Goal(s):  Patient will achieve adherence to monitoring guidelines and medication adherence to achieve therapeutic efficacy through collaboration with PharmD and provider.   Interventions: 1:1 collaboration with [name], [name], MD regarding development and update of comprehensive plan of care as evidenced by provider attestation and co-signature Inter-disciplinary care team collaboration (see longitudinal plan of care) Comprehensive medication review performed; medication list updated in electronic medical record  Hypertension (BP goal <140/90) -controlled -Current treatment: Losartan 50 mg 1 tablet daily -Medications previously tried: none  -Current home readings: 130/70 (arm cuff) - not brought into office -Current dietary habits: does not currently add salt to foods but does eat a lot of prepackaged foods -Current exercise habits: no structured exercise but does stand on feet during work hours -Reports hypotensive/hypertensive symptoms -Educated on Exercise goal of 150 minutes per week; Importance of  home blood pressure monitoring; -Counseled to monitor BP at home weekly, document, and provide log at future appointments -Counseled on diet and exercise extensively Recommended to continue current medication Recommended checking BP when she is having headaches.  Hyperlipidemia: (LDL goal < 100) -controlled -Current treatment: Pravastatin 20 mg 1 tablet daily -Medications previously tried: none  -Current dietary patterns: eats a lot of prepackaged foods -Current exercise habits: no structured exercise but does stand on feet during work hours -Educated on Cholesterol goals;  Exercise goal of 150 minutes per week; -Counseled on diet and exercise extensively Recommended to continue current medication  Allergic rhinitis (Goal: minimize symptoms of allergies) -controlled -Current treatment  Flonase 50 mcg/act nasal spray 1 spray in both nostrils at bedtime Montelukast 10 mg 1 tablet at bedtime Cetirizine 10 mg 1 tablet at bedtime -Medications previously tried: none  -Recommended to continue current medication Recommended trial of saline nasal spray for congestion.  Anxiety (Goal: minimize symptoms of anxiety) -controlled -Current treatment: Citalopram 10 mg 1 tablet daily -Medications previously tried/failed: none -PHQ9: 2 -GAD7: 2 -Educated on Benefits of medication for symptom control -Recommended to continue current medication  GERD (Goal: minimize symptoms of heartburn or acid reflux) -controlled -Current treatment  Omeprazole 20 mg 1 capsule daily -Medications previously tried: none  -Counseled on long term risks of taking PPIs and rebound acid reflux with tapering Educated on non-pharmacologic management of symptoms such as elevating the head of your bed, avoiding eating 2-3 hours before bed, avoiding triggering foods such as acidic, spicy, or fatty foods, eating smaller meals, and wearing clothes that are loose around the waist  Osteopenia (Goal improve bone density  and prevent fractures) -controlled -Last DEXA Scan:  06/05/2019  T-Score  femoral neck: -2.4 (total -1.9)  T-Score total hip: n/a  T-Score lumbar spine: n/a  T-Score forearm radius: -1.3  10-year probability of major osteoporotic fracture: n/a  10-year probability of hip fracture: n/a -Patient is not a candidate for pharmacologic treatment -Current treatment  Vitamin D 2000 units 1 tablet daily Calcium 500 mg daily  Alendronate 70 mg tablet (Sundays) -Medications previously tried: none  -Recommend [phone] units of vitamin D daily. Recommend 1200 mg of calcium daily from dietary and supplemental sources. Counseled on oral bisphosphonate administration: take in the morning, 30 minutes prior to food with 6-8 oz of water. Do not lie down for at least 30 minutes after taking. Recommend weight-bearing and muscle strengthening exercises for building and maintaining bone density. -Recommended to continue current medication  Pain (Goal: minimize symptoms of ppain) -controlled -Current treatment  Meloxicam 15 mg 1 tablet daily Tramadol 50 mg 1 tablet twice daily as needed -Medications previously tried: none  -Recommended to continue current medication Counseled on preference of Tylenol over NSAIDs to protect kidneys and to minimize risk of bleeding   Health Maintenance -Vaccine gaps:  -Current therapy:  Prevagen 20 mg 1 capsule daily Vitamin B complex 1 capsule daily Glucosamine-chondroitin 500-400 mg tablet 1 tablet twice daily Super energy herbal complex daily Multivitamin 1 tablet daily  Vitamin C 1000 mg 1 tablet daily Meclizine 25 mg 1/2 tablet as needed Probiotics and multivitamin 2 in the morning and 2 at night Balance advanced proactive hormone support 2 in AM and 2 in PM Elderberry daily Vitamin B12 daily Zinc 50 mg 1 tablet daily -Educated on Herbal supplement research is limited and benefits usually cannot be proven Cost vs benefit of each product must be carefully weighed  by individual consumer Supplements may interfere with prescription drugs -Patient is satisfied with current therapy and denies issues -Recommended stopping supplementation with Prevagen due to lack of benefit Educated on preventative measure to take that are protective of brain including limiting sugar and dairy products (inflammatory foods) and increasing exercise  Patient Goals/Self-Care Activities Patient will:  - take medications as prescribed check blood pressure weekly, document, and provide at future appointments  Follow Up Plan: Telephone follow up appointment with care management team member scheduled for: 6 months        Patient verbalizes understanding of instructions provided today and agrees to view in [place].  Telephone follow up appointment with pharmacy team member scheduled for: 6 months  Viona Gilmore, [facility]

## 2021-10-28 ENCOUNTER — Other Ambulatory Visit: Payer: Self-pay | Admitting: Family Medicine

## 2021-10-28 DIAGNOSIS — K219 Gastro-esophageal reflux disease without esophagitis: Secondary | ICD-10-CM

## 2021-10-28 DIAGNOSIS — J309 Allergic rhinitis, unspecified: Secondary | ICD-10-CM

## 2021-10-28 DIAGNOSIS — I10 Essential (primary) hypertension: Secondary | ICD-10-CM

## 2021-11-11 ENCOUNTER — Ambulatory Visit (INDEPENDENT_AMBULATORY_CARE_PROVIDER_SITE_OTHER): Payer: Medicare Other

## 2021-11-11 ENCOUNTER — Ambulatory Visit (INDEPENDENT_AMBULATORY_CARE_PROVIDER_SITE_OTHER): Payer: Medicare Other | Admitting: Family Medicine

## 2021-11-11 ENCOUNTER — Other Ambulatory Visit: Payer: Self-pay

## 2021-11-11 VITALS — BP 140/70 | HR 80 | Temp 98.3°F | Ht <= 58 in | Wt 140.2 lb

## 2021-11-11 DIAGNOSIS — J449 Chronic obstructive pulmonary disease, unspecified: Secondary | ICD-10-CM | POA: Diagnosis not present

## 2021-11-11 DIAGNOSIS — R2689 Other abnormalities of gait and mobility: Secondary | ICD-10-CM | POA: Diagnosis not present

## 2021-11-11 DIAGNOSIS — M546 Pain in thoracic spine: Secondary | ICD-10-CM

## 2021-11-11 DIAGNOSIS — M545 Low back pain, unspecified: Secondary | ICD-10-CM | POA: Diagnosis not present

## 2021-11-11 NOTE — Progress Notes (Signed)
Tabitha Adams DOB: 14-Nov-1946 Encounter date: 11/11/2021  This is a 75 y.o. female who presents with Chief Complaint  Patient presents with   Fall    Patient states the has her foot up on a step to tie her shoe, lost her balance and fell backwards and back hit the corner of the cabinet, complains of left sided-mid back pain since, used ice    History of present illness:  Ice helps momentarily. Sore if she moves at all. Hurt more yesterday. Taking tylenol.   Does have some vertigo anyway, but this wasn't like that. Just lost balance. Does not feel that balance is typically an issue for her, but does note that balance is worse overall from where it was in the past.  Takes the ultram regularly; so hasn't noted much improvement with this. Rates pain as 8/10.  Notes more issues with vertigo typically with changes in weather. Tends to use half meclizine at a time.    Allergies  Allergen Reactions   Cymbalta [Duloxetine Hcl] Nausea Only    Adverse reaction caused dizziness and nausea   Elemental Sulfur Swelling and Other (See Comments)    Reaction:  All over body swelling    Penicillins Rash and Other (See Comments)    Has patient had a PCN reaction causing immediate rash, facial/tongue/throat swelling, SOB or lightheadedness with hypotension: No Has patient had a PCN reaction causing severe rash involving mucus membranes or skin necrosis: No Has patient had a PCN reaction that required hospitalization No Has patient had a PCN reaction occurring within the last 10 years: No If all of the above answers are "NO", then may proceed with Cephalosporin use.   Current Meds  Medication Sig   alendronate (FOSAMAX) 70 MG tablet TAKE 1 TABLET BY MOUTH  WEEKLY WITH 8 OZ OF PLAIN  WATER 30 MINUTES BEFORE  FIRST FOOD, DRINK OR MEDS.  STAY UPRIGHT FOR 30 MINS   Apoaequorin (PREVAGEN EXTRA STRENGTH) 20 MG CAPS Take 1 tablet by mouth daily.   b complex vitamins capsule Take 1 capsule by mouth  daily.   Calcium-Phosphorus-Vitamin D [phone] MG-MG-UNIT CHEW Chew by mouth.   cetirizine (ZYRTEC) 10 MG tablet Take 10 mg by mouth at bedtime.   Cholecalciferol (VITAMIN D3) 50 MCG (2000 UT) TABS Take by mouth daily.   citalopram (CELEXA) 10 MG tablet Take 1 tablet (10 mg total) by mouth daily.   ferrous sulfate 325 (65 FE) MG tablet Take 325 mg by mouth daily with breakfast.   fluticasone (FLONASE) 50 MCG/ACT nasal spray SHAKE LIQUID AND USE 1 SPRAY IN EACH NOSTRIL TWICE DAILY AS NEEDED FOR ALLERGIES OR RHINITIS   glucosamine-chondroitin 500-400 MG tablet Take 1 tablet by mouth 2 (two) times daily.   losartan (COZAAR) 50 MG tablet TAKE 1 TABLET BY MOUTH  DAILY   meclizine (ANTIVERT) 25 MG tablet TAKE 1/2 TO 1 TABLET BY  MOUTH TWICE DAILY AS NEEDED FOR DIZZINESS   meloxicam (MOBIC) 15 MG tablet TAKE 1 TABLET BY MOUTH  DAILY   montelukast (SINGULAIR) 10 MG tablet TAKE 1 TABLET BY MOUTH AT  BEDTIME   Multiple Vitamin (MULTIVITAMIN WITH MINERALS) TABS tablet Take 1 tablet by mouth daily.   omeprazole (PRILOSEC) 20 MG capsule TAKE 1 CAPSULE BY MOUTH  DAILY   polyethylene glycol (MIRALAX / GLYCOLAX) 17 g packet Take 17 g by mouth daily.   pravastatin (PRAVACHOL) 20 MG tablet TAKE 1 TABLET BY MOUTH  DAILY   traMADol (ULTRAM) 50 MG tablet  TAKE 1 TABLET(50 MG) BY MOUTH TWICE DAILY AS NEEDED   vitamin C (VITAMIN C) 1000 MG tablet Take 1 tablet (1,000 mg total) by mouth daily.    Review of Systems  Constitutional:  Negative for chills, fatigue and fever.  Respiratory:  Negative for cough, chest tightness, shortness of breath and wheezing.   Cardiovascular:  Negative for chest pain, palpitations and leg swelling.  Musculoskeletal:  Positive for back pain. Negative for neck pain and neck stiffness.  Skin:  Negative for color change and wound.   Objective:  BP (!) 150/70 (BP Location: Left Arm, Patient Position: Sitting, Cuff Size: Normal)    Pulse 80    Temp 98.3 F (36.8 C) (Oral)    Ht 4\' 9"   (1.448 m)    Wt 140 lb 3.2 oz (63.6 kg)    SpO2 98%    BMI 30.34 kg/m   Weight: 140 lb 3.2 oz (63.6 kg)   BP Readings from Last 3 Encounters:  11/11/21 (!) 150/70  08/28/21 (!) 162/80  07/16/21 124/64   Wt Readings from Last 3 Encounters:  11/11/21 140 lb 3.2 oz (63.6 kg)  08/28/21 134 lb 6.4 oz (61 kg)  07/16/21 139 lb (63 kg)    Physical Exam Constitutional:      General: She is not in acute distress.    Appearance: She is well-developed.  Cardiovascular:     Rate and Rhythm: Normal rate and regular rhythm.     Heart sounds: Normal heart sounds. No murmur heard.   No friction rub.  Pulmonary:     Effort: Pulmonary effort is normal. No respiratory distress.     Breath sounds: Normal breath sounds. No wheezing or rales.  Musculoskeletal:     Right lower leg: No edema.     Left lower leg: No edema.     Comments: Scoliosis thoracolumbar spine. Pain withextension, flexion, twisting. She has significant left para thoracic/lumbar muscle spasm, but states that she always has some issues in that area due to scoliosis. She has tenderness left lower posterior and lateral ribs.no bruising or obvious step-offs appreciated. She is more tender left parathoracic-lumbar spine than she is over spine itself.  Neurological:     Mental Status: She is alert and oriented to person, place, and time.  Psychiatric:        Behavior: Behavior normal.    Assessment/Plan 1. Acute left-sided thoracic back pain Continue with tylenol, ultram (normally takes this). Let me know if this is not enough for pain management. Continue with icing. As she progresses, heat may be helpful from muscle spasm standpoint. Discussed gentle stretching of back/torso to work on at home. Pending xray, will advise on next step.  - DG Ribs Unilateral Left; Future - DG THORACOLUMABAR SPINE; Future  2. Balance problem This is ongoing; she is interested in working on some techniques to establish better balance/decrease fall  risk.we will refer to PT for eval/treatments. She is motivated to continue with home exercise plan once she works with PT. - Ambulatory referral to Physical Therapy    Return for pending .    Micheline Rough, MD

## 2021-11-11 NOTE — Patient Instructions (Signed)
Therma care wraps if you want heat

## 2021-11-12 ENCOUNTER — Encounter: Payer: Self-pay | Admitting: Family Medicine

## 2021-11-14 ENCOUNTER — Other Ambulatory Visit: Payer: Self-pay | Admitting: Family Medicine

## 2021-11-14 DIAGNOSIS — M546 Pain in thoracic spine: Secondary | ICD-10-CM

## 2021-11-14 DIAGNOSIS — R937 Abnormal findings on diagnostic imaging of other parts of musculoskeletal system: Secondary | ICD-10-CM

## 2021-11-14 NOTE — Progress Notes (Signed)
I have put in orders

## 2021-11-24 ENCOUNTER — Ambulatory Visit
Admission: RE | Admit: 2021-11-24 | Discharge: 2021-11-24 | Disposition: A | Discharge status: Home / Self Care | Payer: Medicare Other | Source: Ambulatory Visit | Attending: Family Medicine | Admitting: Family Medicine

## 2021-11-24 DIAGNOSIS — M47814 Spondylosis without myelopathy or radiculopathy, thoracic region: Secondary | ICD-10-CM | POA: Diagnosis not present

## 2021-11-24 DIAGNOSIS — S299XXA Unspecified injury of thorax, initial encounter: Secondary | ICD-10-CM | POA: Diagnosis not present

## 2021-11-24 DIAGNOSIS — M4804 Spinal stenosis, thoracic region: Secondary | ICD-10-CM | POA: Diagnosis not present

## 2021-11-24 DIAGNOSIS — M419 Scoliosis, unspecified: Secondary | ICD-10-CM | POA: Diagnosis not present

## 2021-11-24 DIAGNOSIS — R937 Abnormal findings on diagnostic imaging of other parts of musculoskeletal system: Secondary | ICD-10-CM

## 2021-11-24 DIAGNOSIS — M546 Pain in thoracic spine: Secondary | ICD-10-CM

## 2021-11-26 ENCOUNTER — Other Ambulatory Visit: Payer: Self-pay

## 2021-11-26 DIAGNOSIS — H8110 Benign paroxysmal vertigo, unspecified ear: Secondary | ICD-10-CM

## 2021-11-26 MED ORDER — MECLIZINE HCL 25 MG PO TABS: ORAL_TABLET | ORAL | 1 refills | Status: AC

## 2021-11-27 ENCOUNTER — Encounter: Payer: Self-pay | Admitting: Family Medicine

## 2021-11-29 ENCOUNTER — Other Ambulatory Visit: Payer: Medicare Other

## 2021-11-30 ENCOUNTER — Telehealth: Payer: Self-pay | Admitting: Pharmacist

## 2021-11-30 NOTE — Progress Notes (Signed)
Chronic Care Management Pharmacy Assistant   Name: Tabitha Adams  MRN: [id number] DOB: 1945/12/01  Reason for Encounter: Disease State Hypertension Assessment    Conditions to be addressed/monitored: HTN  Recent office visits:  12/07/21 [name], [name], MD - Patient presented for Other fatigue and other concerns. Prescribed Amlodipine Besylate 2.5 mg  11/11/21 Tabitha Macadam, MD - Patient presented for Acute left sided thoracic back pain and other concerns. No medication changes.  Recent consult visits:  None  Hospital visits:  None in previous 6 months  Medications: Outpatient Encounter Medications as of 11/30/2021  Medication Sig   alendronate (FOSAMAX) 70 MG tablet TAKE 1 TABLET BY MOUTH  WEEKLY WITH 8 OZ OF PLAIN  WATER 30 MINUTES BEFORE  FIRST FOOD, DRINK OR MEDS.  STAY UPRIGHT FOR 30 MINS   Apoaequorin (PREVAGEN EXTRA STRENGTH) 20 MG CAPS Take 1 tablet by mouth daily.   b complex vitamins capsule Take 1 capsule by mouth daily.   Calcium-Phosphorus-Vitamin D [phone] MG-MG-UNIT CHEW Chew by mouth.   cetirizine (ZYRTEC) 10 MG tablet Take 10 mg by mouth at bedtime.   Cholecalciferol (VITAMIN D3) 50 MCG (2000 UT) TABS Take by mouth daily.   citalopram (CELEXA) 10 MG tablet Take 1 tablet (10 mg total) by mouth daily.   ferrous sulfate 325 (65 FE) MG tablet Take 325 mg by mouth daily with breakfast.   fluticasone (FLONASE) 50 MCG/ACT nasal spray SHAKE LIQUID AND USE 1 SPRAY IN EACH NOSTRIL TWICE DAILY AS NEEDED FOR ALLERGIES OR RHINITIS   glucosamine-chondroitin 500-400 MG tablet Take 1 tablet by mouth 2 (two) times daily.   losartan (COZAAR) 50 MG tablet TAKE 1 TABLET BY MOUTH  DAILY   meclizine (ANTIVERT) 25 MG tablet TAKE 1/2 TO 1 TABLET BY  MOUTH TWICE DAILY AS NEEDED FOR DIZZINESS   meloxicam (MOBIC) 15 MG tablet TAKE 1 TABLET BY MOUTH  DAILY   montelukast (SINGULAIR) 10 MG tablet TAKE 1 TABLET BY MOUTH AT  BEDTIME   Multiple Vitamin (MULTIVITAMIN WITH  MINERALS) TABS tablet Take 1 tablet by mouth daily.   omeprazole (PRILOSEC) 20 MG capsule TAKE 1 CAPSULE BY MOUTH  DAILY   polyethylene glycol (MIRALAX / GLYCOLAX) 17 Adams packet Take 17 Adams by mouth daily.   pravastatin (PRAVACHOL) 20 MG tablet TAKE 1 TABLET BY MOUTH  DAILY   traMADol (ULTRAM) 50 MG tablet TAKE 1 TABLET(50 MG) BY MOUTH TWICE DAILY AS NEEDED   vitamin C (VITAMIN C) 1000 MG tablet Take 1 tablet (1,000 mg total) by mouth daily.   No facility-administered encounter medications on file as of 11/30/2021.  Reviewed chart prior to disease state call. Spoke with patient regarding BP  Recent Office Vitals: BP Readings from Last 3 Encounters:  11/11/21 140/70  08/28/21 (!) 162/80  07/16/21 124/64   Pulse Readings from Last 3 Encounters:  11/11/21 80  08/28/21 82  07/16/21 84    Wt Readings from Last 3 Encounters:  11/11/21 140 lb 3.2 oz (63.6 kg)  08/28/21 134 lb 6.4 oz (61 kg)  07/16/21 139 lb (63 kg)     Kidney Function Lab Results  Component Value Date/Time   CREATININE 0.75 09/02/2021 11:41 AM   CREATININE 0.79 02/24/2021 07:52 AM   CREATININE 0.72 08/06/2020 03:34 PM   GFR 77.81 09/02/2021 11:41 AM   GFRNONAA 82 08/06/2020 03:34 PM   GFRAA 96 08/06/2020 03:34 PM    BMP Latest Ref Rng & Units 09/02/2021 02/24/2021 08/06/2020  Glucose 70 -  99 mg/dL 95 84 115(H)  BUN 6 - 23 mg/dL 15 15 13   Creatinine 0.40 - 1.20 mg/dL 0.75 0.79 0.72  BUN/Creat Ratio 6 - 22 (calc) - - NOT APPLICABLE  Sodium 237 - 145 mEq/L 134(L) 134(L) 137  Potassium 3.5 - 5.1 mEq/L 4.0 4.4 4.1  Chloride 96 - 112 mEq/L 98 98 103  CO2 19 - 32 mEq/L 28 28 29   Calcium 8.4 - 10.5 mg/dL 10.1 9.9 9.3   11/30/21 Current antihypertensive regimen:  Losartan 50 mg 1 tablet daily How often are you checking your Blood Pressure? daily Current home BP readings: Patient checked over the phone on today was 162/93 she reports she has taken her medication around 8 am this morning. Per Tabitha Adams with elevated reading  moved up her follow up appointment with her. Patient aware, she also reports she has been tired and experiencing some hair loss, advised when we hung up she should call the office to schedule an appointment with her PCP to have things checked out, she agreed she would. What recent interventions/DTPs have been made by any provider to improve Blood Pressure control since last CPP Visit: Patient reports no change Any recent hospitalizations or ED visits since last visit with CPP? No  Adherence Review: Is the patient currently on ACE/ARB medication? No Does the patient have >5 day gap between last estimated fill dates? No  12/10/21 Call to check back in with patient she reports since we spoke she was prescribed a new B medication but had not picked it up or started it as of yet, advised we would touch base again next month to see how its working.   Care Gaps: Zoster Vaccines - Overdue COVID Booster #4 [employer]) - Overdue AWV - Done 07/16/21 BP - 162/93 11/30/21  Star Rating Drugs: Pravastatin (Pravachol) 20 mg - Last filled 11/25/2020 90 DS at [facility] Losartan (Cozaar) 50 mg - Last filled 11/25/2020 90 DS at [facility] Pharmacist Assistant [phone]

## 2021-12-04 NOTE — Progress Notes (Signed)
ACUTE VISIT Chief Complaint  Patient presents with   Alopecia   Fatigue   HPI: [name] is a 76 y.o. female with hx of HTN,generalized OA,HTN,vertigo,essential tremor,and GERD here today complaining of hair loss and fatigue.  A coupled months of fatigue. Sleeping about 6-7 hours. Falling asleep when watching TV but not while driving. She sometimes does not feel rested in the morning when she gets up.  Mood "pretty good." She is on Celexa 10 mg daily for anxiety and depression, no new stressors, still working part time. Chronic pain on Tramadol.  Today she saw ortho for back pain. Fall in 10/2021. MRI thoracic was abnormal, DDD. Pending lumbar MRI.  Decreased appetite.  Parietal hair loss noted a few weeks ago. No scalp lesions. She has not tried OTC meds.  She has had mild anemia in the past. She has not noted melena,blood in stool,or gross hematuria. 01/2018 H/H was 11.1-11.7/31.9-33.8.  Component     Latest Ref Rng & Units 02/24/2021  WBC     4.0 - 10.5 K/uL 5.4  RBC     3.87 - 5.11 Mil/uL 4.24  Hemoglobin     12.0 - 15.0 g/dL 12.0  HCT     36.0 - 46.0 % 35.3 (L)  MCV     78.0 - 100.0 fl 83.2  MCHC     30.0 - 36.0 g/dL 34.0  RDW     11.5 - 15.5 % 12.9  Platelets     150.0 - 400.0 K/uL 297.0  Neutrophils     43.0 - 77.0 % 59.8  Lymphocytes     12.0 - 46.0 % 27.1  Monocytes Relative     3.0 - 12.0 % 9.0  Eosinophil     0.0 - 5.0 % 3.9  Basophil     0.0 - 3.0 % 0.2  NEUT#     1.4 - 7.7 K/uL 3.2  Lymphocyte #     0.7 - 4.0 K/uL 1.5  Monocyte #     0.1 - 1.0 K/uL 0.5  Eosinophils Absolute     0.0 - 0.7 K/uL 0.2  Basophils Absolute     0.0 - 0.1 K/uL 0.0  MCH     26.0 - 34.0 pg    Lost 2 frontal teeth in the past few months,not traumatic. She is following with dentist.  She just started taking Biotin and supplementation that is supposed to help with nails/hair/skin.  Headache, daily for the past couple weeks. Hx of headache but  getting more frequent. Fronto occipital, 5/10, achy,intermittent. She has not identified exacerbating or alleviating factors.  No associated symptoms. Nausea this past weekend thought to be due to back pain. Hit head when fell in 10/2021.  Home BP today 160/?. Negative for visual changes, chest pain, dyspnea, palpitation, focal weakness, or edema.  Lab Results  Component Value Date   CREATININE 0.75 09/02/2021   BUN 15 09/02/2021   NA 134 (L) 09/02/2021   K 4.0 09/02/2021   CL 98 09/02/2021   CO2 28 09/02/2021   Review of Systems  Constitutional:  Positive for appetite change. Negative for activity change and fever.  HENT:  Negative for mouth sores, nosebleeds and sore throat.   Respiratory:  Negative for cough and wheezing.   Gastrointestinal:  Negative for abdominal pain and vomiting.       Negative for changes in bowel habits.  Endocrine: Negative for cold intolerance and heat intolerance.  Genitourinary:  Negative for decreased urine volume,  dysuria and hematuria.  Musculoskeletal:  Positive for arthralgias, back pain and gait problem.  Skin:  Negative for rash.  Neurological:  Negative for syncope and facial asymmetry.  Psychiatric/Behavioral:  Negative for confusion.   Rest see pertinent positives and negatives per HPI.  Current Outpatient Medications on File Prior to Visit  Medication Sig Dispense Refill   alendronate (FOSAMAX) 70 MG tablet TAKE 1 TABLET BY MOUTH  WEEKLY WITH 8 OZ OF PLAIN  WATER 30 MINUTES BEFORE  FIRST FOOD, DRINK OR MEDS.  STAY UPRIGHT FOR 30 MINS 12 tablet 3   Apoaequorin (PREVAGEN EXTRA STRENGTH) 20 MG CAPS Take 1 tablet by mouth daily.     b complex vitamins capsule Take 1 capsule by mouth daily.     Calcium-Phosphorus-Vitamin D [phone] MG-MG-UNIT CHEW Chew by mouth.     cetirizine (ZYRTEC) 10 MG tablet Take 10 mg by mouth at bedtime.     Cholecalciferol (VITAMIN D3) 50 MCG (2000 UT) TABS Take by mouth daily.     citalopram (CELEXA) 10 MG  tablet Take 1 tablet (10 mg total) by mouth daily. 90 tablet 1   ferrous sulfate 325 (65 FE) MG tablet Take 325 mg by mouth daily with breakfast.     fluticasone (FLONASE) 50 MCG/ACT nasal spray SHAKE LIQUID AND USE 1 SPRAY IN EACH NOSTRIL TWICE DAILY AS NEEDED FOR ALLERGIES OR RHINITIS 16 g 11   glucosamine-chondroitin 500-400 MG tablet Take 1 tablet by mouth 2 (two) times daily.     losartan (COZAAR) 50 MG tablet TAKE 1 TABLET BY MOUTH  DAILY 90 tablet 3   meclizine (ANTIVERT) 25 MG tablet TAKE 1/2 TO 1 TABLET BY  MOUTH TWICE DAILY AS NEEDED FOR DIZZINESS 90 tablet 1   meloxicam (MOBIC) 15 MG tablet TAKE 1 TABLET BY MOUTH  DAILY 90 tablet 3   montelukast (SINGULAIR) 10 MG tablet TAKE 1 TABLET BY MOUTH AT  BEDTIME 90 tablet 3   Multiple Vitamin (MULTIVITAMIN WITH MINERALS) TABS tablet Take 1 tablet by mouth daily.     omeprazole (PRILOSEC) 20 MG capsule TAKE 1 CAPSULE BY MOUTH  DAILY 90 capsule 3   polyethylene glycol (MIRALAX / GLYCOLAX) 17 g packet Take 17 g by mouth daily.     pravastatin (PRAVACHOL) 20 MG tablet TAKE 1 TABLET BY MOUTH  DAILY 90 tablet 3   traMADol (ULTRAM) 50 MG tablet TAKE 1 TABLET(50 MG) BY MOUTH TWICE DAILY AS NEEDED 60 tablet 3   vitamin C (VITAMIN C) 1000 MG tablet Take 1 tablet (1,000 mg total) by mouth daily. 30 tablet 0   No current facility-administered medications on file prior to visit.   Past Medical History:  Diagnosis Date   Allergy    Anemia    Arthritis    GERD (gastroesophageal reflux disease)    Hyperlipidemia    Allergies  Allergen Reactions   Cymbalta [Duloxetine Hcl] Nausea Only    Adverse reaction caused dizziness and nausea   Elemental Sulfur Swelling and Other (See Comments)    Reaction:  All over body swelling    Penicillins Rash and Other (See Comments)    Has patient had a PCN reaction causing immediate rash, facial/tongue/throat swelling, SOB or lightheadedness with hypotension: No Has patient had a PCN reaction causing severe rash  involving mucus membranes or skin necrosis: No Has patient had a PCN reaction that required hospitalization No Has patient had a PCN reaction occurring within the last 10 years: No If all of the above answers  are "NO", then may proceed with Cephalosporin use.   Social History   Socioeconomic History   Marital status: Widowed    Spouse name: Not on file   Number of children: 3   Years of education: Not on file   Highest education level: Some college, no degree  Occupational History   Occupation: Surveyor, quantity: [employer]    Comment: works 25-30hrs/week  Tobacco Use   Smoking status: Former   Smokeless tobacco: Never  Scientific laboratory technician Use: Never used  Substance and Sexual Activity   Alcohol use: No   Drug use: No   Sexual activity: Not Currently    Comment: 1st intercourse 76 yo-Fewer than 5 partners  Other Topics Concern   Not on file  Social History Narrative   01/22/2019:   Widowed since 10/2017; lives alone now in [place] house with 2 dogs   Has one son, and one stepson and Psychiatrist. Sons live close by, supportive.   Has 5 grandchildren and several great grandchildren, active in their lives   Continues to work PT as Scientist, water quality, likes to stay busy   [employer] community support system   Social Determinants of Radio broadcast assistant Strain: Medium Risk   Difficulty of Paying Living Expenses: Somewhat hard  Food Insecurity: Unknown   Worried About Charity fundraiser in the Last Year: Patient refused   Arboriculturist in the Last Year: Patient refused  Transportation Needs: No Data processing manager (Medical): No   Lack of Transportation (Non-Medical): No  Physical Activity: Unknown   Days of Exercise per Week: Patient refused   Minutes of Exercise per Session: 10 min  Stress: No Stress Concern Present   Feeling of Stress : Only a little  Social Connections: Unknown   Frequency of Communication with Friends and Family: More than  three times a week   Frequency of Social Gatherings with Friends and Family: Three times a week   Attends Religious Services: Patient refused   Active Member of Clubs or Organizations: Yes   Attends Archivist Meetings: More than 4 times per year   Marital Status: Widowed   Vitals:   12/07/21 1526  BP: (!) 148/80  Pulse: 90  Resp: 16  SpO2: 99%   Wt Readings from Last 3 Encounters:  12/07/21 138 lb (62.6 kg)  11/11/21 140 lb 3.2 oz (63.6 kg)  08/28/21 134 lb 6.4 oz (61 kg)   Body mass index is 29.86 kg/m.  Physical Exam Vitals and nursing note reviewed.  Constitutional:      General: She is not in acute distress.    Appearance: She is well-developed.  HENT:     Head: Normocephalic and atraumatic.     Mouth/Throat:     Mouth: Mucous membranes are moist.     Dentition: Abnormal dentition (Missing teeth.).  Eyes:     Conjunctiva/sclera: Conjunctivae normal.  Cardiovascular:     Rate and Rhythm: Normal rate and regular rhythm.     Heart sounds: No murmur heard. Pulmonary:     Effort: Pulmonary effort is normal. No respiratory distress.     Breath sounds: Normal breath sounds.  Abdominal:     Palpations: Abdomen is soft. There is no mass.     Tenderness: There is no abdominal tenderness.  Musculoskeletal:     Cervical back: Tenderness present. No bony tenderness.     Thoracic back: Scoliosis present.  Back:  Lymphadenopathy:     Cervical: No cervical adenopathy.  Skin:    General: Skin is warm.     Findings: No erythema or rash.     Comments: Parietal and some temporal hair thinning and mild alopecia.No scalp lesions. Hair pull test negative.  Neurological:     General: No focal deficit present.     Mental Status: She is alert and oriented to person, place, and time.     Cranial Nerves: No cranial nerve deficit.     Motor: Tremor (Hand,chronic) present.     Comments: Antalgic,mildly unstable gait assisted with a cane/stick.  Psychiatric:      Comments: Well groomed, good eye contact.   ASSESSMENT AND PLAN:  [name] was seen today for alopecia and fatigue.  Diagnoses and all orders for this visit: Orders Placed This Encounter  Procedures   Basic metabolic panel   CBC   TSH   C-reactive protein   Ferritin   Iron   Lab Results  Component Value Date   WBC 6.1 12/07/2021   HGB 11.3 (L) 12/07/2021   HCT 33.1 (L) 12/07/2021   MCV 84.3 12/07/2021   PLT 317.0 12/07/2021   Lab Results  Component Value Date   TSH 0.84 12/07/2021   Lab Results  Component Value Date   CRP <1.0 12/07/2021   Lab Results  Component Value Date   CREATININE 0.83 12/07/2021   BUN 16 12/07/2021   NA 135 12/07/2021   K 4.1 12/07/2021   CL 99 12/07/2021   CO2 29 12/07/2021   Other fatigue Reviewed possible causes. Some of her chronic medical problems and medications can be contributing factors. Further recommendations according to lab results.  Hair loss disorder Not scarring. We discussed possible etiologies. OTC Rogaine may help. Further recommendations according to lab results. Derma evaluation can be considered if problem does not improved and/or no clear etiology identified.  Headache, unspecified headache type Examination today and hx do not suggest a serious process. She agrees with holding on imaging but clearly instructed about warning signs.  Essential hypertension, benign Re-checked 160/80. Amlodipine 2.5 mg added at bedtime. Continue Losartan 50 mg daily. Low salt diet and continue monitoring BP at home. We discussed possible complications of elevated BP.  Return if symptoms worsen or fail to improve, for Keep next appt.  Kalista Laguardia G. [name], MD  [facility] [place] office.

## 2021-12-07 ENCOUNTER — Encounter: Payer: Self-pay | Admitting: Family Medicine

## 2021-12-07 ENCOUNTER — Ambulatory Visit (INDEPENDENT_AMBULATORY_CARE_PROVIDER_SITE_OTHER): Payer: Medicare Other | Admitting: Family Medicine

## 2021-12-07 VITALS — BP 148/80 | HR 90 | Resp 16 | Ht <= 58 in | Wt 138.0 lb

## 2021-12-07 DIAGNOSIS — D509 Iron deficiency anemia, unspecified: Secondary | ICD-10-CM

## 2021-12-07 DIAGNOSIS — M5416 Radiculopathy, lumbar region: Secondary | ICD-10-CM | POA: Diagnosis not present

## 2021-12-07 DIAGNOSIS — R5383 Other fatigue: Secondary | ICD-10-CM

## 2021-12-07 DIAGNOSIS — R519 Headache, unspecified: Secondary | ICD-10-CM | POA: Diagnosis not present

## 2021-12-07 DIAGNOSIS — I10 Essential (primary) hypertension: Secondary | ICD-10-CM

## 2021-12-07 DIAGNOSIS — L659 Nonscarring hair loss, unspecified: Secondary | ICD-10-CM

## 2021-12-07 DIAGNOSIS — M545 Low back pain, unspecified: Secondary | ICD-10-CM | POA: Diagnosis not present

## 2021-12-07 MED ORDER — AMLODIPINE BESYLATE 2.5 MG PO TABS: 2.5000 mg | ORAL_TABLET | Freq: Every day | ORAL | 0 refills | Status: DC

## 2021-12-07 NOTE — Assessment & Plan Note (Addendum)
Re-checked 160/80. Amlodipine 2.5 mg added at bedtime. Continue Losartan 50 mg daily. Low salt diet and continue monitoring BP at home. We discussed possible complications of elevated BP.

## 2021-12-07 NOTE — Patient Instructions (Addendum)
A few things to remember from today's visit:   Other fatigue - Plan: Basic metabolic panel, TSH, C-reactive protein  Hair loss disorder - Plan: C-reactive protein  Headache, unspecified headache type - Plan: CBC  Essential hypertension, benign - Plan: amLODipine (NORVASC) 2.5 MG tablet  If you need refills please call your pharmacy. Do not use My Chart to request refills or for acute issues that need immediate attention. Over the counter Rogaine may help with hair loss.  Alopecia areata is a condition that causes hair loss, usually rounded scalp patches with no hair.  Today Amlodipine 2.5 mg at bedtime added for hypertension. Continue Losartan. Monitor blood pressure at home. We could change Tramadol for hydrocodone if needed for pain management.  Let me know if you need a note for work.  Please be sure medication list is accurate. If a new problem present, please set up appointment sooner than planned today.

## 2021-12-08 ENCOUNTER — Other Ambulatory Visit: Payer: Self-pay | Admitting: Orthopedic Surgery

## 2021-12-08 DIAGNOSIS — M545 Low back pain, unspecified: Secondary | ICD-10-CM

## 2021-12-08 LAB — BASIC METABOLIC PANEL
BUN: 16 mg/dL (ref 6–23)
CO2: 29 mEq/L (ref 19–32)
Calcium: 9.5 mg/dL (ref 8.4–10.5)
Chloride: 99 mEq/L (ref 96–112)
Creatinine, Ser: 0.83 mg/dL (ref 0.40–1.20)
GFR: 68.77 mL/min (ref >60.00)
Glucose, Bld: 111 mg/dL — ABNORMAL HIGH (ref 70–99)
Potassium: 4.1 mEq/L (ref 3.5–5.1)
Sodium: 135 mEq/L (ref 135–145)

## 2021-12-08 LAB — CBC
HCT: 33.1 % — ABNORMAL LOW (ref 36.0–46.0)
Hemoglobin: 11.3 g/dL — ABNORMAL LOW (ref 12.0–15.0)
MCHC: 34 g/dL (ref 30.0–36.0)
MCV: 84.3 fl (ref 78.0–100.0)
Platelets: 317 10*3/uL (ref 150.0–400.0)
RBC: 3.93 Mil/uL (ref 3.87–5.11)
RDW: 13.2 % (ref 11.5–15.5)
WBC: 6.1 10*3/uL (ref 4.0–10.5)

## 2021-12-08 LAB — C-REACTIVE PROTEIN: CRP: <1 mg/dL (ref 0.5–20.0)

## 2021-12-08 LAB — TSH: TSH: 0.84 u[IU]/mL (ref 0.35–5.50)

## 2021-12-09 ENCOUNTER — Encounter: Payer: Self-pay | Admitting: Family Medicine

## 2021-12-10 ENCOUNTER — Other Ambulatory Visit (INDEPENDENT_AMBULATORY_CARE_PROVIDER_SITE_OTHER): Payer: Medicare Other

## 2021-12-10 DIAGNOSIS — D509 Iron deficiency anemia, unspecified: Secondary | ICD-10-CM

## 2021-12-11 LAB — FERRITIN: Ferritin: 201.8 ng/mL (ref 10.0–291.0)

## 2021-12-11 LAB — IRON: Iron: 87 ug/dL (ref 42–145)

## 2021-12-16 ENCOUNTER — Other Ambulatory Visit (INDEPENDENT_AMBULATORY_CARE_PROVIDER_SITE_OTHER): Payer: Medicare Other

## 2021-12-16 DIAGNOSIS — D509 Iron deficiency anemia, unspecified: Secondary | ICD-10-CM | POA: Diagnosis not present

## 2021-12-16 LAB — FECAL OCCULT BLOOD, IMMUNOCHEMICAL: Fecal Occult Bld: NEGATIVE

## 2021-12-20 ENCOUNTER — Ambulatory Visit
Admission: RE | Admit: 2021-12-20 | Discharge: 2021-12-20 | Disposition: A | Discharge status: Home / Self Care | Payer: Medicare Other | Source: Ambulatory Visit | Attending: Orthopedic Surgery | Admitting: Orthopedic Surgery

## 2021-12-20 ENCOUNTER — Other Ambulatory Visit: Payer: Self-pay

## 2021-12-20 DIAGNOSIS — M545 Low back pain, unspecified: Secondary | ICD-10-CM | POA: Diagnosis not present

## 2021-12-20 DIAGNOSIS — M4126 Other idiopathic scoliosis, lumbar region: Secondary | ICD-10-CM | POA: Diagnosis not present

## 2021-12-20 DIAGNOSIS — R200 Anesthesia of skin: Secondary | ICD-10-CM | POA: Diagnosis not present

## 2021-12-20 DIAGNOSIS — M2578 Osteophyte, vertebrae: Secondary | ICD-10-CM | POA: Diagnosis not present

## 2021-12-20 DIAGNOSIS — M48061 Spinal stenosis, lumbar region without neurogenic claudication: Secondary | ICD-10-CM | POA: Diagnosis not present

## 2021-12-22 ENCOUNTER — Other Ambulatory Visit: Payer: Self-pay | Admitting: Family Medicine

## 2021-12-22 DIAGNOSIS — G894 Chronic pain syndrome: Secondary | ICD-10-CM

## 2021-12-22 DIAGNOSIS — M159 Polyosteoarthritis, unspecified: Secondary | ICD-10-CM

## 2021-12-25 DIAGNOSIS — M545 Low back pain, unspecified: Secondary | ICD-10-CM | POA: Diagnosis not present

## 2021-12-31 DIAGNOSIS — M4807 Spinal stenosis, lumbosacral region: Secondary | ICD-10-CM | POA: Diagnosis not present

## 2022-01-04 DIAGNOSIS — M47816 Spondylosis without myelopathy or radiculopathy, lumbar region: Secondary | ICD-10-CM | POA: Diagnosis not present

## 2022-01-05 DIAGNOSIS — M4807 Spinal stenosis, lumbosacral region: Secondary | ICD-10-CM | POA: Diagnosis not present

## 2022-01-11 ENCOUNTER — Other Ambulatory Visit: Payer: Self-pay | Admitting: Family Medicine

## 2022-01-11 DIAGNOSIS — F330 Major depressive disorder, recurrent, mild: Secondary | ICD-10-CM

## 2022-01-14 DIAGNOSIS — M4807 Spinal stenosis, lumbosacral region: Secondary | ICD-10-CM | POA: Diagnosis not present

## 2022-01-19 DIAGNOSIS — M533 Sacrococcygeal disorders, not elsewhere classified: Secondary | ICD-10-CM | POA: Diagnosis not present

## 2022-01-26 DIAGNOSIS — M4807 Spinal stenosis, lumbosacral region: Secondary | ICD-10-CM | POA: Diagnosis not present

## 2022-02-02 DIAGNOSIS — M4807 Spinal stenosis, lumbosacral region: Secondary | ICD-10-CM | POA: Diagnosis not present

## 2022-02-08 DIAGNOSIS — M533 Sacrococcygeal disorders, not elsewhere classified: Secondary | ICD-10-CM | POA: Diagnosis not present

## 2022-02-16 ENCOUNTER — Telehealth: Payer: Medicare Other

## 2022-02-17 ENCOUNTER — Other Ambulatory Visit: Payer: Self-pay | Admitting: Family Medicine

## 2022-02-17 DIAGNOSIS — J309 Allergic rhinitis, unspecified: Secondary | ICD-10-CM

## 2022-03-16 ENCOUNTER — Other Ambulatory Visit: Payer: Self-pay | Admitting: Family Medicine

## 2022-03-16 DIAGNOSIS — I10 Essential (primary) hypertension: Secondary | ICD-10-CM

## 2022-04-19 ENCOUNTER — Emergency Department (HOSPITAL_BASED_OUTPATIENT_CLINIC_OR_DEPARTMENT_OTHER): Payer: Medicare Other

## 2022-04-19 ENCOUNTER — Encounter (HOSPITAL_BASED_OUTPATIENT_CLINIC_OR_DEPARTMENT_OTHER): Payer: Self-pay | Admitting: Obstetrics and Gynecology

## 2022-04-19 ENCOUNTER — Emergency Department (HOSPITAL_BASED_OUTPATIENT_CLINIC_OR_DEPARTMENT_OTHER)
Admission: EM | Admit: 2022-04-19 | Discharge: 2022-04-19 | Disposition: A | Discharge status: Home / Self Care | Payer: Medicare Other | Attending: Emergency Medicine | Admitting: Emergency Medicine

## 2022-04-19 ENCOUNTER — Other Ambulatory Visit: Payer: Self-pay

## 2022-04-19 DIAGNOSIS — M4312 Spondylolisthesis, cervical region: Secondary | ICD-10-CM | POA: Diagnosis not present

## 2022-04-19 DIAGNOSIS — S0990XA Unspecified injury of head, initial encounter: Secondary | ICD-10-CM | POA: Diagnosis present

## 2022-04-19 DIAGNOSIS — R519 Headache, unspecified: Secondary | ICD-10-CM | POA: Diagnosis not present

## 2022-04-19 DIAGNOSIS — Z79899 Other long term (current) drug therapy: Secondary | ICD-10-CM | POA: Insufficient documentation

## 2022-04-19 DIAGNOSIS — W19XXXA Unspecified fall, initial encounter: Secondary | ICD-10-CM | POA: Insufficient documentation

## 2022-04-19 DIAGNOSIS — S0101XA Laceration without foreign body of scalp, initial encounter: Secondary | ICD-10-CM | POA: Insufficient documentation

## 2022-04-19 DIAGNOSIS — M542 Cervicalgia: Secondary | ICD-10-CM | POA: Diagnosis not present

## 2022-04-19 NOTE — ED Notes (Signed)
ED Provider at bedside. 

## 2022-04-19 NOTE — ED Triage Notes (Signed)
Patient reports to the ER for head injury. Patient reports she was trying to get her shoe away from her puppy and fell backwards. Patient reports she is on chronic NSAIDs but no blood thinners. Denies LOC.

## 2022-04-19 NOTE — ED Provider Notes (Signed)
[facility] DEPT Provider Note   CSN: [id number] Arrival date & time: 04/19/22  1657     History {Add pertinent medical, surgical, social history, OB history to HPI:1} Chief Complaint  Patient presents with  . Head Injury    Tabitha Adams is a 76 y.o. female.   Head Injury     Home Medications Prior to Admission medications   Medication Sig Start Date End Date Taking? Authorizing Provider  alendronate (FOSAMAX) 70 MG tablet TAKE 1 TABLET BY MOUTH  WEEKLY WITH 8 OZ OF PLAIN  WATER 30 MINUTES BEFORE  FIRST FOOD, DRINK OR MEDS.  STAY UPRIGHT FOR 30 MINS 10/26/21   [name], [name], MD  amLODipine (NORVASC) 2.5 MG tablet TAKE 1 TABLET(2.5 MG) BY MOUTH AT BEDTIME 03/16/22   [name], [name], MD  Apoaequorin [facility] EXTRA STRENGTH) 20 MG CAPS Take 1 tablet by mouth daily.    [provider]  b complex vitamins capsule Take 1 capsule by mouth daily.    [provider]  Calcium-Phosphorus-Vitamin D [phone] MG-MG-UNIT CHEW Chew by mouth.    [provider]  cetirizine (ZYRTEC) 10 MG tablet Take 10 mg by mouth at bedtime.    [provider]  Cholecalciferol (VITAMIN D3) 50 MCG (2000 UT) TABS Take by mouth daily.    [provider]  citalopram (CELEXA) 10 MG tablet TAKE 1 TABLET(10 MG) BY MOUTH DAILY 01/11/22   [name], [name], MD  ferrous sulfate 325 (65 FE) MG tablet Take 325 mg by mouth daily with breakfast.    [provider]  fluticasone (FLONASE) 50 MCG/ACT nasal spray SHAKE LIQUID AND USE 1 SPRAY IN EACH NOSTRIL TWICE DAILY AS NEEDED FOR ALLERGIES OR RHINITIS 02/17/22   [name], [name], MD  glucosamine-chondroitin 500-400 MG tablet Take 1 tablet by mouth 2 (two) times daily.    [provider]  losartan (COZAAR) 50 MG tablet TAKE 1 TABLET BY MOUTH  DAILY 10/28/21   [name], [name], MD  meclizine (ANTIVERT) 25 MG tablet TAKE 1/2 TO 1 TABLET BY  MOUTH TWICE DAILY AS NEEDED FOR DIZZINESS  11/26/21   [name], [name], MD  meloxicam (MOBIC) 15 MG tablet TAKE 1 TABLET BY MOUTH  DAILY 10/28/21   [name], [name], MD  montelukast (SINGULAIR) 10 MG tablet TAKE 1 TABLET BY MOUTH AT  BEDTIME 10/28/21   [name], [name], MD  Multiple Vitamin (MULTIVITAMIN WITH MINERALS) TABS tablet Take 1 tablet by mouth daily.    [provider]  omeprazole (PRILOSEC) 20 MG capsule TAKE 1 CAPSULE BY MOUTH  DAILY 10/28/21   [name], [name], MD  polyethylene glycol (MIRALAX / GLYCOLAX) 17 g packet Take 17 g by mouth daily.    [provider]  pravastatin (PRAVACHOL) 20 MG tablet TAKE 1 TABLET BY MOUTH  DAILY 08/06/21   [name], [name], MD  traMADol (ULTRAM) 50 MG tablet TAKE 1 TABLET(50 MG) BY MOUTH TWICE DAILY AS NEEDED 12/23/21   [name], [name], MD  vitamin C (VITAMIN C) 1000 MG tablet Take 1 tablet (1,000 mg total) by mouth daily. 01/26/17   Lisette Abu, PA-C      Allergies    Cymbalta [duloxetine hcl], Elemental sulfur, and Penicillins    Review of Systems   Review of Systems  Physical Exam Updated Vital Signs BP (!) 171/93 (BP Location: Right Arm)   Pulse 78   Temp 98.2 F (36.8 C)   Resp 19   Ht 4'  11.5" (1.511 m)   Wt 62.1 kg   SpO2 96%   BMI 27.21 kg/m  Physical Exam  ED Results / Procedures / Treatments   Labs (all labs ordered are listed, but only abnormal results are displayed) Labs Reviewed - No data to display  EKG None  Radiology CT Head Wo Contrast  Result Date: 04/19/2022 CLINICAL DATA:  Recent fall with headaches and neck pain, initial encounter EXAM: CT HEAD WITHOUT CONTRAST CT CERVICAL SPINE WITHOUT CONTRAST TECHNIQUE: Multidetector CT imaging of the head and cervical spine was performed following the standard protocol without intravenous contrast. Multiplanar CT image reconstructions of the cervical spine were also generated. RADIATION DOSE REDUCTION: This exam was performed according to the departmental dose-optimization program which includes  automated exposure control, adjustment of the mA and/or kV according to patient size and/or use of iterative reconstruction technique. COMPARISON:  None Available. FINDINGS: CT HEAD FINDINGS Brain: No evidence of acute infarction, hemorrhage, hydrocephalus, extra-axial collection or mass lesion/mass effect. Mild chronic white matter ischemic changes are noted. Vascular: No hyperdense vessel or unexpected calcification. Skull: Normal. Negative for fracture or focal lesion. Sinuses/Orbits: Small air-fluid level is noted within the right maxillary antrum of uncertain chronicity. Other: None CT CERVICAL SPINE FINDINGS Alignment: Within normal limits with the exception of mild degenerative anterolisthesis of C4 on C5. Skull base and vertebrae: 7 cervical segments are well visualized. Vertebral body height is well maintained. Mild osteophytic changes and facet hypertrophic changes are noted with resultant anterolisthesis of C4 on C5. Disc space narrowing at C5-6 and C6-7 is noted. No acute fracture or acute facet abnormality is noted. Soft tissues and spinal canal: Surrounding soft tissue structures are within normal limits. Upper chest: Negative. Other: None IMPRESSION: CT of the head: Mild chronic white matter ischemic changes. No acute intracranial abnormality is noted. Small air-fluid level within the right maxillary antrum of uncertain chronicity. CT of the cervical spine: Multilevel degenerative change without acute abnormality. Electronically Signed   By: Inez Catalina M.D.   On: 04/19/2022 20:16   CT Cervical Spine Wo Contrast  Result Date: 04/19/2022 CLINICAL DATA:  Recent fall with headaches and neck pain, initial encounter EXAM: CT HEAD WITHOUT CONTRAST CT CERVICAL SPINE WITHOUT CONTRAST TECHNIQUE: Multidetector CT imaging of the head and cervical spine was performed following the standard protocol without intravenous contrast. Multiplanar CT image reconstructions of the cervical spine were also generated.  RADIATION DOSE REDUCTION: This exam was performed according to the departmental dose-optimization program which includes automated exposure control, adjustment of the mA and/or kV according to patient size and/or use of iterative reconstruction technique. COMPARISON:  None Available. FINDINGS: CT HEAD FINDINGS Brain: No evidence of acute infarction, hemorrhage, hydrocephalus, extra-axial collection or mass lesion/mass effect. Mild chronic white matter ischemic changes are noted. Vascular: No hyperdense vessel or unexpected calcification. Skull: Normal. Negative for fracture or focal lesion. Sinuses/Orbits: Small air-fluid level is noted within the right maxillary antrum of uncertain chronicity. Other: None CT CERVICAL SPINE FINDINGS Alignment: Within normal limits with the exception of mild degenerative anterolisthesis of C4 on C5. Skull base and vertebrae: 7 cervical segments are well visualized. Vertebral body height is well maintained. Mild osteophytic changes and facet hypertrophic changes are noted with resultant anterolisthesis of C4 on C5. Disc space narrowing at C5-6 and C6-7 is noted. No acute fracture or acute facet abnormality is noted. Soft tissues and spinal canal: Surrounding soft tissue structures are within normal limits. Upper chest: Negative. Other: None IMPRESSION: CT of the head:  Mild chronic white matter ischemic changes. No acute intracranial abnormality is noted. Small air-fluid level within the right maxillary antrum of uncertain chronicity. CT of the cervical spine: Multilevel degenerative change without acute abnormality. Electronically Signed   By: Inez Catalina M.D.   On: 04/19/2022 20:16    Procedures .Marland KitchenLaceration Repair  Date/Time: 04/19/2022 11:08 PM Performed by: Regan Lemming, MD Authorized by: Regan Lemming, MD   Consent:    Consent obtained:  Verbal   Consent given by:  Patient   Risks discussed:  Infection, pain and poor wound healing Universal protocol:    Patient  identity confirmed:  Verbally with patient Anesthesia:    Anesthesia method:  None Laceration details:    Location:  Scalp   Scalp location:  Occipital   Length (cm):  2   Depth (mm):  1 Exploration:    Hemostasis achieved with:  Direct pressure Treatment:    Area cleansed with:  Soap and water   Amount of cleaning:  Standard Skin repair:    Repair method:  Tissue adhesive Approximation:    Approximation:  Close Repair type:    Repair type:  Simple Post-procedure details:    Dressing:  Open (no dressing)   Procedure completion:  Tolerated   {Document cardiac monitor, telemetry assessment procedure when appropriate:1}  Medications Ordered in ED Medications - No data to display  ED Course/ Medical Decision Making/ A&P                           Medical Decision Making Amount and/or Complexity of Data Reviewed Radiology: ordered.   ***  {Document critical care time when appropriate:1} {Document review of labs and clinical decision tools ie heart score, Chads2Vasc2 etc:1}  {Document your independent review of radiology images, and any outside records:1} {Document your discussion with family members, caretakers, and with consultants:1} {Document social determinants of health affecting pt's care:1} {Document your decision making why or why not admission, treatments were needed:1} Final Clinical Impression(s) / ED Diagnoses Final diagnoses:  None    Rx / DC Orders ED Discharge Orders     None

## 2022-04-19 NOTE — ED Notes (Signed)
Scalp laceration cleansed with 50cc NS then wound/dermal cleanser-- exposed thin 1cm horizontal laceration-- no active bleeding noted; thin layer of Bacitracin applied to site after irrigation and cleaning -- Dr Armandina Gemma made aware via secure chat.  Process well tolerated by patient who denies any complaints and denies need for pain med at this time

## 2022-04-23 ENCOUNTER — Other Ambulatory Visit: Payer: Self-pay | Admitting: Family Medicine

## 2022-04-23 DIAGNOSIS — G894 Chronic pain syndrome: Secondary | ICD-10-CM

## 2022-04-23 DIAGNOSIS — M159 Polyosteoarthritis, unspecified: Secondary | ICD-10-CM

## 2022-04-27 ENCOUNTER — Telehealth: Payer: Medicare Other

## 2022-05-03 ENCOUNTER — Other Ambulatory Visit: Payer: Self-pay | Admitting: Family Medicine

## 2022-05-03 DIAGNOSIS — E785 Hyperlipidemia, unspecified: Secondary | ICD-10-CM

## 2022-05-05 DIAGNOSIS — D2261 Melanocytic nevi of right upper limb, including shoulder: Secondary | ICD-10-CM | POA: Diagnosis not present

## 2022-05-05 DIAGNOSIS — D1801 Hemangioma of skin and subcutaneous tissue: Secondary | ICD-10-CM | POA: Diagnosis not present

## 2022-05-05 DIAGNOSIS — D225 Melanocytic nevi of trunk: Secondary | ICD-10-CM | POA: Diagnosis not present

## 2022-05-05 DIAGNOSIS — L720 Epidermal cyst: Secondary | ICD-10-CM | POA: Diagnosis not present

## 2022-05-05 DIAGNOSIS — D692 Other nonthrombocytopenic purpura: Secondary | ICD-10-CM | POA: Diagnosis not present

## 2022-05-05 DIAGNOSIS — L814 Other melanin hyperpigmentation: Secondary | ICD-10-CM | POA: Diagnosis not present

## 2022-05-20 ENCOUNTER — Other Ambulatory Visit: Payer: Self-pay | Admitting: Family Medicine

## 2022-05-20 DIAGNOSIS — G894 Chronic pain syndrome: Secondary | ICD-10-CM

## 2022-05-20 DIAGNOSIS — M159 Polyosteoarthritis, unspecified: Secondary | ICD-10-CM

## 2022-05-21 NOTE — Telephone Encounter (Signed)
-   last filled 04/26/22 - last office visit 12/07/21 - next office visit is not scheduled - med contract needs to be updated

## 2022-05-25 ENCOUNTER — Other Ambulatory Visit: Payer: Self-pay | Admitting: Family Medicine

## 2022-05-25 DIAGNOSIS — Z1231 Encounter for screening mammogram for malignant neoplasm of breast: Secondary | ICD-10-CM

## 2022-06-14 ENCOUNTER — Ambulatory Visit
Admission: RE | Admit: 2022-06-14 | Discharge: 2022-06-14 | Disposition: A | Discharge status: Home / Self Care | Payer: Medicare Other | Source: Ambulatory Visit | Attending: Family Medicine | Admitting: Family Medicine

## 2022-06-14 DIAGNOSIS — Z1231 Encounter for screening mammogram for malignant neoplasm of breast: Secondary | ICD-10-CM

## 2022-06-25 ENCOUNTER — Other Ambulatory Visit: Payer: Self-pay | Admitting: Family Medicine

## 2022-06-25 DIAGNOSIS — G894 Chronic pain syndrome: Secondary | ICD-10-CM

## 2022-06-25 DIAGNOSIS — M159 Polyosteoarthritis, unspecified: Secondary | ICD-10-CM

## 2022-06-28 ENCOUNTER — Ambulatory Visit (INDEPENDENT_AMBULATORY_CARE_PROVIDER_SITE_OTHER): Payer: Medicare Other | Admitting: Family Medicine

## 2022-06-28 ENCOUNTER — Encounter: Payer: Self-pay | Admitting: Family Medicine

## 2022-06-28 VITALS — BP 124/60 | HR 85 | Temp 98.3°F | Ht 59.5 in | Wt 137.1 lb

## 2022-06-28 DIAGNOSIS — R2689 Other abnormalities of gait and mobility: Secondary | ICD-10-CM | POA: Diagnosis not present

## 2022-06-28 DIAGNOSIS — M419 Scoliosis, unspecified: Secondary | ICD-10-CM

## 2022-06-28 DIAGNOSIS — R296 Repeated falls: Secondary | ICD-10-CM

## 2022-06-28 NOTE — Progress Notes (Signed)
Established Patient Office Visit  Subjective   Patient ID: Tabitha Adams, female    DOB: 06-20-1946  Age: 76 y.o. MRN: [id number]  Chief Complaint  Patient presents with   Referral    Patient request referral to Neurology     HPI   Patient seen with concern for balance problems for at least past couple years.  She states she had several falls -possibly as many as 10 within the past year.  She had a fall where she fell and hit her head in June which prompted ER visit.  She had CT head and neck which showed some arthritis changes in the neck and chronic microvascular changes in the brain but no acute findings.  She does have history of some chronic back issues with scoliosis and it sounds like lumbar stenosis.  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  She does apparently take low-dose meclizine daily and also takes tramadol twice daily for pain.  With her falls she states that frequently her foot "catches "the ground.  She does not have any history of foot drop.  She has some chronic upper extremity tremor which she attributes to hereditary tremor.  Her mother had a tremor.  Minimal head and neck involvement.  No recent visual changes.  No focal weakness.  Denies any neuropathy symptoms.  She does ambulate occasionally with a cane.    She had some therapy for her back several months ago but none currently.  She states her granddaughter has trained her with doing some Pilates.  Past Medical History:  Diagnosis Date   Allergy    Anemia    Arthritis    GERD (gastroesophageal reflux disease)    Hyperlipidemia    Past Surgical History:  Procedure Laterality Date   APPENDECTOMY     BACK SURGERY     BREAST CYST ASPIRATION Left    BREAST EXCISIONAL BIOPSY Left    BREAST EXCISIONAL BIOPSY Left    CATARACT EXTRACTION, BILATERAL     ORIF WRIST FRACTURE Right 01/25/2017   Procedure: OPEN REDUCTION INTERNAL FIXATION (ORIF) WRIST FRACTURE;  Surgeon: Roseanne Kaufman, MD;   Location: [facility];  Service: Orthopedics;  Laterality: Right;   TOTAL ABDOMINAL HYSTERECTOMY     heavy periods    reports that she has quit smoking. She has never used smokeless tobacco. She reports that she does not drink alcohol and does not use drugs. family history includes Arthritis in her sister; Dementia in her father; Epilepsy in her son; Heart Problems in her son; Hypercholesterolemia in her sister; Hypertension in her father; Other in her brother, mother, and son; Stroke in her father; Tremor in her son. Allergies  Allergen Reactions   Cymbalta [Duloxetine Hcl] Nausea Only    Adverse reaction caused dizziness and nausea   Elemental Sulfur Swelling and Other (See Comments)    Reaction:  All over body swelling    Penicillins Rash and Other (See Comments)    Has patient had a PCN reaction causing immediate rash, facial/tongue/throat swelling, SOB or lightheadedness with hypotension: No Has patient had a PCN reaction causing severe rash involving mucus membranes or skin necrosis: No Has patient had a PCN reaction that required hospitalization No Has patient had a PCN reaction occurring within the last 10 years: No If all of the above answers are "NO", then may proceed with Cephalosporin use.    Review of Systems  Constitutional:  Negative for chills and fever.  Eyes:  Negative for blurred  vision.  Respiratory:  Negative for shortness of breath.   Cardiovascular:  Negative for chest pain.  Musculoskeletal:  Positive for back pain.  Neurological:  Positive for tremors. Negative for dizziness, seizures, loss of consciousness and headaches.      Objective:     BP 124/60 (BP Location: Left Arm, Patient Position: Sitting, Cuff Size: Normal)   Pulse 85   Temp 98.3 F (36.8 C) (Oral)   Ht 4' 11.5" (1.511 m)   Wt 137 lb 1.6 oz (62.2 kg)   SpO2 96%   BMI 27.23 kg/m  BP Readings from Last 3 Encounters:  06/28/22 124/60  04/19/22 (!) 141/71  12/07/21 (!) 148/80   Wt Readings  from Last 3 Encounters:  06/28/22 137 lb 1.6 oz (62.2 kg)  04/19/22 137 lb (62.1 kg)  12/07/21 138 lb (62.6 kg)      Physical Exam Constitutional:      Appearance: She is well-developed.  Eyes:     Pupils: Pupils are equal, round, and reactive to light.  Neck:     Thyroid: No thyromegaly.     Vascular: No JVD.  Cardiovascular:     Rate and Rhythm: Normal rate and regular rhythm.     Heart sounds:     No gallop.  Pulmonary:     Effort: Pulmonary effort is normal. No respiratory distress.     Breath sounds: Normal breath sounds. No wheezing or rales.  Musculoskeletal:     Cervical back: Neck supple.  Neurological:     General: No focal deficit present.     Mental Status: She is alert.     Comments: Generally weak in both lower extremities especially with knee extension and hip flexion but no focal weakness.  2+ reflex knee and ankle bilaterally  Normal monofilament testing  Timed  get up and go test was 11 seconds      No results found for any visits on 06/28/22.    The 10-year ASCVD risk score (Arnett DK, et al., 2019) is: 21.5%    Assessment & Plan:   Patient has history of balance issues with frequent falls.  Suspect multifactorial.  She has some generalized weakness especially lower extremities and also has significant scoliosis which is likely contributing to risk.  Also takes meclizine and tramadol daily.  No benzodiazepine use.  -Discontinue meclizine -Recommend setting up physical therapy for fall prevention/balance training/ fall risk reduction -Continue core strengthening exercises that her granddaughter is working with her own  No follow-ups on file.    Carolann Littler, MD

## 2022-06-28 NOTE — Patient Instructions (Signed)
Try to avoid taking the Meclizine as much as possible.

## 2022-07-11 ENCOUNTER — Other Ambulatory Visit: Payer: Self-pay | Admitting: Family Medicine

## 2022-07-11 DIAGNOSIS — F330 Major depressive disorder, recurrent, mild: Secondary | ICD-10-CM

## 2022-07-13 ENCOUNTER — Encounter: Payer: Self-pay | Admitting: Physical Therapy

## 2022-07-13 ENCOUNTER — Ambulatory Visit: Payer: Medicare Other | Attending: Family Medicine | Admitting: Physical Therapy

## 2022-07-13 ENCOUNTER — Other Ambulatory Visit: Payer: Self-pay

## 2022-07-13 DIAGNOSIS — R2681 Unsteadiness on feet: Secondary | ICD-10-CM | POA: Insufficient documentation

## 2022-07-13 DIAGNOSIS — R293 Abnormal posture: Secondary | ICD-10-CM | POA: Diagnosis not present

## 2022-07-13 DIAGNOSIS — R2689 Other abnormalities of gait and mobility: Secondary | ICD-10-CM | POA: Insufficient documentation

## 2022-07-13 DIAGNOSIS — M6281 Muscle weakness (generalized): Secondary | ICD-10-CM | POA: Diagnosis not present

## 2022-07-13 DIAGNOSIS — R296 Repeated falls: Secondary | ICD-10-CM | POA: Insufficient documentation

## 2022-07-13 NOTE — Therapy (Signed)
OUTPATIENT PHYSICAL THERAPY NEURO EVALUATION     Patient Name: Tabitha Adams MRN: [id number] DOB:June 19, 1946, 76 y.o., female Today's Date: 07/13/2022  PCP: [name], [name], MD REFERRING PROVIDER: Eulas Post, MD    PT End of Session - 07/13/22 1410     Visit Number 1    Number of Visits 13    Date for PT Re-Evaluation 08/24/22    Authorization Type [employer] Medicare 2023    PT Start Time 1326   pt late   PT Stop Time 1405    PT Time Calculation (min) 39 min    Equipment Utilized During Treatment Gait belt    Activity Tolerance Patient tolerated treatment well    Behavior During Therapy WFL for tasks assessed/performed             Past Medical History:  Diagnosis Date   Allergy    Anemia    Arthritis    GERD (gastroesophageal reflux disease)    Hyperlipidemia    Past Surgical History:  Procedure Laterality Date   APPENDECTOMY     BACK SURGERY     BREAST CYST ASPIRATION Left    BREAST EXCISIONAL BIOPSY Left    BREAST EXCISIONAL BIOPSY Left    CATARACT EXTRACTION, BILATERAL     ORIF WRIST FRACTURE Right 01/25/2017   Procedure: OPEN REDUCTION INTERNAL FIXATION (ORIF) WRIST FRACTURE;  Surgeon: Roseanne Kaufman, MD;  Location: [facility];  Service: Orthopedics;  Laterality: Right;   TOTAL ABDOMINAL HYSTERECTOMY     heavy periods   Patient Active Problem List   Diagnosis Date Noted   Scoliosis 06/28/2022   Prediabetes 08/28/2021   Vaginal vault prolapse 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 03/03/2020   Cystocele, midline 03/03/2020   Benign essential tremor 07/24/2018   Mild recurrent major depression ([place]) 03/22/2018   Benign paroxysmal positional vertigo due to bilateral vestibular disorder 09/27/2017   Barton's fracture of right radius 01/25/2017   Essential hypertension, benign 10/04/2016   Generalized osteoarthritis of multiple sites 08/31/2016   Chronic back pain 07/13/2016   Hyperlipidemia 07/13/2016   Chronic allergic rhinitis 07/13/2016   GERD (gastroesophageal  reflux disease) 07/13/2016   Chronic pain disorder 07/13/2016    ONSET DATE: past 2 months  REFERRING DIAG: Burchette, Alinda Sierras, MD  THERAPY DIAG:  Unsteadiness on feet  Other abnormalities of gait and mobility  Muscle weakness (generalized)  Abnormal posture  Rationale for Evaluation and Treatment Rehabilitation  SUBJECTIVE:   SUBJECTIVE STATEMENT: Patient reports increasing imbalance for the past few months. Has been working with her granddaughter on balance and piliates. Using walking pole for long distances for the past 2 months. Previous falls have occurred from her R foot getting caught on the floor; has had falls in forward and backward directions. Has been able to get up independently from most of her falls. MD recently advised her to only take Meclizine if she needs it for vertigo. In the past she has taken it in the AM d/t being afraid that she would get dizzy and fall at work but since she has stopped taking it she has not had any dizziness.   Pt accompanied by: self  PERTINENT HISTORY: anemia, GERD, HLD, back surgery, R wrist ORIF   PAIN:  Are you having pain? Yes: NPRS scale: 6/10 Pain location: LB  Pain description: aching Aggravating factors: standing Relieving factors: back brace  PRECAUTIONS: Fall  WEIGHT BEARING RESTRICTIONS No  FALLS: Has patient fallen in last 6 months? Yes. Number of falls 6  LIVING ENVIRONMENT:  Lives with: lives alone Lives in: House/apartment Stairs:  4 steps in and out with handrail; couple steps to get out of living room Has following equipment at home: Single point cane, Environmental consultant - 2 wheeled, Wheelchair (manual), Shower bench, Grab bars, and walking pole  PLOF: Independent; works part time at [employer] "get to where my balance is better"  OBJECTIVE:   DIAGNOSTIC FINDINGS: none recent  COGNITION: Overall cognitive status: Within functional limits for tasks assessed   SENSATION: WFL  POSTURE: rounded  shoulders, forward head, increased thoracic kyphosis, and thoracic R concave scoliotic curve with R shoulder depressed  AROM:  R ankle dorsiflexion: 7 deg L ankle dorsiflexion: 0 deg  LOWER EXTREMITY MMT:   MMT (in sitting) Right eval Left eval  Hip flexion 5 4  Hip abduction 4- 4-  Hip adduction 4+ 4+  Hip internal rotation    Hip external rotation    Knee flexion 5 4  Knee extension 5 5  Ankle dorsiflexion 4+ 4+  Ankle plantarflexion 4 4  Ankle inversion    Ankle eversion    (Blank rows = not tested)  GAIT: Gait pattern:  narrow BOS with hip instability and decreased R step length; trunk flexed   Assistive device utilized: None Level of assistance: Modified independence   FUNCTIONAL TESTs:   [facility] PT Assessment - 07/13/22 0001       Standardized Balance Assessment   Standardized Balance Assessment Berg Balance Test;Timed Up and Go Test;Five Times Sit to Stand    Five times sit to stand comments  16.91 sec pushing off knees; 16.38 sec without UEs- posterior LOB into chair on rep 4      Berg Balance Test   Sit to Stand Able to stand without using hands and stabilize independently    Standing Unsupported Able to stand safely 2 minutes    Sitting with Back Unsupported but Feet Supported on Floor or Stool Able to sit safely and securely 2 minutes    Stand to Sit Sits safely with minimal use of hands    Transfers Able to transfer safely, minor use of hands    Standing Unsupported with Eyes Closed Able to stand 10 seconds safely    Standing Unsupported with Feet Together Able to place feet together independently but unable to hold for 30 seconds    From Standing, Reach Forward with Outstretched Arm Can reach forward >12 cm safely (5")    From Standing Position, Pick up Object from Floor Able to pick up shoe safely and easily    From Standing Position, Turn to Look Behind Over each Shoulder Looks behind one side only/other side shows less weight shift    Turn 360 Degrees  Able to turn 360 degrees safely but slowly   c/o dizziness   Standing Unsupported, Alternately Place Feet on Step/Stool Able to complete 4 steps without aid or supervision   completed 8 steps with CGA-min A   Standing Unsupported, One Foot in Front Able to plae foot ahead of the other independently and hold 30 seconds    Standing on One Leg Able to lift leg independently and hold > 10 seconds    Total Score 47      Timed Up and Go Test   Normal TUG (seconds) 15.43   without AD; LOB during turn           HEP:  Access Code: [email or web address] URL: [email or web address] Date: 07/13/2022 Prepared by: [facility] - Outpatient  Rehab -  [facility]  Exercises - Sit to Stand Without Arm Support  - 1 x daily - 5 x weekly - 2 sets - 10 reps - Standing Hip Abduction with Counter Support  - 1 x daily - 5 x weekly - 2 sets - 10 reps - Heel Raises with Counter Support  - 1 x daily - 5 x weekly - 2 sets - 10 reps - Gastroc Stretch on Wall  - 1 x daily - 5 x weekly - 2 sets - 30 sec hold - Forward Backward Weight Shift with Counter Support  - 1 x daily - 5 x weekly - 2 sets - 10 reps   PATIENT EDUCATION: Education details: prognosis, POC, HEP Person educated: Patient Education method: Explanation, Demonstration, Tactile cues, Verbal cues, and Handouts Education comprehension: verbalized understanding and returned demonstration   GOALS: Goals reviewed with patient? Yes  SHORT TERM GOALS: Target date: 08/03/2022  Patient to be independent with initial HEP. Baseline: HEP initiated Goal status: INITIAL    LONG TERM GOALS: Target date: 08/24/2022  Patient to be independent with advanced HEP. Baseline: Not yet initiated  Goal status: INITIAL  Patient to demonstrate B LE strength >/=4+/5.  Baseline: See above Goal status: INITIAL  Patient to demonstrate B ankle dorsiflexion AROM to 10 degrees in order to improve safety with ambulation.  Baseline: 0 and 7 deg Goal status:  INITIAL  Patient to complete TUG in <14 sec with LRAD in order to decrease risk of falls.   Baseline: 15.43 sec Goal status: INITIAL  Patient to demonstrate 5xSTS test in <15 sec in order to decrease risk of falls.  Baseline: 16.38 sec Goal status: INITIAL  Patient to score at least 50/56 on Berg in order to decrease risk of falls.  Baseline: 47 Goal status: INITIAL   ASSESSMENT:  CLINICAL IMPRESSION:  Patient is a 76 y/o F presenting to OPPT with c/o imbalance and falls for the past few months. Reports a hx of vertigo but notes this has not occurred recently. Using a walking pole to ambulate for long distances. Patient continues to work part time and reports that she has had falls at work in the past. Patient today presenting with rounded and curved posture, decreased L>R LE strength, decreased L ankle dorsiflexion AROM, gait deviations, increased time required for gait and transfers, and imbalance. Patient was educated on gentle stretching and strengthening HEP and reported understanding. Would benefit from skilled PT services 2 x/week for 6 weeks to address aforementioned impairments in order to optimize level of function.    OBJECTIVE IMPAIRMENTS Abnormal gait, decreased balance, decreased ROM, decreased strength, dizziness, increased muscle spasms, improper body mechanics, postural dysfunction, and pain.   ACTIVITY LIMITATIONS carrying, lifting, bending, standing, stairs, transfers, bathing, and dressing  PARTICIPATION LIMITATIONS: meal prep, cleaning, laundry, driving, shopping, community activity, occupation, yard work, and church  PERSONAL FACTORS Age, Past/current experiences, Profession, Time since onset of injury/illness/exacerbation, and 3+ comorbidities: anemia, GERD, HLD, back surgery, R wrist ORIF  are also affecting patient's functional outcome.   REHAB POTENTIAL: Good  CLINICAL DECISION MAKING: Evolving/moderate complexity  EVALUATION COMPLEXITY:  Moderate   PLAN: PT FREQUENCY: 2x/week  PT DURATION: 6 weeks  PLANNED INTERVENTIONS: Therapeutic exercises, Therapeutic activity, Neuromuscular re-education, Balance training, Gait training, Patient/Family education, Self Care, Joint mobilization, Stair training, Vestibular training, Canalith repositioning, Aquatic Therapy, Dry Needling, Electrical stimulation, Cryotherapy, Moist heat, Taping, Manual therapy, and Re-evaluation  PLAN FOR NEXT SESSION: reassess HEP; work on weight shifting activities and posterior  stepping to address posterior LOB, turns   Janene Harvey, PT, DPT 07/13/22 2:21 PM  [facility] at [facility] [address], [address], [place] [address] Phone # [phone] Fax # [phone]

## 2022-07-15 ENCOUNTER — Encounter: Payer: Self-pay | Admitting: Physical Therapy

## 2022-07-15 ENCOUNTER — Ambulatory Visit: Payer: Medicare Other | Admitting: Physical Therapy

## 2022-07-15 DIAGNOSIS — R2689 Other abnormalities of gait and mobility: Secondary | ICD-10-CM

## 2022-07-15 DIAGNOSIS — R296 Repeated falls: Secondary | ICD-10-CM | POA: Diagnosis not present

## 2022-07-15 DIAGNOSIS — R2681 Unsteadiness on feet: Secondary | ICD-10-CM

## 2022-07-15 DIAGNOSIS — R293 Abnormal posture: Secondary | ICD-10-CM

## 2022-07-15 DIAGNOSIS — M6281 Muscle weakness (generalized): Secondary | ICD-10-CM | POA: Diagnosis not present

## 2022-07-15 NOTE — Therapy (Signed)
OUTPATIENT PHYSICAL THERAPY NEURO TREATMENT     Patient Name: Tabitha Adams MRN: [id number] DOB:[birth year], 76 y.o., female Today's Date: 07/15/2022  PCP: [name], [name], MD REFERRING PROVIDER: Eulas Post, MD    PT End of Session - 07/15/22 1613     Visit Number 2    Number of Visits 13    Date for PT Re-Evaluation 08/24/22    Authorization Type U[employer] Medicare 2023    PT Start Time 7253    PT Stop Time 1612    PT Time Calculation (min) 41 min    Equipment Utilized During Treatment Gait belt    Activity Tolerance Patient tolerated treatment well    Behavior During Therapy WFL for tasks assessed/performed              Past Medical History:  Diagnosis Date   Allergy    Anemia    Arthritis    GERD (gastroesophageal reflux disease)    Hyperlipidemia    Past Surgical History:  Procedure Laterality Date   APPENDECTOMY     BACK SURGERY     BREAST CYST ASPIRATION Left    BREAST EXCISIONAL BIOPSY Left    BREAST EXCISIONAL BIOPSY Left    CATARACT EXTRACTION, BILATERAL     ORIF WRIST FRACTURE Right 01/25/2017   Procedure: OPEN REDUCTION INTERNAL FIXATION (ORIF) WRIST FRACTURE;  Surgeon: Roseanne Kaufman, MD;  Location: [facility];  Service: Orthopedics;  Laterality: Right;   TOTAL ABDOMINAL HYSTERECTOMY     heavy periods   Patient Active Problem List   Diagnosis Date Noted   Scoliosis 06/28/2022   Prediabetes 08/28/2021   Vaginal vault prolapse 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 03/03/2020   Cystocele, midline 03/03/2020   Benign essential tremor 07/24/2018   Mild recurrent major depression ([place]) 03/22/2018   Benign paroxysmal positional vertigo due to bilateral vestibular disorder 09/27/2017   Barton's fracture of right radius 01/25/2017   Essential hypertension, benign 10/04/2016   Generalized osteoarthritis of multiple sites 08/31/2016   Chronic back pain 07/13/2016   Hyperlipidemia 07/13/2016   Chronic allergic rhinitis 07/13/2016   GERD (gastroesophageal reflux  disease) 07/13/2016   Chronic pain disorder 07/13/2016    ONSET DATE: past 2 months  REFERRING DIAG: Burchette, Alinda Sierras, MD  THERAPY DIAG:  Unsteadiness on feet  Other abnormalities of gait and mobility  Muscle weakness (generalized)  Abnormal posture  Rationale for Evaluation and Treatment Rehabilitation  SUBJECTIVE:   SUBJECTIVE STATEMENT: Fell at work yesterday- got bumped into and notes that she has a bruise on her bottom and some pain in her back. [place] really hard on her bottom and slammed her back into the bumper of a car. Denies N/T or weakness.   Pt accompanied by: self  PERTINENT HISTORY: anemia, GERD, HLD, back surgery, R wrist ORIF   PAIN:  Are you having pain? Yes: NPRS scale: 6/10 Pain location: LB  Pain description: aching Aggravating factors: standing Relieving factors: back brace  PRECAUTIONS: Fall  PATIENT GOALS "get to where my balance is better"  OBJECTIVE:      TODAY'S TREATMENT: 07/15/22 Activity Comments  Gait training with walking pole in L hand 230f Cues for proper AD sequencing and looking ahead  STS no Ues 10x Cues for proper set up to avoid posterior LOB   Standing hip ABD with chair uspport 10 Cues to avoid hip ER  heel raise with chair uspport 10 Good form  gastroc stretch 30" each  Assistance for proper foot position   fwd/back wt shift  10x  Limited forward translation d/t flexed trunk at rest   staggered ant/pos wt shift  With/without UE support; good coordination   alt posterior steps Without UE support; mild imbalance   backwards walk in II bars  Heavy cueing to widen BOS  gait + figure 8 turns with single walking pole More difficulty with L hand turns, thus tried 360 deg turns to the L; verbal/manual cueing to keep pole moving continuously         [place] Last updated: 07/15/22 Access Code: [email or web address] URL: [email or web address] Date: 07/15/2022 Prepared by: [facility]  Exercises - Sit to Stand Without Arm Support  - 1 x daily - 5 x weekly - 2 sets - 10 reps - Standing Hip Abduction with Counter Support  - 1 x daily - 5 x weekly - 2 sets - 10 reps - Heel Raises with Counter Support  - 1 x daily - 5 x weekly - 2 sets - 10 reps - Gastroc Stretch on Wall  - 1 x daily - 5 x weekly - 2 sets - 30 sec hold - Forward Backward Weight Shift with Counter Support  - 1 x daily - 5 x weekly - 2 sets - 10 reps - Staggered Stance Forward Backward Weight Shift with Unilateral Counter Support  - 1 x daily - 5 x weekly - 2 sets - 10 reps   PATIENT EDUCATION: Education details: discouraged patient from practicing walking pattern with pole in L hand d/t instability; HEP updates Person educated: Patient Education method: Explanation, Demonstration, Tactile cues, Verbal cues, and Handouts Education comprehension: verbalized understanding and returned demonstration    Below measures were taken at time of initial evaluation unless otherwise specified:   DIAGNOSTIC FINDINGS: none recent  COGNITION: Overall cognitive status: Within functional limits for tasks assessed   SENSATION: WFL  POSTURE: rounded shoulders, forward head, increased thoracic kyphosis, and thoracic R concave scoliotic curve with R shoulder depressed  AROM:  R ankle dorsiflexion: 7 deg L ankle dorsiflexion: 0 deg  LOWER EXTREMITY MMT:   MMT (in sitting) Right eval Left eval  Hip flexion 5 4  Hip abduction 4- 4-  Hip adduction 4+ 4+  Hip internal rotation    Hip external rotation    Knee flexion 5 4  Knee extension 5 5  Ankle dorsiflexion 4+ 4+  Ankle plantarflexion 4 4  Ankle inversion    Ankle eversion    (Blank rows = not tested)  GAIT: Gait pattern:  narrow BOS with hip instability and decreased R step length; trunk flexed   Assistive device utilized: None Level of assistance: Modified independence   FUNCTIONAL TESTs:  Berg:  47/56     HEP:  Access Code: [email or web address] URL: [email or web address] Date: 07/13/2022 Prepared by: [facility]  Exercises - Sit to Stand Without Arm Support  - 1 x daily - 5 x weekly - 2 sets - 10 reps - Standing Hip Abduction with Counter Support  - 1 x daily - 5 x weekly - 2 sets - 10 reps - Heel Raises with Counter Support  - 1 x daily - 5 x weekly - 2 sets - 10 reps - Gastroc Stretch on Wall  - 1 x daily - 5 x weekly - 2 sets - 30 sec hold - Forward Backward Weight Shift with Counter Support  - 1 x daily - 5 x weekly - 2 sets -  10 reps   PATIENT EDUCATION: Education details: prognosis, POC, HEP Person educated: Patient Education method: Explanation, Demonstration, Tactile cues, Verbal cues, and Handouts Education comprehension: verbalized understanding and returned demonstration   GOALS: Goals reviewed with patient? Yes  SHORT TERM GOALS: Target date: 08/03/2022  Patient to be independent with initial HEP. Baseline: HEP initiated Goal status: IN PROGRESS    LONG TERM GOALS: Target date: 08/24/2022  Patient to be independent with advanced HEP. Baseline: Not yet initiated  Goal status: IN PROGRESS  Patient to demonstrate B LE strength >/=4+/5.  Baseline: See above Goal status: IN PROGRESS  Patient to demonstrate B ankle dorsiflexion AROM to 10 degrees in order to improve safety with ambulation.  Baseline: 0 and 7 deg Goal status: IN PROGRESS  Patient to complete TUG in <14 sec with LRAD in order to decrease risk of falls.   Baseline: 15.43 sec Goal status: IN PROGRESS  Patient to demonstrate 5xSTS test in <15 sec in order to decrease risk of falls.  Baseline: 16.38 sec Goal status: IN PROGRESS  Patient to score at least 50/56 on Berg in order to decrease risk of falls.  Baseline: 47 Goal status: IN PROGRESS   ASSESSMENT:  CLINICAL IMPRESSION: Patient arrived to session with report of a fall at work  when she was knocked down. Reports that she fell on her buttocks and hit her back on the bumper of a car. Denies N/T or weakness or other injuries. Worked on gait training with walking pole in L hand to assist with more upright posture d/t direction of patient's scoliotic curve. Patient required heavy cueing and assist for proper sequencing. Reviewed HEP with intermittent cues for form. Patient performed backwards stepping and weight shifting activities with mild-moderate imbalance. Instructed patient not to practice gait training performed today at home d/t safety. Patient reported understanding of all edu provided and without complaints at end of session.     OBJECTIVE IMPAIRMENTS Abnormal gait, decreased balance, decreased ROM, decreased strength, dizziness, increased muscle spasms, improper body mechanics, postural dysfunction, and pain.   ACTIVITY LIMITATIONS carrying, lifting, bending, standing, stairs, transfers, bathing, and dressing  PARTICIPATION LIMITATIONS: meal prep, cleaning, laundry, driving, shopping, community activity, occupation, yard work, and church  PERSONAL FACTORS Age, Past/current experiences, Profession, Time since onset of injury/illness/exacerbation, and 3+ comorbidities: anemia, GERD, HLD, back surgery, R wrist ORIF  are also affecting patient's functional outcome.   REHAB POTENTIAL: Good  CLINICAL DECISION MAKING: Evolving/moderate complexity  EVALUATION COMPLEXITY: Moderate   PLAN: PT FREQUENCY: 2x/week  PT DURATION: 6 weeks  PLANNED INTERVENTIONS: Therapeutic exercises, Therapeutic activity, Neuromuscular re-education, Balance training, Gait training, Patient/Family education, Self Care, Joint mobilization, Stair training, Vestibular training, Canalith repositioning, Aquatic Therapy, Dry Needling, Electrical stimulation, Cryotherapy, Moist heat, Taping, Manual therapy, and Re-evaluation  PLAN FOR NEXT SESSION: reassess HEP; work on weight shifting activities  and posterior stepping to address posterior LOB, turns   Janene Harvey, PT, DPT 07/15/22 4:13 PM  [facility] at [facility] [address] [place], [place] [address] Phone # [phone] Fax # [phone]

## 2022-07-20 ENCOUNTER — Ambulatory Visit: Payer: Medicare Other | Attending: Family Medicine

## 2022-07-20 DIAGNOSIS — R293 Abnormal posture: Secondary | ICD-10-CM

## 2022-07-20 DIAGNOSIS — R2689 Other abnormalities of gait and mobility: Secondary | ICD-10-CM | POA: Diagnosis not present

## 2022-07-20 DIAGNOSIS — R2681 Unsteadiness on feet: Secondary | ICD-10-CM | POA: Diagnosis not present

## 2022-07-20 DIAGNOSIS — M6281 Muscle weakness (generalized): Secondary | ICD-10-CM

## 2022-07-20 NOTE — Therapy (Signed)
OUTPATIENT PHYSICAL THERAPY NEURO TREATMENT     Patient Name: Tabitha Adams MRN: [id number] DOB:[birth year], 76 y.o., female Today's Date: 07/20/2022  PCP: [name], [name], MD REFERRING PROVIDER: Eulas Post, MD    PT End of Session - 07/20/22 1530     Visit Number 3    Number of Visits 13    Date for PT Re-Evaluation 08/24/22    Authorization Type [employer] Medicare 2023    PT Start Time 9518    PT Stop Time 8416    PT Time Calculation (min) 45 min    Equipment Utilized During Treatment Gait belt    Activity Tolerance Patient tolerated treatment well    Behavior During Therapy WFL for tasks assessed/performed              Past Medical History:  Diagnosis Date   Allergy    Anemia    Arthritis    GERD (gastroesophageal reflux disease)    Hyperlipidemia    Past Surgical History:  Procedure Laterality Date   APPENDECTOMY     BACK SURGERY     BREAST CYST ASPIRATION Left    BREAST EXCISIONAL BIOPSY Left    BREAST EXCISIONAL BIOPSY Left    CATARACT EXTRACTION, BILATERAL     ORIF WRIST FRACTURE Right 01/25/2017   Procedure: OPEN REDUCTION INTERNAL FIXATION (ORIF) WRIST FRACTURE;  Surgeon: Roseanne Kaufman, MD;  Location: [facility];  Service: Orthopedics;  Laterality: Right;   TOTAL ABDOMINAL HYSTERECTOMY     heavy periods   Patient Active Problem List   Diagnosis Date Noted   Scoliosis 06/28/2022   Prediabetes 08/28/2021   Vaginal vault prolapse 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 03/03/2020   Cystocele, midline 03/03/2020   Benign essential tremor 07/24/2018   Mild recurrent major depression ([place]) 03/22/2018   Benign paroxysmal positional vertigo due to bilateral vestibular disorder 09/27/2017   Barton's fracture of right radius 01/25/2017   Essential hypertension, benign 10/04/2016   Generalized osteoarthritis of multiple sites 08/31/2016   Chronic back pain 07/13/2016   Hyperlipidemia 07/13/2016   Chronic allergic rhinitis 07/13/2016   GERD (gastroesophageal reflux  disease) 07/13/2016   Chronic pain disorder 07/13/2016    ONSET DATE: past 2 months  REFERRING DIAG: Burchette, Alinda Sierras, MD  THERAPY DIAG:  Unsteadiness on feet  Other abnormalities of gait and mobility  Muscle weakness (generalized)  Abnormal posture  Rationale for Evaluation and Treatment Rehabilitation  SUBJECTIVE:   SUBJECTIVE STATEMENT: Feeling sore from the fall at work in the right shoulder and back when performing certain movements  Pt accompanied by: self  PERTINENT HISTORY: anemia, GERD, HLD, back surgery, R wrist ORIF   PAIN:  Are you having pain? Yes: NPRS scale: 4/10 Pain location: LB, right ribs  Pain description: aching Aggravating factors: standing Relieving factors: back brace  PRECAUTIONS: Fall  PATIENT GOALS "get to where my balance is better"  OBJECTIVE:    TODAY'S TREATMENT: 07/20/22 Activity Comments  HEP review -sit to stand: 2x5 -hip abd 2x10 -calf raise 2x10 -calf stretch 2x30 sec -forward/backward weight shift 10x -staggered stance forward/backward weight shift 10x  Forward/backward stepping Over trekking pole 10x  Step lift-offs  2x10 4" box,   Obstacle course -stepping up/down foam pad, over 6" hurdles--forward and lateral -slalom around 3 obstacles x 2 rounds -marching on foam 2x30 sec -cone taps 2x10            TODAY'S TREATMENT: 07/15/22 Activity Comments  Gait training with walking pole in L hand 263f Cues for proper  AD sequencing and looking ahead  STS no Ues 10x Cues for proper set up to avoid posterior LOB   Standing hip ABD with chair uspport 10 Cues to avoid hip ER  heel raise with chair uspport 10 Good form  gastroc stretch 30" each  Assistance for proper foot position   fwd/back wt shift 10x  Limited forward translation d/t flexed trunk at rest   staggered ant/pos wt shift  With/without UE support; good coordination   alt posterior steps Without UE support; mild imbalance   backwards walk in II bars  Heavy  cueing to widen BOS  gait + figure 8 turns with single walking pole More difficulty with L hand turns, thus tried 360 deg turns to the L; verbal/manual cueing to keep pole moving continuously         [place] Last updated: 07/15/22 Access Code: [email or web address] URL: [email or web address] Date: 07/15/2022 Prepared by: [facility]  Exercises - Sit to Stand Without Arm Support  - 1 x daily - 5 x weekly - 2 sets - 10 reps - Standing Hip Abduction with Counter Support  - 1 x daily - 5 x weekly - 2 sets - 10 reps - Heel Raises with Counter Support  - 1 x daily - 5 x weekly - 2 sets - 10 reps - Gastroc Stretch on Wall  - 1 x daily - 5 x weekly - 2 sets - 30 sec hold - Forward Backward Weight Shift with Counter Support  - 1 x daily - 5 x weekly - 2 sets - 10 reps - Staggered Stance Forward Backward Weight Shift with Unilateral Counter Support  - 1 x daily - 5 x weekly - 2 sets - 10 reps   PATIENT EDUCATION: Education details: discouraged patient from practicing walking pattern with pole in L hand d/t instability; HEP updates Person educated: Patient Education method: Explanation, Demonstration, Tactile cues, Verbal cues, and Handouts Education comprehension: verbalized understanding and returned demonstration    Below measures were taken at time of initial evaluation unless otherwise specified:   DIAGNOSTIC FINDINGS: none recent  COGNITION: Overall cognitive status: Within functional limits for tasks assessed   SENSATION: WFL  POSTURE: rounded shoulders, forward head, increased thoracic kyphosis, and thoracic R concave scoliotic curve with R shoulder depressed  AROM:  R ankle dorsiflexion: 7 deg L ankle dorsiflexion: 0 deg  LOWER EXTREMITY MMT:   MMT (in sitting) Right eval Left eval  Hip flexion 5 4  Hip abduction 4- 4-  Hip adduction 4+ 4+  Hip internal rotation    Hip external rotation    Knee flexion 5 4   Knee extension 5 5  Ankle dorsiflexion 4+ 4+  Ankle plantarflexion 4 4  Ankle inversion    Ankle eversion    (Blank rows = not tested)  GAIT: Gait pattern:  narrow BOS with hip instability and decreased R step length; trunk flexed   Assistive device utilized: None Level of assistance: Modified independence   FUNCTIONAL TESTs:  Berg: 47/56     HEP:  Access Code: [email or web address] URL: [email or web address] Date: 07/13/2022 Prepared by: [facility]  Exercises - Sit to Stand Without Arm Support  - 1 x daily - 5 x weekly - 2 sets - 10 reps - Standing Hip Abduction with Counter Support  - 1 x daily - 5 x weekly - 2 sets - 10 reps - Heel Raises with Counter  Support  - 1 x daily - 5 x weekly - 2 sets - 10 reps - Gastroc Stretch on Wall  - 1 x daily - 5 x weekly - 2 sets - 30 sec hold - Forward Backward Weight Shift with Counter Support  - 1 x daily - 5 x weekly - 2 sets - 10 reps   PATIENT EDUCATION: Education details: prognosis, POC, HEP Person educated: Patient Education method: Explanation, Demonstration, Tactile cues, Verbal cues, and Handouts Education comprehension: verbalized understanding and returned demonstration   GOALS: Goals reviewed with patient? Yes  SHORT TERM GOALS: Target date: 08/03/2022  Patient to be independent with initial HEP. Baseline: HEP initiated Goal status: IN PROGRESS    LONG TERM GOALS: Target date: 08/24/2022  Patient to be independent with advanced HEP. Baseline: Not yet initiated  Goal status: IN PROGRESS  Patient to demonstrate B LE strength >/=4+/5.  Baseline: See above Goal status: IN PROGRESS  Patient to demonstrate B ankle dorsiflexion AROM to 10 degrees in order to improve safety with ambulation.  Baseline: 0 and 7 deg Goal status: IN PROGRESS  Patient to complete TUG in <14 sec with LRAD in order to decrease risk of falls.   Baseline: 15.43 sec Goal status: IN  PROGRESS  Patient to demonstrate 5xSTS test in <15 sec in order to decrease risk of falls.  Baseline: 16.38 sec Goal status: IN PROGRESS  Patient to score at least 50/56 on Berg in order to decrease risk of falls.  Baseline: 47 Goal status: IN PROGRESS   ASSESSMENT:  CLINICAL IMPRESSION: Good HEP recall of initial activities using visual/written materials with only 2-3 verbal cues in form/sequence correction.  Proceeded with additional stepping/balance activities to improve safety with obstacle negotiation and managing uneven surfaces using trekking pole.  Difficulty with single limb support during step lift off with several retro-LOB. Difficulty with stepping over 6" height barriers with tendency for trailing leg to circumduct. Continued sessions to progress POC details and reduce risk for falls.     OBJECTIVE IMPAIRMENTS Abnormal gait, decreased balance, decreased ROM, decreased strength, dizziness, increased muscle spasms, improper body mechanics, postural dysfunction, and pain.   ACTIVITY LIMITATIONS carrying, lifting, bending, standing, stairs, transfers, bathing, and dressing  PARTICIPATION LIMITATIONS: meal prep, cleaning, laundry, driving, shopping, community activity, occupation, yard work, and church  PERSONAL FACTORS Age, Past/current experiences, Profession, Time since onset of injury/illness/exacerbation, and 3+ comorbidities: anemia, GERD, HLD, back surgery, R wrist ORIF  are also affecting patient's functional outcome.   REHAB POTENTIAL: Good  CLINICAL DECISION MAKING: Evolving/moderate complexity  EVALUATION COMPLEXITY: Moderate   PLAN: PT FREQUENCY: 2x/week  PT DURATION: 6 weeks  PLANNED INTERVENTIONS: Therapeutic exercises, Therapeutic activity, Neuromuscular re-education, Balance training, Gait training, Patient/Family education, Self Care, Joint mobilization, Stair training, Vestibular training, Canalith repositioning, Aquatic Therapy, Dry Needling,  Electrical stimulation, Cryotherapy, Moist heat, Taping, Manual therapy, and Re-evaluation  PLAN FOR NEXT SESSION: work on weight shifting activities and posterior stepping to address posterior LOB, turns   4:16 PM, 07/20/22 M. Sherlyn Lees, PT, DPT Physical Therapist- [place] Office Number: [phone]   [place] [facility] [address] [place], [place] [address] Phone # [phone] Fax # [phone]

## 2022-07-21 ENCOUNTER — Other Ambulatory Visit: Payer: Self-pay | Admitting: Family Medicine

## 2022-07-21 DIAGNOSIS — G894 Chronic pain syndrome: Secondary | ICD-10-CM

## 2022-07-21 DIAGNOSIS — M159 Polyosteoarthritis, unspecified: Secondary | ICD-10-CM

## 2022-07-21 NOTE — Therapy (Signed)
OUTPATIENT PHYSICAL THERAPY NEURO TREATMENT     Patient Name: Tabitha Adams MRN: [id number] DOB:December 26, 1945, 76 y.o., female Today's Date: 07/22/2022  PCP: [name], [name], MD REFERRING PROVIDER: Eulas Post, MD    PT End of Session - 07/22/22 1615     Visit Number 4    Number of Visits 13    Date for PT Re-Evaluation 08/24/22    Authorization Type [employer] Medicare 2023    PT Start Time 7416    PT Stop Time 3845    PT Time Calculation (min) 39 min    Equipment Utilized During Treatment Gait belt    Activity Tolerance Patient tolerated treatment well    Behavior During Therapy WFL for tasks assessed/performed               Past Medical History:  Diagnosis Date   Allergy    Anemia    Arthritis    GERD (gastroesophageal reflux disease)    Hyperlipidemia    Past Surgical History:  Procedure Laterality Date   APPENDECTOMY     BACK SURGERY     BREAST CYST ASPIRATION Left    BREAST EXCISIONAL BIOPSY Left    BREAST EXCISIONAL BIOPSY Left    CATARACT EXTRACTION, BILATERAL     ORIF WRIST FRACTURE Right 01/25/2017   Procedure: OPEN REDUCTION INTERNAL FIXATION (ORIF) WRIST FRACTURE;  Surgeon: Roseanne Kaufman, MD;  Location: [facility];  Service: Orthopedics;  Laterality: Right;   TOTAL ABDOMINAL HYSTERECTOMY     heavy periods   Patient Active Problem List   Diagnosis Date Noted   Scoliosis 06/28/2022   Prediabetes 08/28/2021   Vaginal vault prolapse 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 03/03/2020   Cystocele, midline 03/03/2020   Benign essential tremor 07/24/2018   Mild recurrent major depression ([place]) 03/22/2018   Benign paroxysmal positional vertigo due to bilateral vestibular disorder 09/27/2017   Barton's fracture of right radius 01/25/2017   Essential hypertension, benign 10/04/2016   Generalized osteoarthritis of multiple sites 08/31/2016   Chronic back pain 07/13/2016   Hyperlipidemia 07/13/2016   Chronic allergic rhinitis 07/13/2016   GERD (gastroesophageal reflux  disease) 07/13/2016   Chronic pain disorder 07/13/2016    ONSET DATE: past 2 months  REFERRING DIAG: Burchette, Alinda Sierras, MD  THERAPY DIAG:  Unsteadiness on feet  Other abnormalities of gait and mobility  Muscle weakness (generalized)  Abnormal posture  Rationale for Evaluation and Treatment Rehabilitation  SUBJECTIVE:   SUBJECTIVE STATEMENT: Doing fine, nothing new. Brought in my walking poles.   Pt accompanied by: self  PERTINENT HISTORY: anemia, GERD, HLD, back surgery, R wrist ORIF   PAIN:  Are you having pain? Yes: NPRS scale: 4/10 Pain location: LB  Pain description: aching Aggravating factors: standing Relieving factors: back brace  PRECAUTIONS: Fall  PATIENT GOALS "get to where my balance is better"  OBJECTIVE:      TODAY'S TREATMENT: 07/22/22 Activity Comments  gait trianing with B walking poles 157f Cueing to increase step length and consistency of stepping with turns   Red TB row 2x10 Cues to retract scapulae   Red TB extension 2x10 Cues to maintain extended elbows  Lumbar lateral shift standing with R arm on wall 10x3" Cueing for positioning  Prayer stretch to L diagonal 10x3' Limited amplitude  Gait training with B walking poles on outside sidewalk x150 ft Slow speed but improved AD sequencing   STS with green medball 10x Cues to shift hips forward upon standing and shift weight anteriorly over toes to correct posterior  LOB  staggered ant/pos wt shift 15x each  Performed without UE support; cueing to increase anterior wt shift onto L LE    HOME EXERCISE PROGRAM Last updated: 07/22/22 Access Code: [email or web address] URL: [email or web address] Date: 07/22/2022 Prepared by: [facility]  Exercises - Sit to Stand Without Arm Support  - 1 x daily - 5 x weekly - 2 sets - 10 reps - Standing Hip Abduction with Counter Support  - 1 x daily - 5 x weekly - 2 sets - 10 reps - Heel Raises with Counter Support   - 1 x daily - 5 x weekly - 2 sets - 10 reps - Gastroc Stretch on Wall  - 1 x daily - 5 x weekly - 2 sets - 30 sec hold - Forward Backward Weight Shift with Counter Support  - 1 x daily - 5 x weekly - 2 sets - 10 reps - Staggered Stance Forward Backward Weight Shift with Unilateral Counter Support  - 1 x daily - 5 x weekly - 2 sets - 10 reps - Lateral Shift Correction at Wall  - 1 x daily - 5 x weekly - 2 sets - 10 reps - Seated Thoracic Flexion and Rotation with Swiss Ball  - 1 x daily - 5 x weekly - 2 sets - 10 reps - Standing Shoulder Row with Anchored Resistance  - 1 x daily - 5 x weekly - 2 sets - 10 reps - Shoulder extension with resistance - Neutral  - 1 x daily - 5 x weekly - 2 sets - 10 reps    PATIENT EDUCATION: Education details: encouraged continued practice ambulating with B walking poles indoors, HEP update Person educated: Patient Education method: Explanation, Demonstration, Tactile cues, Verbal cues, and Handouts Education comprehension: verbalized understanding and returned demonstration   Below measures were taken at time of initial evaluation unless otherwise specified:   DIAGNOSTIC FINDINGS: none recent  COGNITION: Overall cognitive status: Within functional limits for tasks assessed   SENSATION: WFL  POSTURE: rounded shoulders, forward head, increased thoracic kyphosis, and thoracic R concave scoliotic curve with R shoulder depressed  AROM:  R ankle dorsiflexion: 7 deg L ankle dorsiflexion: 0 deg  LOWER EXTREMITY MMT:   MMT (in sitting) Right eval Left eval  Hip flexion 5 4  Hip abduction 4- 4-  Hip adduction 4+ 4+  Hip internal rotation    Hip external rotation    Knee flexion 5 4  Knee extension 5 5  Ankle dorsiflexion 4+ 4+  Ankle plantarflexion 4 4  Ankle inversion    Ankle eversion    (Blank rows = not tested)  GAIT: Gait pattern:  narrow BOS with hip instability and decreased R step length; trunk flexed   Assistive device utilized:  None Level of assistance: Modified independence   FUNCTIONAL TESTs:  Berg: 47/56     HEP:  Access Code: [email or web address] URL: [email or web address] Date: 07/13/2022 Prepared by: [facility]  Exercises - Sit to Stand Without Arm Support  - 1 x daily - 5 x weekly - 2 sets - 10 reps - Standing Hip Abduction with Counter Support  - 1 x daily - 5 x weekly - 2 sets - 10 reps - Heel Raises with Counter Support  - 1 x daily - 5 x weekly - 2 sets - 10 reps - Gastroc Stretch on Wall  - 1 x daily - 5 x weekly - 2  sets - 30 sec hold - Forward Backward Weight Shift with Counter Support  - 1 x daily - 5 x weekly - 2 sets - 10 reps   PATIENT EDUCATION: Education details: prognosis, POC, HEP Person educated: Patient Education method: Explanation, Demonstration, Tactile cues, Verbal cues, and Handouts Education comprehension: verbalized understanding and returned demonstration   GOALS: Goals reviewed with patient? Yes  SHORT TERM GOALS: Target date: 08/03/2022  Patient to be independent with initial HEP. Baseline: HEP initiated Goal status: MET    LONG TERM GOALS: Target date: 08/24/2022  Patient to be independent with advanced HEP. Baseline: Not yet initiated  Goal status: IN PROGRESS  Patient to demonstrate B LE strength >/=4+/5.  Baseline: See above Goal status: IN PROGRESS  Patient to demonstrate B ankle dorsiflexion AROM to 10 degrees in order to improve safety with ambulation.  Baseline: 0 and 7 deg Goal status: IN PROGRESS  Patient to complete TUG in <14 sec with LRAD in order to decrease risk of falls.   Baseline: 15.43 sec Goal status: IN PROGRESS  Patient to demonstrate 5xSTS test in <15 sec in order to decrease risk of falls.  Baseline: 16.38 sec Goal status: IN PROGRESS  Patient to score at least 50/56 on Berg in order to decrease risk of falls.  Baseline: 47 Goal status: IN PROGRESS   ASSESSMENT:  CLINICAL  IMPRESSION: Patient arrived to session ambulating with inconsistent AD sequencing with B walking poles. Worked on improving sequencing, particularly with turns which did improve with practice. Postural strengthening activities required cueing for proper muscle activation. Tolerated gentle lumbar stretching well. Encouraged patient to continue practicing use of B poles in the home for max stability. Patient reported understanding of all edu provided and without complaints at end of session.     OBJECTIVE IMPAIRMENTS Abnormal gait, decreased balance, decreased ROM, decreased strength, dizziness, increased muscle spasms, improper body mechanics, postural dysfunction, and pain.   ACTIVITY LIMITATIONS carrying, lifting, bending, standing, stairs, transfers, bathing, and dressing  PARTICIPATION LIMITATIONS: meal prep, cleaning, laundry, driving, shopping, community activity, occupation, yard work, and church  PERSONAL FACTORS Age, Past/current experiences, Profession, Time since onset of injury/illness/exacerbation, and 3+ comorbidities: anemia, GERD, HLD, back surgery, R wrist ORIF  are also affecting patient's functional outcome.   REHAB POTENTIAL: Good  CLINICAL DECISION MAKING: Evolving/moderate complexity  EVALUATION COMPLEXITY: Moderate   PLAN: PT FREQUENCY: 2x/week  PT DURATION: 6 weeks  PLANNED INTERVENTIONS: Therapeutic exercises, Therapeutic activity, Neuromuscular re-education, Balance training, Gait training, Patient/Family education, Self Care, Joint mobilization, Stair training, Vestibular training, Canalith repositioning, Aquatic Therapy, Dry Needling, Electrical stimulation, Cryotherapy, Moist heat, Taping, Manual therapy, and Re-evaluation  PLAN FOR NEXT SESSION: gait train with B poles; work on weight shifting activities and posterior stepping to address posterior LOB, turns   Janene Harvey, PT, DPT 07/22/22 4:23 PM  [facility] [address], [place], [place] [address] Phone # [phone] Fax # [phone]

## 2022-07-22 ENCOUNTER — Ambulatory Visit: Payer: Medicare Other | Admitting: Physical Therapy

## 2022-07-22 ENCOUNTER — Ambulatory Visit (INDEPENDENT_AMBULATORY_CARE_PROVIDER_SITE_OTHER): Payer: Medicare Other

## 2022-07-22 ENCOUNTER — Telehealth: Payer: Self-pay

## 2022-07-22 ENCOUNTER — Encounter: Payer: Self-pay | Admitting: Physical Therapy

## 2022-07-22 VITALS — Ht 59.5 in | Wt 137.0 lb

## 2022-07-22 DIAGNOSIS — R2689 Other abnormalities of gait and mobility: Secondary | ICD-10-CM | POA: Diagnosis not present

## 2022-07-22 DIAGNOSIS — M6281 Muscle weakness (generalized): Secondary | ICD-10-CM

## 2022-07-22 DIAGNOSIS — R293 Abnormal posture: Secondary | ICD-10-CM

## 2022-07-22 DIAGNOSIS — R2681 Unsteadiness on feet: Secondary | ICD-10-CM

## 2022-07-22 DIAGNOSIS — Z0000 Encounter for general adult medical examination without abnormal findings: Secondary | ICD-10-CM

## 2022-07-22 NOTE — Progress Notes (Signed)
Subjective:   Tabitha Adams is a 76 y.o. female who presents for Medicare Annual (Subsequent) preventive examination.  Review of Systems    Virtual Visit via Telephone Note  I connected with  Tabitha Adams on 07/22/22 at  1:30 PM EDT by telephone and verified that I am speaking with the correct person using two identifiers.  Location: Patient: Home Provider: Office Persons participating in the virtual visit: patient/Nurse Health Advisor   I discussed the limitations, risks, security and privacy concerns of performing an evaluation and management service by telephone and the availability of in person appointments. The patient expressed understanding and agreed to proceed.  Interactive audio and video telecommunications were attempted between this nurse and patient, however failed, due to patient having technical difficulties OR patient did not have access to video capability.  We continued and completed visit with audio only.  Some vital signs may be absent or patient reported.   Criselda Peaches, LPN  Cardiac Risk Factors include: advanced age (>9mn, >>72women);hypertension     Objective:    Today's Vitals   07/22/22 1402  Weight: 137 lb (62.1 kg)  Height: 4' 11.5" (1.511 m)   Body mass index is 27.21 kg/m.     07/22/2022    2:12 PM 07/13/2022    1:27 PM 04/19/2022    5:14 PM 07/16/2021    3:29 PM 09/01/2020    2:30 PM 01/22/2019    3:17 PM 01/25/2017   10:00 AM  Advanced Directives  Does Patient Have a Medical Advance Directive? Yes Yes No Yes Yes Yes No  Type of AParamedicof ACantonLiving will   HLynnLiving will HNewmanLiving will HClareLiving will   Does patient want to make changes to medical advance directive?  No - Patient declined       Copy of HColumbiain Chart? No - copy requested   No - copy requested  No - copy requested   Would  patient like information on creating a medical advance directive?   No - Patient declined    No - Patient declined    Current Medications (verified) Outpatient Encounter Medications as of 07/22/2022  Medication Sig   alendronate (FOSAMAX) 70 MG tablet TAKE 1 TABLET BY MOUTH  WEEKLY WITH 8 OZ OF PLAIN  WATER 30 MINUTES BEFORE  FIRST FOOD, DRINK OR MEDS.  STAY UPRIGHT FOR 30 MINS   amLODipine (NORVASC) 2.5 MG tablet TAKE 1 TABLET(2.5 MG) BY MOUTH AT BEDTIME   Apoaequorin (PREVAGEN EXTRA STRENGTH) 20 MG CAPS Take 1 tablet by mouth daily.   b complex vitamins capsule Take 1 capsule by mouth daily.   Calcium-Phosphorus-Vitamin D 2476-546-503MG-MG-UNIT CHEW Chew by mouth.   cetirizine (ZYRTEC) 10 MG tablet Take 10 mg by mouth at bedtime.   Cholecalciferol (VITAMIN D3) 50 MCG (2000 UT) TABS Take by mouth daily.   citalopram (CELEXA) 10 MG tablet TAKE 1 TABLET(10 MG) BY MOUTH DAILY   ferrous sulfate 325 (65 FE) MG tablet Take 325 mg by mouth daily with breakfast.   fluticasone (FLONASE) 50 MCG/ACT nasal spray SHAKE LIQUID AND USE 1 SPRAY IN EACH NOSTRIL TWICE DAILY AS NEEDED FOR ALLERGIES OR RHINITIS   glucosamine-chondroitin 500-400 MG tablet Take 1 tablet by mouth 2 (two) times daily.   losartan (COZAAR) 50 MG tablet TAKE 1 TABLET BY MOUTH  DAILY   meclizine (ANTIVERT) 25 MG tablet TAKE 1/2 TO 1  TABLET BY  MOUTH TWICE DAILY AS NEEDED FOR DIZZINESS   meloxicam (MOBIC) 15 MG tablet TAKE 1 TABLET BY MOUTH  DAILY   montelukast (SINGULAIR) 10 MG tablet TAKE 1 TABLET BY MOUTH AT  BEDTIME   Multiple Vitamin (MULTIVITAMIN WITH MINERALS) TABS tablet Take 1 tablet by mouth daily.   omeprazole (PRILOSEC) 20 MG capsule TAKE 1 CAPSULE BY MOUTH  DAILY   polyethylene glycol (MIRALAX / GLYCOLAX) 17 g packet Take 17 g by mouth daily.   pravastatin (PRAVACHOL) 20 MG tablet TAKE 1 TABLET BY MOUTH  DAILY   traMADol (ULTRAM) 50 MG tablet TAKE 1 TABLET(50 MG) BY MOUTH TWICE DAILY AS NEEDED   vitamin C (VITAMIN C) 1000  MG tablet Take 1 tablet (1,000 mg total) by mouth daily.   No facility-administered encounter medications on file as of 07/22/2022.    Allergies (verified) Cymbalta [duloxetine hcl], Elemental sulfur, and Penicillins   History: Past Medical History:  Diagnosis Date   Allergy    Anemia    Arthritis    GERD (gastroesophageal reflux disease)    Hyperlipidemia    Past Surgical History:  Procedure Laterality Date   APPENDECTOMY     BACK SURGERY     BREAST CYST ASPIRATION Left    BREAST EXCISIONAL BIOPSY Left    BREAST EXCISIONAL BIOPSY Left    CATARACT EXTRACTION, BILATERAL     ORIF WRIST FRACTURE Right 01/25/2017   Procedure: OPEN REDUCTION INTERNAL FIXATION (ORIF) WRIST FRACTURE;  Surgeon: Roseanne Kaufman, MD;  Location: [facility];  Service: Orthopedics;  Laterality: Right;   TOTAL ABDOMINAL HYSTERECTOMY     heavy periods   Family History  Problem Relation Age of Onset   Other Mother        brain tumor, non cancerous   Stroke Father    Hypertension Father    Dementia Father    Arthritis Sister    Hypercholesterolemia Sister    Epilepsy Son    Heart Problems Son    Other Son        breathing problems   Tremor Son    Other Brother        MVA   Social History   Socioeconomic History   Marital status: Widowed    Spouse name: Not on file   Number of children: 3   Years of education: Not on file   Highest education level: Some college, no degree  Occupational History   Occupation: Surveyor, quantity: [employer]    Comment: works 25-30hrs/week  Tobacco Use   Smoking status: Former   Smokeless tobacco: Never  Scientific laboratory technician Use: Never used  Substance and Sexual Activity   Alcohol use: No   Drug use: No   Sexual activity: Not Currently    Comment: 1st intercourse 76 yo-Fewer than 5 partners  Other Topics Concern   Not on file  Social History Narrative   01/22/2019:   Widowed since 10/2017; lives alone now in [place] house with 2 dogs   Has one son, and one  stepson and Psychiatrist. Sons live close by, supportive.   Has 5 grandchildren and several great grandchildren, active in their lives   Continues to work PT as Scientist, water quality, likes to stay busy   [employer] community support system   Social Determinants of Health   Financial Resource Strain: [place]  (07/22/2022)   Overall Financial Resource Strain (CARDIA)    Difficulty of Paying Living Expenses: Not hard at all  Food Insecurity: No Food Insecurity (07/22/2022)   Hunger Vital Sign    Worried About Running Out of Food in the Last Year: Never true    Ran Out of Food in the Last Year: Never true  Transportation Needs: No Transportation Needs (07/22/2022)   PRAPARE - Hydrologist (Medical): No    Lack of Transportation (Non-Medical): No  Physical Activity: Insufficiently Active (07/22/2022)   Exercise Vital Sign    Days of Exercise per Week: 7 days    Minutes of Exercise per Session: 20 min  Stress: No Stress Concern Present (07/22/2022)   [place]    Feeling of Stress : Not at all  Social Connections: Moderately Integrated (07/22/2022)   Social Connection and Isolation Panel [NHANES]    Frequency of Communication with Friends and Family: More than three times a week    Frequency of Social Gatherings with Friends and Family: More than three times a week    Attends Religious Services: More than 4 times per year    Active Member of [employer] or Organizations: Yes    Attends Archivist Meetings: More than 4 times per year    Marital Status: Widowed    Tobacco Counseling Counseling given: Not Answered   Clinical Intake:  Pre-visit preparation completed: Yes  Pain : No/denies pain     BMI - recorded: 27.24 Nutritional Status: BMI 25 -29 Overweight Nutritional Risks: None Diabetes: No  How often do you need to have someone help you when you read instructions, pamphlets, or other written  materials from your doctor or pharmacy?: 1 - Never  Diabetic?  No  Interpreter Needed?: No  Information entered by :: Rolene Arbour LPN   Activities of Daily Living    07/22/2022    2:08 PM 07/19/2022    5:54 PM  In your present state of health, do you have any difficulty performing the following activities:  Hearing? 0 0  Vision? 0 0  Difficulty concentrating or making decisions? 0 0  Walking or climbing stairs? 0 0  Dressing or bathing? 0 0  Doing errands, shopping? 0 0  Preparing Food and eating ? N N  Using the Toilet? N N  In the past six months, have you accidently leaked urine? Tempie Donning  Comment Wears pads. Followed by PCP   Do you have problems with loss of bowel control? N N  Managing your Medications? N N  Managing your Finances? N N  Housekeeping or managing your Housekeeping? N N    Patient Care Team: [name], [name], MD as PCP - General (Family Medicine) Marica Otter, [place] (Optometry) Viona Gilmore, [facility] as Pharmacist (Pharmacist)  Indicate any recent Medical Services you may have received from other than [facility] providers in the past year (date may be approximate).     Assessment:   This is a routine wellness examination for [place].  Hearing/Vision screen Hearing Screening - Comments:: Denies hearing difficulties   Vision Screening - Comments:: Wears reading glasses - up to date with routine eye exams with  Dr Sabra Heck  Dietary issues and exercise activities discussed: Current Exercise Habits: Home exercise routine, Type of exercise: walking, Time (Minutes): 20, Frequency (Times/Week): 7, Weekly Exercise (Minutes/Week): 140, Intensity: Moderate, Exercise limited by: None identified   Goals Addressed               This Visit's Progress     Patient stated (pt-stated)  No falls       Depression Screen    07/22/2022    2:06 PM 06/28/2022    3:36 PM 12/07/2021    4:13 PM 08/28/2021   10:01 PM 07/16/2021    3:31 PM 07/16/2021    3:28 PM 08/06/2020     9:10 PM  PHQ 2/9 Scores  PHQ - 2 Score 0 0 2 1 0 0 0  PHQ- 9 Score 0 '2 7 4   2    '$ Fall Risk    07/22/2022    2:10 PM 07/19/2022    5:54 PM 12/07/2021    4:13 PM 11/11/2021    2:34 PM 08/28/2021   10:02 PM  Fall Risk   Falls in the past year? '1 1 1 1 1  '$ Number falls in past yr: 0 '1 1 1 1  '$ Injury with Fall? '1 1 1 1 1  '$ Comment Head injury. Followed by ER Medical attention      Risk for fall due to : Impaired balance/gait    Impaired balance/gait;Orthopedic patient  Follow up Falls prevention discussed    Education provided    FALL RISK PREVENTION PERTAINING TO THE HOME:  Any stairs in or around the home? Yes If so, are there any without handrails? No  Home free of loose throw rugs in walkways, pet beds, electrical cords, etc?  Adequate lighting in your home to reduce risk of falls? Yes   ASSISTIVE DEVICES UTILIZED TO PREVENT FALLS:  Life alert? No  Use of a cane, walker or w/c? Yes  Grab bars in the bathroom? Yes  Shower chair or bench in shower? Yes  Elevated toilet seat or a handicapped toilet? No   TIMED UP AND GO:  Was the test performed? No . Audio Visit   Cognitive Function:        07/22/2022    2:12 PM  6CIT Screen  What Year? 0 points  What month? 0 points  What time? 0 points  Count back from 20 0 points  Months in reverse 0 points  Repeat phrase 0 points  Total Score 0 points    Immunizations Immunization History  Administered Date(s) Administered   Fluad Quad(high Dose 65+) 08/06/2019, 08/28/2021   Influenza, High Dose Seasonal PF 08/31/2016, 09/07/2017, 07/24/2018   PFIZER(Purple Top)SARS-COV-2 Vaccination 01/05/2020, 01/30/2020, 11/03/2020   Pneumococcal Conjugate-13 01/22/2019   Pneumococcal Polysaccharide-23 07/06/2020   Tdap 11/16/2015   Zoster Recombinat (Shingrix) 11/25/2020    TDAP status: Up to date  Flu Vaccine status: Up to date  Pneumococcal vaccine status: Up to date  Covid-19 vaccine status: Completed vaccines  Qualifies for  Shingles Vaccine? Yes   Zostavax completed Yes   Shingrix Completed?: Yes  Screening Tests Health Maintenance  Topic Date Due   Zoster Vaccines- Shingrix (2 of 2) 01/20/2021   INFLUENZA VACCINE  06/15/2022   COVID-19 Vaccine (4 - Pfizer series) 08/07/2022 (Originally 12/29/2020)   TETANUS/TDAP  11/15/2025   Pneumonia Vaccine 58+ Years old  Completed   DEXA SCAN  Completed   Hepatitis C Screening  Completed   HPV VACCINES  Aged Out   COLONOSCOPY (Pts 45-49yr Insurance coverage will need to be confirmed)  Discontinued    Health Maintenance  Health Maintenance Due  Topic Date Due   Zoster Vaccines- Shingrix (2 of 2) 01/20/2021   INFLUENZA VACCINE  06/15/2022    Colorectal cancer screening: No longer required.   Mammogram status: No longer required due to Age.  Bone Density status: Completed  06/05/19. Results reflect: Bone density results: OSTEOPOROSIS. Repeat every   years.  Lung Cancer Screening: (Low Dose CT Chest recommended if Age 71-80 years, 30 pack-year currently smoking OR have quit w/in 15years.) does not qualify.     Additional Screening:  Hepatitis C Screening: does qualify; Completed 06/11/16  Vision Screening: Recommended annual ophthalmology exams for early detection of glaucoma and other disorders of the eye. Is the patient up to date with their annual eye exam?  Yes  Who is the provider or what is the name of the office in which the patient attends annual eye exams? Dr Sabra Heck If pt is not established with a provider, would they like to be referred to a provider to establish care? No .   Dental Screening: Recommended annual dental exams for proper oral hygiene  Community Resource Referral / Chronic Care Management: CRR required this visit?  No   CCM required this visit?  No      Plan:     I have personally reviewed and noted the following in the patient's chart:   Medical and social history Use of alcohol, tobacco or illicit drugs  Current  medications and supplements including opioid prescriptions. Patient is not currently taking opioid prescriptions. Functional ability and status Nutritional status Physical activity Advanced directives List of other physicians Hospitalizations, surgeries, and ER visits in previous 12 months Vitals Screenings to include cognitive, depression, and falls Referrals and appointments  In addition, I have reviewed and discussed with patient certain preventive protocols, quality metrics, and best practice recommendations. A written personalized care plan for preventive services as well as general preventive health recommendations were provided to patient.     Criselda Peaches, LPN   06/21/7671   Nurse Notes: None

## 2022-07-22 NOTE — Patient Instructions (Addendum)
[name] , Thank you for taking time to come for your Medicare Wellness Visit. I appreciate your ongoing commitment to your health goals. Please review the following plan we discussed and let me know if I can assist you in the future.   Screening recommendations/referrals: Colonoscopy: No longer required Mammogram: No longer required Bone Density: Done Recommended yearly ophthalmology/optometry visit for glaucoma screening and checkup Recommended yearly dental visit for hygiene and checkup  Vaccinations: Influenza vaccine: Up to date Pneumococcal vaccine: Up to date Tdap vaccine: Up to date Shingles vaccine: Done   Covid-19:Done  Advanced directives: Please bring a copy of your health care power of attorney and living will to the office to be added to your chart at your convenience.   Conditions/risks identified: None  Next appointment: Follow up in one year for your annual wellness visit     Preventive Care 65 Years and Older, Female Preventive care refers to lifestyle choices and visits with your health care provider that can promote health and wellness. What does preventive care include? A yearly physical exam. This is also called an annual well check. Dental exams once or twice a year. Routine eye exams. Ask your health care provider how often you should have your eyes checked. Personal lifestyle choices, including: Daily care of your teeth and gums. Regular physical activity. Eating a healthy diet. Avoiding tobacco and drug use. Limiting alcohol use. Practicing safe sex. Taking low-dose aspirin every day. Taking vitamin and mineral supplements as recommended by your health care provider. What happens during an annual well check? The services and screenings done by your health care provider during your annual well check will depend on your age, overall health, lifestyle risk factors, and family history of disease. Counseling  Your health care provider may ask you  questions about your: Alcohol use. Tobacco use. Drug use. Emotional well-being. Home and relationship well-being. Sexual activity. Eating habits. History of falls. Memory and ability to understand (cognition). Work and work Statistician. Reproductive health. Screening  You may have the following tests or measurements: Height, weight, and BMI. Blood pressure. Lipid and cholesterol levels. These may be checked every 5 years, or more frequently if you are over 76 years old. Skin check. Lung cancer screening. You may have this screening every year starting at age 76 if you have a 30-pack-year history of smoking and currently smoke or have quit within the past 15 years. Fecal occult blood test (FOBT) of the stool. You may have this test every year starting at age 76. Flexible sigmoidoscopy or colonoscopy. You may have a sigmoidoscopy every 5 years or a colonoscopy every 10 years starting at age 76. Hepatitis C blood test. Hepatitis B blood test. Sexually transmitted disease (STD) testing. Diabetes screening. This is done by checking your blood sugar (glucose) after you have not eaten for a while (fasting). You may have this done every 1-3 years. Bone density scan. This is done to screen for osteoporosis. You may have this done starting at age 76. Mammogram. This may be done every 1-2 years. Talk to your health care provider about how often you should have regular mammograms. Talk with your health care provider about your test results, treatment options, and if necessary, the need for more tests. Vaccines  Your health care provider may recommend certain vaccines, such as: Influenza vaccine. This is recommended every year. Tetanus, diphtheria, and acellular pertussis (Tdap, Td) vaccine. You may need a Td booster every 10 years. Zoster vaccine. You may need this after  age 76. Pneumococcal 13-valent conjugate (PCV13) vaccine. One dose is recommended after age 76. Pneumococcal polysaccharide  (PPSV23) vaccine. One dose is recommended after age 76. Talk to your health care provider about which screenings and vaccines you need and how often you need them. This information is not intended to replace advice given to you by your health care provider. Make sure you discuss any questions you have with your health care provider. Document Released: 11/28/2015 Document Revised: 07/21/2016 Document Reviewed: 09/02/2015 Elsevier Interactive Patient Education  2017 [employer] [place] Prevention in the Home Falls can cause injuries. They can happen to people of all ages. There are many things you can do to make your home safe and to help prevent falls. What can I do on the outside of my home? Regularly fix the edges of walkways and driveways and fix any cracks. Remove anything that might make you trip as you walk through a door, such as a raised step or threshold. Trim any bushes or trees on the path to your home. Use bright outdoor lighting. Clear any walking paths of anything that might make someone trip, such as rocks or tools. Regularly check to see if handrails are loose or broken. Make sure that both sides of any steps have handrails. Any raised decks and porches should have guardrails on the edges. Have any leaves, snow, or ice cleared regularly. Use sand or salt on walking paths during winter. Clean up any spills in your garage right away. This includes oil or grease spills. What can I do in the bathroom? Use night lights. Install grab bars by the toilet and in the tub and shower. Do not use towel bars as grab bars. Use non-skid mats or decals in the tub or shower. If you need to sit down in the shower, use a plastic, non-slip stool. Keep the floor dry. Clean up any water that spills on the floor as soon as it happens. Remove soap buildup in the tub or shower regularly. Attach bath mats securely with double-sided non-slip rug tape. Do not have throw rugs and other things on the  floor that can make you trip. What can I do in the bedroom? Use night lights. Make sure that you have a light by your bed that is easy to reach. Do not use any sheets or blankets that are too big for your bed. They should not hang down onto the floor. Have a firm chair that has side arms. You can use this for support while you get dressed. Do not have throw rugs and other things on the floor that can make you trip. What can I do in the kitchen? Clean up any spills right away. Avoid walking on wet floors. Keep items that you use a lot in easy-to-reach places. If you need to reach something above you, use a strong step stool that has a grab bar. Keep electrical cords out of the way. Do not use floor polish or wax that makes floors slippery. If you must use wax, use non-skid floor wax. Do not have throw rugs and other things on the floor that can make you trip. What can I do with my stairs? Do not leave any items on the stairs. Make sure that there are handrails on both sides of the stairs and use them. Fix handrails that are broken or loose. Make sure that handrails are as long as the stairways. Check any carpeting to make sure that it is firmly attached to the stairs.  Fix any carpet that is loose or worn. Avoid having throw rugs at the top or bottom of the stairs. If you do have throw rugs, attach them to the floor with carpet tape. Make sure that you have a light switch at the top of the stairs and the bottom of the stairs. If you do not have them, ask someone to add them for you. What else can I do to help prevent falls? Wear shoes that: Do not have high heels. Have rubber bottoms. Are comfortable and fit you well. Are closed at the toe. Do not wear sandals. If you use a stepladder: Make sure that it is fully opened. Do not climb a closed stepladder. Make sure that both sides of the stepladder are locked into place. Ask someone to hold it for you, if possible. Clearly mark and make  sure that you can see: Any grab bars or handrails. First and last steps. Where the edge of each step is. Use tools that help you move around (mobility aids) if they are needed. These include: Canes. Walkers. Scooters. Crutches. Turn on the lights when you go into a dark area. Replace any light bulbs as soon as they burn out. Set up your furniture so you have a clear path. Avoid moving your furniture around. If any of your floors are uneven, fix them. If there are any pets around you, be aware of where they are. Review your medicines with your doctor. Some medicines can make you feel dizzy. This can increase your chance of falling. Ask your doctor what other things that you can do to help prevent falls. This information is not intended to replace advice given to you by your health care provider. Make sure you discuss any questions you have with your health care provider. Document Released: 08/28/2009 Document Revised: 04/08/2016 Document Reviewed: 12/06/2014 Elsevier Interactive Patient Education  2017 [employer].

## 2022-07-26 ENCOUNTER — Other Ambulatory Visit: Payer: Self-pay | Admitting: Family Medicine

## 2022-07-26 DIAGNOSIS — G894 Chronic pain syndrome: Secondary | ICD-10-CM

## 2022-07-26 DIAGNOSIS — M159 Polyosteoarthritis, unspecified: Secondary | ICD-10-CM

## 2022-07-26 MED ORDER — TRAMADOL HCL 50 MG PO TABS: 50.0000 mg | ORAL_TABLET | Freq: Two times a day (BID) | ORAL | 0 refills | Status: DC | PRN

## 2022-07-26 NOTE — Telephone Encounter (Signed)
She has made an appointment for 9/29.

## 2022-07-26 NOTE — Telephone Encounter (Signed)
Requesting refill of traMADol (ULTRAM) 50 MG tablet  pt has OV 08/13/22

## 2022-07-27 ENCOUNTER — Ambulatory Visit: Payer: Medicare Other

## 2022-07-27 DIAGNOSIS — R2681 Unsteadiness on feet: Secondary | ICD-10-CM

## 2022-07-27 DIAGNOSIS — R2689 Other abnormalities of gait and mobility: Secondary | ICD-10-CM

## 2022-07-27 DIAGNOSIS — R293 Abnormal posture: Secondary | ICD-10-CM

## 2022-07-27 DIAGNOSIS — M6281 Muscle weakness (generalized): Secondary | ICD-10-CM

## 2022-07-27 NOTE — Therapy (Signed)
OUTPATIENT PHYSICAL THERAPY NEURO TREATMENT     Patient Name: Tabitha Adams MRN: [id number] DOB:Aug 09, 1946, 76 y.o., female Today's Date: 07/27/2022  PCP: [name], [name], MD REFERRING PROVIDER: Eulas Post, MD    PT End of Session - 07/27/22 1531     Visit Number 5    Number of Visits 13    Date for PT Re-Evaluation 08/24/22    Authorization Type [employer] Medicare 2023    PT Start Time 7829    PT Stop Time 5621    PT Time Calculation (min) 45 min    Equipment Utilized During Treatment Gait belt    Activity Tolerance Patient tolerated treatment well    Behavior During Therapy WFL for tasks assessed/performed               Past Medical History:  Diagnosis Date   Allergy    Anemia    Arthritis    GERD (gastroesophageal reflux disease)    Hyperlipidemia    Past Surgical History:  Procedure Laterality Date   APPENDECTOMY     BACK SURGERY     BREAST CYST ASPIRATION Left    BREAST EXCISIONAL BIOPSY Left    BREAST EXCISIONAL BIOPSY Left    CATARACT EXTRACTION, BILATERAL     ORIF WRIST FRACTURE Right 01/25/2017   Procedure: OPEN REDUCTION INTERNAL FIXATION (ORIF) WRIST FRACTURE;  Surgeon: Roseanne Kaufman, MD;  Location: [facility];  Service: Orthopedics;  Laterality: Right;   TOTAL ABDOMINAL HYSTERECTOMY     heavy periods   Patient Active Problem List   Diagnosis Date Noted   Scoliosis 06/28/2022   Prediabetes 08/28/2021   Vaginal vault prolapse 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 03/03/2020   Cystocele, midline 03/03/2020   Benign essential tremor 07/24/2018   Mild recurrent major depression ([place]) 03/22/2018   Benign paroxysmal positional vertigo due to bilateral vestibular disorder 09/27/2017   Barton's fracture of right radius 01/25/2017   Essential hypertension, benign 10/04/2016   Generalized osteoarthritis of multiple sites 08/31/2016   Chronic back pain 07/13/2016   Hyperlipidemia 07/13/2016   Chronic allergic rhinitis 07/13/2016   GERD (gastroesophageal reflux  disease) 07/13/2016   Chronic pain disorder 07/13/2016    ONSET DATE: past 2 months  REFERRING DIAG: Burchette, Alinda Sierras, MD  THERAPY DIAG:  Unsteadiness on feet  Other abnormalities of gait and mobility  Muscle weakness (generalized)  Abnormal posture  Rationale for Evaluation and Treatment Rehabilitation  SUBJECTIVE:   SUBJECTIVE STATEMENT: Back is sore, 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 Yes: NPRS scale: 4/10 Pain location: LB  Pain description: aching Aggravating factors: standing Relieving factors: back brace  PRECAUTIONS: Fall  PATIENT GOALS "get to where my balance is better"  OBJECTIVE:   TODAY'S TREATMENT: 07/27/22 Activity Comments  Red TB row 2x10 Good form  Red TB extension 2x10 Difficulty with keeping right elbow straight  Farmer's carry 6# x 6 laps Along counter  Forward/backward over pole 10x counter and pole support  Lumbar lateral shift standing with R/L arm on wall 10x3"   Prayer stretch to R/L diagonal 10x3'' Green physioball  STS with 6# weight 2x10   Gait training Trekking poles adjusted, demo use of cinch straps. Cues in stride length   Standing on foam EO/EC x 15 sec Alt punches EO/EC 10x Shoulder abd/add EO/EC 10x  TODAY'S TREATMENT: 07/22/22 Activity Comments  gait trianing with B walking poles 164f Cueing to increase step length and consistency of stepping with turns   Red TB row 2x10 Cues to retract scapulae   Red TB extension 2x10 Cues to maintain extended elbows  Lumbar lateral shift standing with R arm on wall 10x3" Cueing for positioning  Prayer stretch to L diagonal 10x3' Limited amplitude  Gait training with B walking poles on outside sidewalk x150 ft Slow speed but improved AD sequencing   STS with green medball 10x Cues to shift hips forward upon standing and shift weight  anteriorly over toes to correct posterior LOB  staggered ant/pos wt shift 15x each  Performed without UE support; cueing to increase anterior wt shift onto L LE    HOME EXERCISE PROGRAM Last updated: 07/22/22 Access Code: [email or web address]: [email or web address] Date: 07/22/2022 [facility]: [facility]  Exercises - Sit to Stand Without Arm Support  - 1 x daily - 5 x weekly - 2 sets - 10 reps - Standing Hip Abduction with Counter Support  - 1 x daily - 5 x weekly - 2 sets - 10 reps - Heel Raises with Counter Support  - 1 x daily - 5 x weekly - 2 sets - 10 reps - Gastroc Stretch on Wall  - 1 x daily - 5 x weekly - 2 sets - 30 sec hold - Forward Backward Weight Shift with Counter Support  - 1 x daily - 5 x weekly - 2 sets - 10 reps - Staggered Stance Forward Backward Weight Shift with Unilateral Counter Support  - 1 x daily - 5 x weekly - 2 sets - 10 reps - Lateral Shift Correction at Wall  - 1 x daily - 5 x weekly - 2 sets - 10 reps - Seated Thoracic Flexion and Rotation with Swiss Ball  - 1 x daily - 5 x weekly - 2 sets - 10 reps - Standing Shoulder Row with Anchored Resistance  - 1 x daily - 5 x weekly - 2 sets - 10 reps - Shoulder extension with resistance - Neutral  - 1 x daily - 5 x weekly - 2 sets - 10 reps    PATIENT EDUCATION: Education details: encouraged continued practice ambulating with B walking poles indoors, HEP update Person educated: Patient Education method: Explanation, Demonstration, Tactile cues, Verbal cues, and Handouts Education comprehension: verbalized understanding and returned demonstration   Below measures were taken at time of initial evaluation unless otherwise specified:   DIAGNOSTIC FINDINGS: none recent  COGNITION: Overall cognitive status: Within functional limits for tasks assessed   SENSATION: WFL  POSTURE: rounded shoulders, forward head, increased thoracic kyphosis, and thoracic R concave  scoliotic curve with R shoulder depressed  AROM:  R ankle dorsiflexion: 7 deg L ankle dorsiflexion: 0 deg  LOWER EXTREMITY MMT:   MMT (in sitting) Right eval Left eval  Hip flexion 5 4  Hip abduction 4- 4-  Hip adduction 4+ 4+  Hip internal rotation    Hip external rotation    Knee flexion 5 4  Knee extension 5 5  Ankle dorsiflexion 4+ 4+  Ankle plantarflexion 4 4  Ankle inversion    Ankle eversion    (Blank rows = not tested)  GAIT: Gait pattern:  narrow BOS with hip instability and decreased R step length; trunk flexed   Assistive device utilized: None Level of assistance: Modified independence   FUNCTIONAL TESTs:  Berg: 47/56     HEP:  Access Code: [email or web address] URL: [email or web address] Date: 07/13/2022 Prepared by: [facility]  Exercises - Sit to Stand Without Arm Support  - 1 x daily - 5 x weekly - 2 sets - 10 reps - Standing Hip Abduction with Counter Support  - 1 x daily - 5 x weekly - 2 sets - 10 reps - Heel Raises with Counter Support  - 1 x daily - 5 x weekly - 2 sets - 10 reps - Gastroc Stretch on Wall  - 1 x daily - 5 x weekly - 2 sets - 30 sec hold - Forward Backward Weight Shift with Counter Support  - 1 x daily - 5 x weekly - 2 sets - 10 reps   PATIENT EDUCATION: Education details: prognosis, POC, HEP Person educated: Patient Education method: Explanation, Demonstration, Tactile cues, Verbal cues, and Handouts Education comprehension: verbalized understanding and returned demonstration   GOALS: Goals reviewed with patient? Yes  SHORT TERM GOALS: Target date: 08/03/2022  Patient to be independent with initial HEP. Baseline: HEP initiated Goal status: MET    LONG TERM GOALS: Target date: 08/24/2022  Patient to be independent with advanced HEP. Baseline: Not yet initiated  Goal status: IN PROGRESS  Patient to demonstrate B LE strength >/=4+/5.  Baseline: See above Goal status: IN  PROGRESS  Patient to demonstrate B ankle dorsiflexion AROM to 10 degrees in order to improve safety with ambulation.  Baseline: 0 and 7 deg Goal status: IN PROGRESS  Patient to complete TUG in <14 sec with LRAD in order to decrease risk of falls.   Baseline: 15.43 sec Goal status: IN PROGRESS  Patient to demonstrate 5xSTS test in <15 sec in order to decrease risk of falls.  Baseline: 16.38 sec Goal status: IN PROGRESS  Patient to score at least 50/56 on Berg in order to decrease risk of falls.  Baseline: 47 Goal status: IN PROGRESS   ASSESSMENT:  CLINICAL IMPRESSION: Continued with balance and gait training to facilitate dynamic balance and righting reactions to reduce risk for falls with emphasis on integrating use of trekking poles to negotiate unstable/uneven environments and demo use of cinch strap for poles to encourage use for pushing forward to promote large step length with improved effect and carryover. Able to perform standing on foam with normal-mild sway with eyes open/closed conditions and with added perturbation of UE movements. Continued sessions to progress balance and gait activities to reduce risk for falls.     OBJECTIVE IMPAIRMENTS Abnormal gait, decreased balance, decreased ROM, decreased strength, dizziness, increased muscle spasms, improper body mechanics, postural dysfunction, and pain.   ACTIVITY LIMITATIONS carrying, lifting, bending, standing, stairs, transfers, bathing, and dressing  PARTICIPATION LIMITATIONS: meal prep, cleaning, laundry, driving, shopping, community activity, occupation, yard work, and church  PERSONAL FACTORS Age, Past/current experiences, Profession, Time since onset of injury/illness/exacerbation, and 3+ comorbidities: anemia, GERD, HLD, back surgery, R wrist ORIF  are also affecting patient's functional outcome.   REHAB POTENTIAL: Good  CLINICAL DECISION MAKING: Evolving/moderate complexity  EVALUATION COMPLEXITY:  Moderate   PLAN: PT FREQUENCY: 2x/week  PT DURATION: 6 weeks  PLANNED INTERVENTIONS: Therapeutic exercises, Therapeutic activity, Neuromuscular re-education, Balance training, Gait training, Patient/Family education, Self Care, Joint mobilization, Stair training, Vestibular training, Canalith repositioning, Aquatic Therapy, Dry Needling, Electrical stimulation, Cryotherapy, Moist heat, Taping, Manual therapy, and Re-evaluation  PLAN FOR NEXT SESSION: gait train with B poles; work on weight shifting activities and posterior stepping  to address posterior LOB, turns  3:32 PM, 07/27/22 M. Sherlyn Lees, PT, DPT Physical Therapist- [place] Office Number: [phone]   [facility] [address] [place], [place] [address] Phone # [phone] Fax # [phone]

## 2022-07-28 NOTE — Therapy (Signed)
OUTPATIENT PHYSICAL THERAPY NEURO TREATMENT     Patient Name: Tabitha Adams MRN: [id number] DOB:01/03/1946, 76 y.o., female Today's Date: 07/29/2022  PCP: [name], [name], MD REFERRING PROVIDER: Eulas Post, MD    PT End of Session - 07/29/22 1609     Visit Number 6    Number of Visits 13    Date for PT Re-Evaluation 08/24/22    Authorization Type U[employer] Medicare 2023    PT Start Time 1525    PT Stop Time 1608    PT Time Calculation (min) 43 min    Equipment Utilized During Treatment Gait belt    Activity Tolerance Patient tolerated treatment well    Behavior During Therapy WFL for tasks assessed/performed                Past Medical History:  Diagnosis Date   Allergy    Anemia    Arthritis    GERD (gastroesophageal reflux disease)    Hyperlipidemia    Past Surgical History:  Procedure Laterality Date   APPENDECTOMY     BACK SURGERY     BREAST CYST ASPIRATION Left    BREAST EXCISIONAL BIOPSY Left    BREAST EXCISIONAL BIOPSY Left    CATARACT EXTRACTION, BILATERAL     ORIF WRIST FRACTURE Right 01/25/2017   Procedure: OPEN REDUCTION INTERNAL FIXATION (ORIF) WRIST FRACTURE;  Surgeon: Roseanne Kaufman, MD;  Location: [facility];  Service: Orthopedics;  Laterality: Right;   TOTAL ABDOMINAL HYSTERECTOMY     heavy periods   Patient Active Problem List   Diagnosis Date Noted   Scoliosis 06/28/2022   Prediabetes 08/28/2021   Vaginal vault prolapse 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 03/03/2020   Cystocele, midline 03/03/2020   Benign essential tremor 07/24/2018   Mild recurrent major depression ([place]) 03/22/2018   Benign paroxysmal positional vertigo due to bilateral vestibular disorder 09/27/2017   Barton's fracture of right radius 01/25/2017   Essential hypertension, benign 10/04/2016   Generalized osteoarthritis of multiple sites 08/31/2016   Chronic back pain 07/13/2016   Hyperlipidemia 07/13/2016   Chronic allergic rhinitis 07/13/2016   GERD (gastroesophageal reflux  disease) 07/13/2016   Chronic pain disorder 07/13/2016    ONSET DATE: past 2 months  REFERRING DIAG: Burchette, Alinda Sierras, MD  THERAPY DIAG:  Unsteadiness on feet  Other abnormalities of gait and mobility  Muscle weakness (generalized)  Abnormal posture  Rationale for Evaluation and Treatment Rehabilitation  SUBJECTIVE:   SUBJECTIVE STATEMENT: Feeling well- much better than yesterday. Had something that didn't agree with her stomach.   Pt accompanied by: self  PERTINENT HISTORY: anemia, GERD, HLD, back surgery, R wrist ORIF   PAIN:  Are you having pain? No  PRECAUTIONS: Fall  PATIENT GOALS "get to where my balance is better"  OBJECTIVE:     TODAY'S TREATMENT: 07/29/22 Activity Comments  thoracic extension at wall 10x3"    open book stretch at wall 10x each Good tolerance; cues for wider stance and correction of position of Ues   horizontal shoulder ABD TB 2x10 Cues for tall posture and improved eccentric control   STS on foam 10x  CGA; limited eccentric control  STS EC 10x Cues to shift wt anteriorly over toes   staggered ant/pos wt shift + arm swing 20x CGA; initial moderate sway which improved with practice; cues for wider BOS  wide stance side to side wt shifts + lateral reach Cues for large amplitude reach; good stability and wt shift  sidestepping over hurdles without UE support and  CGA 10x Cueing for safe foot positioning and increased step length  backwards stepping over hurdle  More imbalance when stepping back with L LE; requiring 1 UE support   backwards walk 2x3f CGA; cues for belly button forward, wide BOS, long step length    HOME EXERCISE PROGRAM Last updated: 07/22/22 Access Code: [email or web address]: [email or web address] Date: 07/22/2022 Prepared by: [facility]  Exercises - Sit to Stand Without Arm Support  - 1 x daily - 5 x weekly - 2 sets - 10 reps - Standing Hip Abduction with Counter  Support  - 1 x daily - 5 x weekly - 2 sets - 10 reps - Heel Raises with Counter Support  - 1 x daily - 5 x weekly - 2 sets - 10 reps - Gastroc Stretch on Wall  - 1 x daily - 5 x weekly - 2 sets - 30 sec hold - Forward Backward Weight Shift with Counter Support  - 1 x daily - 5 x weekly - 2 sets - 10 reps - Staggered Stance Forward Backward Weight Shift with Unilateral Counter Support  - 1 x daily - 5 x weekly - 2 sets - 10 reps - Lateral Shift Correction at Wall  - 1 x daily - 5 x weekly - 2 sets - 10 reps - Seated Thoracic Flexion and Rotation with Swiss Ball  - 1 x daily - 5 x weekly - 2 sets - 10 reps - Standing Shoulder Row with Anchored Resistance  - 1 x daily - 5 x weekly - 2 sets - 10 reps - Shoulder extension with resistance - Neutral  - 1 x daily - 5 x weekly - 2 sets - 10 reps   Below measures were taken at time of initial evaluation unless otherwise specified:   DIAGNOSTIC FINDINGS: none recent  COGNITION: Overall cognitive status: Within functional limits for tasks assessed   SENSATION: WFL  POSTURE: rounded shoulders, forward head, increased thoracic kyphosis, and thoracic R concave scoliotic curve with R shoulder depressed  AROM:  R ankle dorsiflexion: 7 deg L ankle dorsiflexion: 0 deg  LOWER EXTREMITY MMT:   MMT (in sitting) Right eval Left eval  Hip flexion 5 4  Hip abduction 4- 4-  Hip adduction 4+ 4+  Hip internal rotation    Hip external rotation    Knee flexion 5 4  Knee extension 5 5  Ankle dorsiflexion 4+ 4+  Ankle plantarflexion 4 4  Ankle inversion    Ankle eversion    (Blank rows = not tested)  GAIT: Gait pattern:  narrow BOS with hip instability and decreased R step length; trunk flexed   Assistive device utilized: None Level of assistance: Modified independence   FUNCTIONAL TESTs:  Berg: 47/56     HEP:  Access Code: [email or web address]: [email or web address] Date: 07/13/2022 Prepared by: [facility]  Exercises - Sit to Stand Without Arm Support  - 1 x daily - 5 x weekly - 2 sets - 10 reps - Standing Hip Abduction with Counter Support  - 1 x daily - 5 x weekly - 2 sets - 10 reps - Heel Raises with Counter Support  - 1 x daily - 5 x weekly - 2 sets - 10 reps - Gastroc Stretch on Wall  - 1 x daily - 5 x weekly - 2 sets - 30 sec hold - Forward Backward Weight Shift with Counter Support  -  1 x daily - 5 x weekly - 2 sets - 10 reps   PATIENT EDUCATION: Education details: prognosis, POC, HEP Person educated: Patient Education method: Explanation, Demonstration, Tactile cues, Verbal cues, and Handouts Education comprehension: verbalized understanding and returned demonstration   GOALS: Goals reviewed with patient? Yes  SHORT TERM GOALS: Target date: 08/03/2022  Patient to be independent with initial HEP. Baseline: HEP initiated Goal status: MET    LONG TERM GOALS: Target date: 08/24/2022  Patient to be independent with advanced HEP. Baseline: Not yet initiated  Goal status: IN PROGRESS  Patient to demonstrate B LE strength >/=4+/5.  Baseline: See above Goal status: IN PROGRESS  Patient to demonstrate B ankle dorsiflexion AROM to 10 degrees in order to improve safety with ambulation.  Baseline: 0 and 7 deg Goal status: IN PROGRESS  Patient to complete TUG in <14 sec with LRAD in order to decrease risk of falls.   Baseline: 15.43 sec Goal status: IN PROGRESS  Patient to demonstrate 5xSTS test in <15 sec in order to decrease risk of falls.  Baseline: 16.38 sec Goal status: IN PROGRESS  Patient to score at least 50/56 on Berg in order to decrease risk of falls.  Baseline: 47 Goal status: IN PROGRESS   ASSESSMENT:  CLINICAL IMPRESSION: Patient arrived to session without new complaints. Worked on progressive periscapular strengthening activities and thoracic ROM stretching. Patient noted good improvement in back pain from her HEP, which she  continues to perform. STS transfers with added balance challenges revealed limited eccentric control and tendency to shift posteriorly. Stepping over obstacles required cueing for foot positioning and increased step length; most difficulty evident with stepping back with L LE. Patient reported understanding of all edu provided and without complaints at end of session.      OBJECTIVE IMPAIRMENTS Abnormal gait, decreased balance, decreased ROM, decreased strength, dizziness, increased muscle spasms, improper body mechanics, postural dysfunction, and pain.   ACTIVITY LIMITATIONS carrying, lifting, bending, standing, stairs, transfers, bathing, and dressing  PARTICIPATION LIMITATIONS: meal prep, cleaning, laundry, driving, shopping, community activity, occupation, yard work, and church  PERSONAL FACTORS Age, Past/current experiences, Profession, Time since onset of injury/illness/exacerbation, and 3+ comorbidities: anemia, GERD, HLD, back surgery, R wrist ORIF  are also affecting patient's functional outcome.   REHAB POTENTIAL: Good  CLINICAL DECISION MAKING: Evolving/moderate complexity  EVALUATION COMPLEXITY: Moderate   PLAN: PT FREQUENCY: 2x/week  PT DURATION: 6 weeks  PLANNED INTERVENTIONS: Therapeutic exercises, Therapeutic activity, Neuromuscular re-education, Balance training, Gait training, Patient/Family education, Self Care, Joint mobilization, Stair training, Vestibular training, Canalith repositioning, Aquatic Therapy, Dry Needling, Electrical stimulation, Cryotherapy, Moist heat, Taping, Manual therapy, and Re-evaluation  PLAN FOR NEXT SESSION: gait train with B poles; work on weight shifting activities and posterior stepping to address posterior LOB, turns   Janene Harvey, PT, DPT 07/29/22 4:11 PM  [facility] [address], [place], [place] [address] Phone # [phone] Fax # [phone]

## 2022-07-29 ENCOUNTER — Encounter: Payer: Self-pay | Admitting: Physical Therapy

## 2022-07-29 ENCOUNTER — Ambulatory Visit: Payer: Medicare Other | Admitting: Physical Therapy

## 2022-07-29 DIAGNOSIS — R2689 Other abnormalities of gait and mobility: Secondary | ICD-10-CM | POA: Diagnosis not present

## 2022-07-29 DIAGNOSIS — R2681 Unsteadiness on feet: Secondary | ICD-10-CM | POA: Diagnosis not present

## 2022-07-29 DIAGNOSIS — R293 Abnormal posture: Secondary | ICD-10-CM

## 2022-07-29 DIAGNOSIS — M6281 Muscle weakness (generalized): Secondary | ICD-10-CM

## 2022-07-30 NOTE — Telephone Encounter (Signed)
This encounter was completed on 07/22/22 and co- signed by PCP.

## 2022-08-04 DIAGNOSIS — M25512 Pain in left shoulder: Secondary | ICD-10-CM | POA: Diagnosis not present

## 2022-08-04 DIAGNOSIS — S0990XA Unspecified injury of head, initial encounter: Secondary | ICD-10-CM | POA: Diagnosis not present

## 2022-08-04 DIAGNOSIS — D649 Anemia, unspecified: Secondary | ICD-10-CM | POA: Diagnosis not present

## 2022-08-04 DIAGNOSIS — S199XXA Unspecified injury of neck, initial encounter: Secondary | ICD-10-CM | POA: Diagnosis not present

## 2022-08-04 DIAGNOSIS — S0003XA Contusion of scalp, initial encounter: Secondary | ICD-10-CM | POA: Diagnosis not present

## 2022-08-04 DIAGNOSIS — E871 Hypo-osmolality and hyponatremia: Secondary | ICD-10-CM | POA: Diagnosis not present

## 2022-08-11 NOTE — Progress Notes (Signed)
HPI: Tabitha Adams is a 76 y.o. female, who is here today for follow-up. She was last seen on 12/07/2021.  Since her last visit she was in [place] to attend her grandniece wedding. She fell in the bathroom's hotel, 08/04/22. Hit her head, small scalp excoriation that did not request sutures. Cervical CT were done:  1. No evidence of acute fracture.  2. Multilevel degenerative findings.   Head CT: 1. Left temporoparietal scalp swelling.  2. No evidence of intracranial hemorrhage.  3. Senescent features.   Still having "little" headache, it has improved  Hypertension on losartan 50 mg daily and amlodipine 2.5 mg daily. Negative for visual changes, chest pain, dyspnea, palpitation,focal weakness, or edema.  Labs done on 08/04/22 Order: [id number]  Ref Range & Units 9 d ago  Sodium 136 - 145 mmol/L 130 Low    Potassium 3.5 - 5.1 mmol/L 3.5   Chloride 96 - 111 mmol/L 102   CO2 Carbon Dioxide 20 - 30 mmol/L 29   BUN Urea Nitrogen 6 - 24 mg/dL 18   Creatinine 0.53 - 1.26 mg/dL 0.82   GYI:RSWNIOEVOJ Ratio 6 - 25 22   Glucose 70 - 99 mg/dL 125 High    Calcium 8.3 - 10.1 mg/dL 9.5   Anion Gap 6 - 18 3 Low    Comment: Anion gap = (Na+K) - (Cl+CO2)  GFR Glomerular Filtration Rate >60.0 mL/min/1.39m2 74.1     Contains abnormal data CBC with Differential reflex Manual Diff Order: [id number] Ref Range & Units 9 d ago  WBC White Blood Count 3.7 - 11.8 10*3/L 9.7   RBC Red Blood Count 3.89 - 5.79 10*6/L 3.99   HGB Hemoglobin 11.9 - 16.3 g/dL 11.5 Low    HCT Hematocrit 37.0 - 47.7 % 35.4 Low    MCV Mean Cell Volume 80 - 101 fL 89   MCH Mean Cell Hemoglobin 24.9 - 33.8 pg 28.8   MCHC Mean Cell Hgb Conc 31.1 - 35.3 g/dL 32.5   RDW-CV 11.6 - 16.8 % 12.7   RDW-SD 37.1 - 49.8 fL 41.8   PLT Platelet Count 150 - 400 10*3/L 267   MPV Mean Platelet Volume 8.6 - 12.0 fL 8.6   Neutrophils % Percent reference range not established % 83   Immature Granulocytes % (Auto) Percent  reference range not established % 0.1   Lymphocytes % Percent reference range not established % 10   Monocytes % Percent reference range not established % 6   Eosinophils % Percent reference range not established % 1   Basophils % Percent reference range not established % 0   Neutrophils Absolute 1.88 - 7.60 10*3/uL 8.04 High    Immature Granulocytes Absolute <0.1 10*3/L 0.01   Lymphocytes Absolute 0.95 - 3.62 10*3/uL 0.95   Monocytes Absolute 0.30 - 0.90 10*3/uL 0.62   Eosinophils Absolute 0.00 - 0.50 10*3/uL 0.10   Basophils Absolute 0.00 - 0.10 10*3/uL 0.02   Resulting Agency     Depression: She is on Celexa 10 mg daily. Reporting symptoms as well controlled.     08/13/2022    3:32 PM 07/22/2022    2:06 PM 06/28/2022    3:36 PM 12/07/2021    4:13 PM 08/28/2021   10:01 PM  Depression screen PHQ 2/9  Decreased Interest 0 0 0 1 0  Down, Depressed, Hopeless 0 0 0 1 1  PHQ - 2 Score 0 0 0 2 1  Altered sleeping 0 0 0 2 0  Tired,  decreased energy 0 0 '2 2 2  '$ Change in appetite 0 0 0 1 0  Feeling bad or failure about yourself  0 0 0 0 1  Trouble concentrating 0 0 0 0 0  Moving slowly or fidgety/restless 0 0 0 0 0  Suicidal thoughts 0 0 0 0 0  PHQ-9 Score 0 0 '2 7 4  '$ Difficult doing work/chores Not difficult at all Not difficult at all Not difficult at all  Not difficult at all   Chronic pain, generalized OA: She is on Tramadol 50 mg bid and Meloxicam 15 mg daily. Medication is still helping with pain. OA affecting IP of hands and feet, knees, thoracic and lower back. Pain can be moderate to severe, depending of level of activity.  Osteopenia with high FRAX score. She is on Fosamax 70 mg weekly since 05/2019.  Review of Systems  Constitutional:  Negative for activity change, appetite change and fever.  HENT:  Negative for mouth sores, nosebleeds and sore throat.   Respiratory:  Negative for cough and wheezing.   Gastrointestinal:  Negative for abdominal pain, nausea and vomiting.        Negative for changes in bowel habits.  Genitourinary:  Negative for decreased urine volume and hematuria.  Musculoskeletal:  Positive for arthralgias and gait problem.  Neurological:  Negative for syncope and facial asymmetry.  Rest see pertinent positives and negatives per HPI.  Current Outpatient Medications on File Prior to Visit  Medication Sig Dispense Refill   alendronate (FOSAMAX) 70 MG tablet TAKE 1 TABLET BY MOUTH  WEEKLY WITH 8 OZ OF PLAIN  WATER 30 MINUTES BEFORE  FIRST FOOD, DRINK OR MEDS.  STAY UPRIGHT FOR 30 MINS 12 tablet 3   amLODipine (NORVASC) 2.5 MG tablet TAKE 1 TABLET(2.5 MG) BY MOUTH AT BEDTIME 90 tablet 1   Apoaequorin (PREVAGEN EXTRA STRENGTH) 20 MG CAPS Take 1 tablet by mouth daily.     b complex vitamins capsule Take 1 capsule by mouth daily.     Calcium-Phosphorus-Vitamin D [phone] MG-MG-UNIT CHEW Chew by mouth.     cetirizine (ZYRTEC) 10 MG tablet Take 10 mg by mouth at bedtime.     Cholecalciferol (VITAMIN D3) 50 MCG (2000 UT) TABS Take by mouth daily.     citalopram (CELEXA) 10 MG tablet TAKE 1 TABLET(10 MG) BY MOUTH DAILY 90 tablet 1   ferrous sulfate 325 (65 FE) MG tablet Take 325 mg by mouth daily with breakfast.     fluticasone (FLONASE) 50 MCG/ACT nasal spray SHAKE LIQUID AND USE 1 SPRAY IN EACH NOSTRIL TWICE DAILY AS NEEDED FOR ALLERGIES OR RHINITIS 16 g 11   glucosamine-chondroitin 500-400 MG tablet Take 1 tablet by mouth 2 (two) times daily.     losartan (COZAAR) 50 MG tablet TAKE 1 TABLET BY MOUTH  DAILY 90 tablet 3   meclizine (ANTIVERT) 25 MG tablet TAKE 1/2 TO 1 TABLET BY  MOUTH TWICE DAILY AS NEEDED FOR DIZZINESS 90 tablet 1   meloxicam (MOBIC) 15 MG tablet TAKE 1 TABLET BY MOUTH  DAILY 90 tablet 3   montelukast (SINGULAIR) 10 MG tablet TAKE 1 TABLET BY MOUTH AT  BEDTIME 90 tablet 3   Multiple Vitamin (MULTIVITAMIN WITH MINERALS) TABS tablet Take 1 tablet by mouth daily.     omeprazole (PRILOSEC) 20 MG capsule TAKE 1 CAPSULE BY MOUTH   DAILY 90 capsule 3   polyethylene glycol (MIRALAX / GLYCOLAX) 17 g packet Take 17 g by mouth daily.  pravastatin (PRAVACHOL) 20 MG tablet TAKE 1 TABLET BY MOUTH  DAILY 100 tablet 2   vitamin C (VITAMIN C) 1000 MG tablet Take 1 tablet (1,000 mg total) by mouth daily. 30 tablet 0   No current facility-administered medications on file prior to visit.    Past Medical History:  Diagnosis Date   Allergy    Anemia    Arthritis    GERD (gastroesophageal reflux disease)    Hyperlipidemia    Allergies  Allergen Reactions   Cymbalta [Duloxetine Hcl] Nausea Only    Adverse reaction caused dizziness and nausea   Elemental Sulfur Swelling and Other (See Comments)    Reaction:  All over body swelling    Penicillins Rash and Other (See Comments)    Has patient had a PCN reaction causing immediate rash, facial/tongue/throat swelling, SOB or lightheadedness with hypotension: No Has patient had a PCN reaction causing severe rash involving mucus membranes or skin necrosis: No Has patient had a PCN reaction that required hospitalization No Has patient had a PCN reaction occurring within the last 10 years: No If all of the above answers are "NO", then may proceed with Cephalosporin use.   Social History   Socioeconomic History   Marital status: Widowed    Spouse name: Not on file   Number of children: 3   Years of education: Not on file   Highest education level: Some college, no degree  Occupational History   Occupation: Surveyor, quantity: [employer]    Comment: works 25-30hrs/week  Tobacco Use   Smoking status: Former   Smokeless tobacco: Never  Scientific laboratory technician Use: Never used  Substance and Sexual Activity   Alcohol use: No   Drug use: No   Sexual activity: Not Currently    Comment: 1st intercourse 76 yo-Fewer than 5 partners  Other Topics Concern   Not on file  Social History Narrative   01/22/2019:   Widowed since 10/2017; lives alone now in [place] house with 2 dogs    Has one son, and one stepson and Psychiatrist. Sons live close by, supportive.   Has 5 grandchildren and several great grandchildren, active in their lives   Continues to work PT as Scientist, water quality, likes to stay busy   [employer] community support system   Social Determinants of Health   Financial Resource Strain: Low Risk  (07/22/2022)   Overall Financial Resource Strain (CARDIA)    Difficulty of Paying Living Expenses: Not hard at all  Food Insecurity: No Food Insecurity (07/22/2022)   Hunger Vital Sign    Worried About Running Out of Food in the Last Year: Never true    [place] in the Last Year: Never true  Transportation Needs: No Transportation Needs (07/22/2022)   PRAPARE - Hydrologist (Medical): No    Lack of Transportation (Non-Medical): No  Physical Activity: Insufficiently Active (07/22/2022)   Exercise Vital Sign    Days of Exercise per Week: 7 days    Minutes of Exercise per Session: 20 min  Stress: No Stress Concern Present (07/22/2022)   [place]    Feeling of Stress : Not at all  Social Connections: Moderately Integrated (07/22/2022)   Social Connection and Isolation Panel [NHANES]    Frequency of Communication with Friends and Family: More than three times a week    Frequency of Social Gatherings with Friends and Family: More than three  times a week    Attends Religious Services: More than 4 times per year    Active Member of Clubs or Organizations: Yes    Attends Archivist Meetings: More than 4 times per year    Marital Status: Widowed   Vitals:   08/13/22 1456  BP: 120/70  Pulse: 93  Resp: 12  Temp: 98.8 F (37.1 C)  SpO2: 97%   Body mass index is 26.88 kg/m.  Physical Exam Vitals and nursing note reviewed.  Constitutional:      General: She is not in acute distress.    Appearance: She is well-developed.  HENT:     Head: Normocephalic and atraumatic.      Mouth/Throat:     Mouth: Mucous membranes are moist.     Pharynx: Oropharynx is clear.  Eyes:     Conjunctiva/sclera: Conjunctivae normal.  Cardiovascular:     Rate and Rhythm: Normal rate and regular rhythm.     Pulses:          Dorsalis pedis pulses are 2+ on the right side and 2+ on the left side.     Heart sounds: No murmur heard. Pulmonary:     Effort: Pulmonary effort is normal. No respiratory distress.     Breath sounds: Normal breath sounds.  Abdominal:     Palpations: Abdomen is soft. There is no hepatomegaly or mass.     Tenderness: There is no abdominal tenderness.  Musculoskeletal:     Cervical back: No muscular tenderness.     Comments: + Kyphosis. Heberden's node and Bouchard's nodes.   Skin:    General: Skin is warm.     Findings: Ecchymosis (left-sided neck) present. No erythema or rash.       Neurological:     General: No focal deficit present.     Mental Status: She is alert and oriented to person, place, and time.     Cranial Nerves: No cranial nerve deficit.     Comments: Antalgic gait assisted with a cane.  Psychiatric:        Mood and Affect: Mood and affect normal.   ASSESSMENT AND PLAN:  Ms.Kahliyah was seen today for medication management and medicare wellness.  Diagnoses and all orders for this visit: Orders Placed This Encounter  Procedures   DG Bone Density   Flu Vaccine QUAD High Dose(Fluad)   Fall, subsequent encounter Fall precautions discussed. Head trauma, headache has improved, she was instructed about warning signs.  Generalized osteoarthritis of multiple sites Pain is otherwise well controlled. Continue Meloxicam 15 mg daily as needed and Tramadol 50 mg bid. Some side effects discussed.  Essential hypertension, benign BP adequately controlled. No changes in Losartan or Amlodipine dose. Continue low salt diet. Eye exam is current.  Chronic pain disorder PMP reviewed. Med contract renewed.  Mild recurrent major depression  ([place]) In remission. Continue celexa 10 mg daily.  Osteopenia Continue Fosamax 70 mg weekly, she has been on this med since 05/2019, planning on completing 5 years. Continue adequate Ca++ and vit D supplementation. Regular physical activity as tolerated. DEXA will be arranged.  Asymptomatic postmenopausal estrogen deficiency -     DG Bone Density; Future  Need for influenza vaccination -     Flu Vaccine QUAD High Dose(Fluad)  Return in about 6 months (around 02/11/2023).  Chianna Spirito G. [name], MD  [facility]. [place] office.

## 2022-08-12 ENCOUNTER — Ambulatory Visit: Payer: Medicare Other | Admitting: Physical Therapy

## 2022-08-13 ENCOUNTER — Ambulatory Visit (INDEPENDENT_AMBULATORY_CARE_PROVIDER_SITE_OTHER): Payer: Medicare Other | Admitting: Family Medicine

## 2022-08-13 ENCOUNTER — Encounter: Payer: Self-pay | Admitting: Family Medicine

## 2022-08-13 VITALS — BP 120/70 | HR 93 | Temp 98.8°F | Resp 12 | Ht 59.5 in | Wt 135.4 lb

## 2022-08-13 DIAGNOSIS — M810 Age-related osteoporosis without current pathological fracture: Secondary | ICD-10-CM | POA: Insufficient documentation

## 2022-08-13 DIAGNOSIS — Z780 Asymptomatic menopausal state: Secondary | ICD-10-CM | POA: Diagnosis not present

## 2022-08-13 DIAGNOSIS — W19XXXD Unspecified fall, subsequent encounter: Secondary | ICD-10-CM

## 2022-08-13 DIAGNOSIS — M8580 Other specified disorders of bone density and structure, unspecified site: Secondary | ICD-10-CM

## 2022-08-13 DIAGNOSIS — F330 Major depressive disorder, recurrent, mild: Secondary | ICD-10-CM

## 2022-08-13 DIAGNOSIS — M159 Polyosteoarthritis, unspecified: Secondary | ICD-10-CM

## 2022-08-13 DIAGNOSIS — I10 Essential (primary) hypertension: Secondary | ICD-10-CM | POA: Diagnosis not present

## 2022-08-13 DIAGNOSIS — Z23 Encounter for immunization: Secondary | ICD-10-CM

## 2022-08-13 DIAGNOSIS — G894 Chronic pain syndrome: Secondary | ICD-10-CM | POA: Diagnosis not present

## 2022-08-13 MED ORDER — TRAMADOL HCL 50 MG PO TABS: 50.0000 mg | ORAL_TABLET | Freq: Two times a day (BID) | ORAL | 3 refills | Status: DC | PRN

## 2022-08-13 NOTE — Patient Instructions (Addendum)
  Ms. Panther , Thank you for taking time to come for your Medicare Wellness Visit. I appreciate your ongoing commitment to your health goals. Please review the following plan we discussed and let me know if I can assist you in the future.   These are the goals we discussed:  Goals       LIFESTYLE - DECREASE FALLS RISK      Do not involve in risky activities that may increase the risk for falls.      Patient Stated      Patient stated (pt-stated)      Fall prevention.        This is a list of the screening recommended for you and due dates:  Health Maintenance  Topic Date Due   COVID-19 Vaccine (4 - Pfizer series) 12/29/2020   Zoster (Shingles) Vaccine (2 of 2) 01/20/2021   Flu Shot  06/15/2022   Tetanus Vaccine  08/04/2032   Pneumonia Vaccine  Completed   DEXA scan (bone density measurement)  Completed   Hepatitis C Screening: USPSTF Recommendation to screen - Ages 50-79 yo.  Completed   HPV Vaccine  Aged Out   Colon Cancer Screening  Discontinued   Generalized osteoarthritis of multiple sites - Plan: traMADol (ULTRAM) 50 MG tablet  Mild recurrent major depression ([place]), Chronic  Essential hypertension, benign  Chronic pain disorder - Plan: traMADol (ULTRAM) 50 MG tablet  Medicare annual wellness visit, subsequent  Asymptomatic postmenopausal estrogen deficiency - Plan: DG Bone Density  Osteopenia, unspecified location - Plan: DG Bone Density  Need for influenza vaccination - Plan: Flu Vaccine QUAD High Dose(Fluad)  No changes today.  If you need refills for medications you take chronically, please call your pharmacy. Do not use My Chart to request refills or for acute issues that need immediate attention. If you send a my chart message, it may take a few days to be addressed, specially if I am not in the office.  Please be sure medication list is accurate. If a new problem present, please set up appointment sooner than planned today.

## 2022-08-13 NOTE — Assessment & Plan Note (Signed)
In remission. Continue celexa 10 mg daily.

## 2022-08-13 NOTE — Assessment & Plan Note (Signed)
Pain is otherwise well controlled. Continue Meloxicam 15 mg daily as needed and Tramadol 50 mg bid. Some side effects discussed.

## 2022-08-13 NOTE — Assessment & Plan Note (Signed)
Continue Fosamax 70 mg weekly, she has been on this med since 05/2019, planning on completing 5 years. Continue adequate Ca++ and vit D supplementation. Regular physical activity as tolerated. DEXA will be arranged.

## 2022-08-13 NOTE — Assessment & Plan Note (Signed)
PMP reviewed. Med contract renewed.

## 2022-08-13 NOTE — Assessment & Plan Note (Signed)
BP adequately controlled. No changes in Losartan or Amlodipine dose. Continue low salt diet. Eye exam is current.

## 2022-08-16 ENCOUNTER — Other Ambulatory Visit: Payer: Self-pay | Admitting: Family Medicine

## 2022-08-16 DIAGNOSIS — I10 Essential (primary) hypertension: Secondary | ICD-10-CM

## 2022-08-16 DIAGNOSIS — J309 Allergic rhinitis, unspecified: Secondary | ICD-10-CM

## 2022-08-16 DIAGNOSIS — K219 Gastro-esophageal reflux disease without esophagitis: Secondary | ICD-10-CM

## 2022-08-16 NOTE — Therapy (Signed)
OUTPATIENT PHYSICAL THERAPY NEURO TREATMENT     Patient Name: Tabitha Adams MRN: [id number] DOB:December 06, 1945, 76 y.o., female Today's Date: 08/17/2022  PCP: [name], [name], MD REFERRING PROVIDER: Eulas Post, MD    PT End of Session - 08/17/22 1524     Visit Number 7    Number of Visits 13    Date for PT Re-Evaluation 08/24/22    Authorization Type U[employer] Medicare 2023    PT Start Time 1452   pt late   PT Stop Time 1530    PT Time Calculation (min) 38 min    Equipment Utilized During Treatment Gait belt    Activity Tolerance Patient tolerated treatment well    Behavior During Therapy WFL for tasks assessed/performed                 Past Medical History:  Diagnosis Date   Allergy    Anemia    Arthritis    GERD (gastroesophageal reflux disease)    Hyperlipidemia    Past Surgical History:  Procedure Laterality Date   APPENDECTOMY     BACK SURGERY     BREAST CYST ASPIRATION Left    BREAST EXCISIONAL BIOPSY Left    BREAST EXCISIONAL BIOPSY Left    CATARACT EXTRACTION, BILATERAL     ORIF WRIST FRACTURE Right 01/25/2017   Procedure: OPEN REDUCTION INTERNAL FIXATION (ORIF) WRIST FRACTURE;  Surgeon: Roseanne Kaufman, MD;  Location: [facility];  Service: Orthopedics;  Laterality: Right;   TOTAL ABDOMINAL HYSTERECTOMY     heavy periods   Patient Active Problem List   Diagnosis Date Noted   Osteopenia 08/13/2022   Scoliosis 06/28/2022   Prediabetes 08/28/2021   Vaginal vault prolapse 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 03/03/2020   Cystocele, midline 03/03/2020   Benign essential tremor 07/24/2018   Mild recurrent major depression ([place]) 03/22/2018   Benign paroxysmal positional vertigo due to bilateral vestibular disorder 09/27/2017   Barton's fracture of right radius 01/25/2017   Essential hypertension, benign 10/04/2016   Generalized osteoarthritis of multiple sites 08/31/2016   Chronic back pain 07/13/2016   Hyperlipidemia 07/13/2016   Chronic allergic rhinitis  07/13/2016   GERD (gastroesophageal reflux disease) 07/13/2016   Chronic pain disorder 07/13/2016    ONSET DATE: past 2 months  REFERRING DIAG: Burchette, Alinda Sierras, MD  THERAPY DIAG:  Unsteadiness on feet  Other abnormalities of gait and mobility  Muscle weakness (generalized)  Abnormal posture  Rationale for Evaluation and Treatment Rehabilitation  SUBJECTIVE:   SUBJECTIVE STATEMENT: Had a fall while in [place]- fell out of the bathtub twice. Reports that it was a tub shower without a grab bar whereas she has a step in shower at home with a grab bar. Denies HA, dizziness, or other symptoms. Also on Saturday she stubbed her R thumb and bruised it up.   Pt accompanied by: self  PERTINENT HISTORY: anemia, GERD, HLD, back surgery, R wrist ORIF   PAIN:  Are you having pain? No  PRECAUTIONS: Fall  PATIENT GOALS "get to where my balance is better"  OBJECTIVE:     TODAY'S TREATMENT: 08/17/22 Activity Comments  lateral and forward stepping over hurdle 10x each Required 1-2 fingertip support with sidestepping; no UE support with forward steps   stepping over aerobic step with walking poles to simulate curb CGA and cueing for proper sequencing; good stability   Step ups/downs on 1st 2 6" steps  Practicing proper sequencing with walking poles; good stability  staggered stance straight arm pulldown 2x20# Cues for  upright posture   staggered stance paloff press 10x each with 5# +2# dumbbell Verbal.manual cues to maintain upright posture  Gait training outside on grass with 2 walking poles 150f L UE lagging behind and intermittently losing proper AD sequencing       PATIENT EDUCATION: Education details: encouraged installing handrail for stairs and provided handout on walking pole sequencing with stairs Person educated: Patient Education method: Explanation, Demonstration, Tactile cues, Verbal cues, and Handouts Education comprehension: verbalized understanding and returned  demonstration    HOME EXERCISE PROGRAM Last updated: 07/22/22 Access Code: [email or web address]: [email or web address] Date: 07/22/2022 Prepared by: [facility]  Exercises - Sit to Stand Without Arm Support  - 1 x daily - 5 x weekly - 2 sets - 10 reps - Standing Hip Abduction with Counter Support  - 1 x daily - 5 x weekly - 2 sets - 10 reps - Heel Raises with Counter Support  - 1 x daily - 5 x weekly - 2 sets - 10 reps - Gastroc Stretch on Wall  - 1 x daily - 5 x weekly - 2 sets - 30 sec hold - Forward Backward Weight Shift with Counter Support  - 1 x daily - 5 x weekly - 2 sets - 10 reps - Staggered Stance Forward Backward Weight Shift with Unilateral Counter Support  - 1 x daily - 5 x weekly - 2 sets - 10 reps - Lateral Shift Correction at Wall  - 1 x daily - 5 x weekly - 2 sets - 10 reps - Seated Thoracic Flexion and Rotation with Swiss Ball  - 1 x daily - 5 x weekly - 2 sets - 10 reps - Standing Shoulder Row with Anchored Resistance  - 1 x daily - 5 x weekly - 2 sets - 10 reps - Shoulder extension with resistance - Neutral  - 1 x daily - 5 x weekly - 2 sets - 10 reps   Below measures were taken at time of initial evaluation unless otherwise specified:   DIAGNOSTIC FINDINGS: none recent  COGNITION: Overall cognitive status: Within functional limits for tasks assessed   SENSATION: WFL  POSTURE: rounded shoulders, forward head, increased thoracic kyphosis, and thoracic R concave scoliotic curve with R shoulder depressed  AROM:  R ankle dorsiflexion: 7 deg L ankle dorsiflexion: 0 deg  LOWER EXTREMITY MMT:   MMT (in sitting) Right eval Left eval  Hip flexion 5 4  Hip abduction 4- 4-  Hip adduction 4+ 4+  Hip internal rotation    Hip external rotation    Knee flexion 5 4  Knee extension 5 5  Ankle dorsiflexion 4+ 4+  Ankle plantarflexion 4 4  Ankle inversion    Ankle eversion    (Blank rows = not tested)  GAIT: Gait  pattern:  narrow BOS with hip instability and decreased R step length; trunk flexed   Assistive device utilized: None Level of assistance: Modified independence   FUNCTIONAL TESTs:  Berg: 47/56     HEP:  Access Code: [email or web address]: [email or web address] Date: 07/13/2022 [facility]ared by: [facility]  Exercises - Sit to Stand Without Arm Support  - 1 x daily - 5 x weekly - 2 sets - 10 reps - Standing Hip Abduction with Counter Support  - 1 x daily - 5 x weekly - 2 sets - 10 reps - Heel Raises with Counter Support  - 1 x daily -  5 x weekly - 2 sets - 10 reps - Gastroc Stretch on Wall  - 1 x daily - 5 x weekly - 2 sets - 30 sec hold - Forward Backward Weight Shift with Counter Support  - 1 x daily - 5 x weekly - 2 sets - 10 reps   PATIENT EDUCATION: Education details: prognosis, POC, HEP Person educated: Patient Education method: Explanation, Demonstration, Tactile cues, Verbal cues, and Handouts Education comprehension: verbalized understanding and returned demonstration   GOALS: Goals reviewed with patient? Yes  SHORT TERM GOALS: Target date: 08/03/2022  Patient to be independent with initial HEP. Baseline: HEP initiated Goal status: MET    LONG TERM GOALS: Target date: 08/24/2022  Patient to be independent with advanced HEP. Baseline: Not yet initiated  Goal status: IN PROGRESS  Patient to demonstrate B LE strength >/=4+/5.  Baseline: See above Goal status: IN PROGRESS  Patient to demonstrate B ankle dorsiflexion AROM to 10 degrees in order to improve safety with ambulation.  Baseline: 0 and 7 deg Goal status: IN PROGRESS  Patient to complete TUG in <14 sec with LRAD in order to decrease risk of falls.   Baseline: 15.43 sec Goal status: IN PROGRESS  Patient to demonstrate 5xSTS test in <15 sec in order to decrease risk of falls.  Baseline: 16.38 sec Goal status: IN PROGRESS  Patient to score at least 50/56  on Berg in order to decrease risk of falls.  Baseline: 47 Goal status: IN PROGRESS   ASSESSMENT:  CLINICAL IMPRESSION: Patient arrived to session with report of experiencing a fall while in [place] as she was stepping out of a tub without a grab bar. Patient hit her head but denies HA, dizziness, or other symptoms. Also reports jamming her R thumb on Saturday- today with bruising visible over R thumb and L lateral neck. Worked on sequencing with stairs and simulating curbs with walking poles- wrote this down for patient. Patient still with inconsistent AD sequencing, requiring cues and breaks to reset. Tolerated session well and without complaints upon leaving.      OBJECTIVE IMPAIRMENTS Abnormal gait, decreased balance, decreased ROM, decreased strength, dizziness, increased muscle spasms, improper body mechanics, postural dysfunction, and pain.   ACTIVITY LIMITATIONS carrying, lifting, bending, standing, stairs, transfers, bathing, and dressing  PARTICIPATION LIMITATIONS: meal prep, cleaning, laundry, driving, shopping, community activity, occupation, yard work, and church  PERSONAL FACTORS Age, Past/current experiences, Profession, Time since onset of injury/illness/exacerbation, and 3+ comorbidities: anemia, GERD, HLD, back surgery, R wrist ORIF  are also affecting patient's functional outcome.   REHAB POTENTIAL: Good  CLINICAL DECISION MAKING: Evolving/moderate complexity  EVALUATION COMPLEXITY: Moderate   PLAN: PT FREQUENCY: 2x/week  PT DURATION: 6 weeks  PLANNED INTERVENTIONS: Therapeutic exercises, Therapeutic activity, Neuromuscular re-education, Balance training, Gait training, Patient/Family education, Self Care, Joint mobilization, Stair training, Vestibular training, Canalith repositioning, Aquatic Therapy, Dry Needling, Electrical stimulation, Cryotherapy, Moist heat, Taping, Manual therapy, and Re-evaluation  PLAN FOR NEXT SESSION: gait train with B poles; work on  weight shifting activities and posterior stepping to address posterior LOB, turns   Janene Harvey, PT, DPT 08/17/22 3:33 [place] at [facility] [address], [place], [place] [address] Phone # [phone] Fax # [phone]

## 2022-08-17 ENCOUNTER — Encounter: Payer: Self-pay | Admitting: Physical Therapy

## 2022-08-17 ENCOUNTER — Ambulatory Visit: Payer: Medicare Other | Attending: Family Medicine | Admitting: Physical Therapy

## 2022-08-17 DIAGNOSIS — R2689 Other abnormalities of gait and mobility: Secondary | ICD-10-CM | POA: Diagnosis not present

## 2022-08-17 DIAGNOSIS — M6281 Muscle weakness (generalized): Secondary | ICD-10-CM | POA: Insufficient documentation

## 2022-08-17 DIAGNOSIS — R2681 Unsteadiness on feet: Secondary | ICD-10-CM | POA: Diagnosis not present

## 2022-08-17 DIAGNOSIS — R293 Abnormal posture: Secondary | ICD-10-CM | POA: Diagnosis not present

## 2022-08-18 NOTE — Therapy (Signed)
[facility] DISCHARGE SUMMARY     Patient Name: Tabitha Adams MRN: [id number] DOB:1945-12-25, 76 y.o., female Today's Date: 08/19/2022  Progress Note Reporting Period 07/13/22 to 08/19/22  See note below for Objective Data and Assessment of Progress/Goals.     PCP: [name], [name], MD REFERRING PROVIDER: Eulas Post, MD    PT End of Session - 08/19/22 1533     Visit Number 8    Number of Visits 13    Date for PT Re-Evaluation 08/24/22    Authorization Type [employer] Medicare 2023    PT Start Time 1452   pt late   PT Stop Time 1528    PT Time Calculation (min) 36 min    Equipment Utilized During Treatment Gait belt    Activity Tolerance Patient tolerated treatment well    Behavior During Therapy WFL for tasks assessed/performed                  Past Medical History:  Diagnosis Date   Allergy    Anemia    Arthritis    GERD (gastroesophageal reflux disease)    Hyperlipidemia    Past Surgical History:  Procedure Laterality Date   APPENDECTOMY     BACK SURGERY     BREAST CYST ASPIRATION Left    BREAST EXCISIONAL BIOPSY Left    BREAST EXCISIONAL BIOPSY Left    CATARACT EXTRACTION, BILATERAL     ORIF WRIST FRACTURE Right 01/25/2017   Procedure: OPEN REDUCTION INTERNAL FIXATION (ORIF) WRIST FRACTURE;  Surgeon: Roseanne Kaufman, MD;  Location: [facility];  Service: Orthopedics;  Laterality: Right;   TOTAL ABDOMINAL HYSTERECTOMY     heavy periods   Patient Active Problem List   Diagnosis Date Noted   Osteopenia 08/13/2022   Scoliosis 06/28/2022   Prediabetes 08/28/2021   Vaginal vault prolapse 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 03/03/2020   Cystocele, midline 03/03/2020   Benign essential tremor 07/24/2018   Mild recurrent major depression ([place]) 03/22/2018   Benign paroxysmal positional vertigo due to bilateral vestibular disorder 09/27/2017   Barton's fracture of right radius 01/25/2017   Essential hypertension, benign 10/04/2016   Generalized  osteoarthritis of multiple sites 08/31/2016   Chronic back pain 07/13/2016   Hyperlipidemia 07/13/2016   Chronic allergic rhinitis 07/13/2016   GERD (gastroesophageal reflux disease) 07/13/2016   Chronic pain disorder 07/13/2016    ONSET DATE: past 2 months  REFERRING DIAG: Burchette, Alinda Sierras, MD  THERAPY DIAG:  Unsteadiness on feet  Other abnormalities of gait and mobility  Muscle weakness (generalized)  Abnormal posture  Rationale for Evaluation and Treatment Rehabilitation  SUBJECTIVE:   SUBJECTIVE STATEMENT: Nothing new. Trying not to fall.   Pt accompanied by: self  PERTINENT HISTORY: anemia, GERD, HLD, back surgery, R wrist ORIF   PAIN:  Are you having pain? Yes: NPRS scale: 7-8/10 Pain location: midback Pain description: aching Aggravating factors: standing Relieving factors: rest  PRECAUTIONS: Fall  PATIENT GOALS "get to where my balance is better"  OBJECTIVE:     TODAY'S TREATMENT: 08/19/22 Activity Comments  Ankle DF AROM R 18 deg, L 16 deg  TUG with walking poles 17.19 sec  5xSTS without Ues  16.1 sec             LOWER EXTREMITY MMT:   MMT (in sitting) Right eval Left eval Right 08/19/22 Left 08/19/22  Hip flexion 5 4 5 5   Hip abduction 4- 4- 4+ 4+  Hip adduction 4+ 4+ 4+ 4+  Hip internal rotation  Hip external rotation      Knee flexion 5 4 4+ 4  Knee extension 5 5 5 5   Ankle dorsiflexion 4+ 4+ 4+ 4  Ankle plantarflexion 4 4 4+ 4  Ankle inversion      Ankle eversion      (Blank rows = not tested)   HOME EXERCISE PROGRAM Last updated: 08/19/22 Access Code: [email or web address] URL: [email or web address] Date: 08/19/2022 Prepared by: [facility]  Exercises - Standing Hip Abduction with Counter Support  - 1 x daily - 5 x weekly - 2 sets - 10 reps - Heel Raises with Counter Support  - 1 x daily - 5 x weekly - 2 sets - 10 reps - Staggered Stance Forward Backward Weight Shift with  Unilateral Counter Support  - 1 x daily - 5 x weekly - 2 sets - 10 reps - Standing Shoulder Row with Anchored Resistance  - 1 x daily - 5 x weekly - 2 sets - 10 reps - Shoulder extension with resistance - Neutral  - 1 x daily - 5 x weekly - 2 sets - 10 reps - Standing Hamstring Curl with Resistance  - 1 x daily - 5 x weekly - 2 sets - 10 reps - Standing Single Leg Stance with Counter Support  - 1 x daily - 5 x weekly - 2 sets - 10 reps - Standing Toe Taps  - 1 x daily - 5 x weekly - 2 sets - 10 reps   PATIENT EDUCATION: Education details: HEP update; discussion on objective progress and remaining impairments Person educated: Patient Education method: Explanation, Demonstration, Tactile cues, Verbal cues, and Handouts Education comprehension: verbalized understanding    Below measures were taken at time of initial evaluation unless otherwise specified:   DIAGNOSTIC FINDINGS: none recent  COGNITION: Overall cognitive status: Within functional limits for tasks assessed   SENSATION: WFL  POSTURE: rounded shoulders, forward head, increased thoracic kyphosis, and thoracic R concave scoliotic curve with R shoulder depressed  AROM:  R ankle dorsiflexion: 7 deg L ankle dorsiflexion: 0 deg  LOWER EXTREMITY MMT:   MMT (in sitting) Right eval Left eval  Hip flexion 5 4  Hip abduction 4- 4-  Hip adduction 4+ 4+  Hip internal rotation    Hip external rotation    Knee flexion 5 4  Knee extension 5 5  Ankle dorsiflexion 4+ 4+  Ankle plantarflexion 4 4  Ankle inversion    Ankle eversion    (Blank rows = not tested)  GAIT: Gait pattern:  narrow BOS with hip instability and decreased R step length; trunk flexed   Assistive device utilized: None Level of assistance: Modified independence   FUNCTIONAL TESTs:  Berg: 47/56  [facility] PT Assessment - 08/19/22 0001       Standardized Balance Assessment   Five times sit to stand comments  16.1 sec without UEs      Berg Balance Test    Sit to Stand Able to stand without using hands and stabilize independently    Standing Unsupported Able to stand safely 2 minutes    Sitting with Back Unsupported but Feet Supported on Floor or Stool Able to sit safely and securely 2 minutes    Stand to Sit Sits safely with minimal use of hands    Transfers Able to transfer safely, minor use of hands    Standing Unsupported with Eyes Closed Able to stand 10 seconds safely    Standing Unsupported  with Feet Together Able to place feet together independently and stand 1 minute safely    From Standing, Reach Forward with Outstretched Arm Can reach confidently >25 cm (10")    From Standing Position, Pick up Object from [place] to pick up shoe safely and easily    From Standing Position, Turn to Look Behind Over each Shoulder Looks behind from both sides and weight shifts well    Turn 360 Degrees Able to turn 360 degrees safely in 4 seconds or less    Standing Unsupported, Alternately Place Feet on Step/Stool Able to stand independently and safely and complete 8 steps in 20 seconds    Standing Unsupported, One Foot in [place] to place foot tandem independently and hold 30 seconds    Standing on One Leg Able to lift leg independently and hold > 10 seconds    Total Score 56      Timed Up and Go Test   Normal TUG (seconds) 17.19   with walking poles             [facility] PT Assessment - 08/19/22 0001       Standardized Balance Assessment   Five times sit to stand comments  16.1 sec without UEs      Berg Balance Test   Sit to Stand Able to stand without using hands and stabilize independently    Standing Unsupported Able to stand safely 2 minutes    Sitting with Back Unsupported but Feet Supported on Floor or Stool Able to sit safely and securely 2 minutes    Stand to Sit Sits safely with minimal use of hands    Transfers Able to transfer safely, minor use of hands    Standing Unsupported with Eyes Closed Able to stand 10 seconds  safely    Standing Unsupported with Feet Together Able to place feet together independently and stand 1 minute safely    From Standing, Reach Forward with Outstretched Arm Can reach confidently >25 cm (10")    From Standing Position, Pick up Object from Floor Able to pick up shoe safely and easily    From Standing Position, Turn to Look Behind Over each Shoulder Looks behind from both sides and weight shifts well    Turn 360 Degrees Able to turn 360 degrees safely in 4 seconds or less    Standing Unsupported, Alternately Place Feet on Step/Stool Able to stand independently and safely and complete 8 steps in 20 seconds    Standing Unsupported, One Foot in Front Able to place foot tandem independently and hold 30 seconds    Standing on One Leg Able to lift leg independently and hold > 10 seconds    Total Score 56      Timed Up and Go Test   Normal TUG (seconds) 17.19   with walking poles            HEP:  Access Code: [email or web address] URL: [email or web address] Date: 07/13/2022 Prepared by: [facility]  Exercises - Sit to Stand Without Arm Support  - 1 x daily - 5 x weekly - 2 sets - 10 reps - Standing Hip Abduction with Counter Support  - 1 x daily - 5 x weekly - 2 sets - 10 reps - Heel Raises with Counter Support  - 1 x daily - 5 x weekly - 2 sets - 10 reps - Gastroc Stretch on Wall  - 1 x daily - 5 x weekly -  2 sets - 30 sec hold - Forward Backward Weight Shift with Counter Support  - 1 x daily - 5 x weekly - 2 sets - 10 reps   PATIENT EDUCATION: Education details: prognosis, POC, HEP Person educated: Patient Education method: Explanation, Demonstration, Tactile cues, Verbal cues, and Handouts Education comprehension: verbalized understanding and returned demonstration   GOALS: Goals reviewed with patient? Yes  SHORT TERM GOALS: Target date: 08/03/2022  Patient to be independent with initial HEP. Baseline: HEP initiated Goal  status: MET    LONG TERM GOALS: Target date: 08/24/2022  Patient to be independent with advanced HEP. Baseline: Not yet initiated  Goal status: MET 08/19/22  Patient to demonstrate B LE strength >/=4+/5.  Baseline: See above Goal status: PARTIALLY MET 08/19/22  Patient to demonstrate B ankle dorsiflexion AROM to 10 degrees in order to improve safety with ambulation.  Baseline: 0 and 7 deg Goal status: MET 08/19/22  Patient to complete TUG in <14 sec with LRAD in order to decrease risk of falls.   Baseline: 15.43 sec; 17.19 sec 08/19/22 Goal status: NOT MET 08/19/22  Patient to demonstrate 5xSTS test in <15 sec in order to decrease risk of falls.  Baseline: 16.38 sec; 16.1 sec 08/19/22 Goal status: NOT MET 08/19/22  Patient to score at least 50/56 on Berg in order to decrease risk of falls.  Baseline: 47, 56 08/19/22 Goal status: MET 08/19/22   ASSESSMENT:  CLINICAL IMPRESSION: Patient arrived to session without new complaints. Strength testing revealed improvement in L hip flexion, B hip abduction, and R ankle PF. Remaining weakness evident in L knee flexion, and L ankle DF/PF. B ankle AROM is now [facility]. Patient's score on 5xSTS has improved slightly and patient now able to perform without UE support. TUG time increased d/t using B walking poles, however patient does appear more stable using them. Patient scored 56/56 on Berg, indicating a decreased risk of falls. At this time, patient has met or made progress towards most goals. Updated HEP to address remaining impairments. Patient is now ready for DC with transition to HEP.     OBJECTIVE IMPAIRMENTS Abnormal gait, decreased balance, decreased ROM, decreased strength, dizziness, increased muscle spasms, improper body mechanics, postural dysfunction, and pain.   ACTIVITY LIMITATIONS carrying, lifting, bending, standing, stairs, transfers, bathing, and dressing  PARTICIPATION LIMITATIONS: meal prep, cleaning, laundry,  driving, shopping, community activity, occupation, yard work, and church  PERSONAL FACTORS Age, Past/current experiences, Profession, Time since onset of injury/illness/exacerbation, and 3+ comorbidities: anemia, GERD, HLD, back surgery, R wrist ORIF  are also affecting patient's functional outcome.   REHAB POTENTIAL: Good  CLINICAL DECISION MAKING: Evolving/moderate complexity  EVALUATION COMPLEXITY: Moderate   PLAN: PT FREQUENCY: 2x/week  PT DURATION: 6 weeks  PLANNED INTERVENTIONS: Therapeutic exercises, Therapeutic activity, Neuromuscular re-education, Balance training, Gait training, Patient/Family education, Self Care, Joint mobilization, Stair training, Vestibular training, Canalith repositioning, Aquatic Therapy, Dry Needling, Electrical stimulation, Cryotherapy, Moist heat, Taping, Manual therapy, and Re-evaluation  PLAN FOR NEXT SESSION: DC at this time   PHYSICAL THERAPY DISCHARGE SUMMARY  Visits from Start of Care: 8  Current functional level related to goals / functional outcomes: See above clinical impression   Remaining deficits: Decreased gait and transfer speed    Education / Equipment: HEP  Plan: Patient agrees to discharge.  Patient goals were partially met. Patient is being discharged due to meeting the stated rehab goals.         Janene Harvey, PT, DPT 08/19/22 3:34 PM  [facility] at [facility], [place], [place] [address] Phone # [phone] Fax # [phone]

## 2022-08-19 ENCOUNTER — Ambulatory Visit: Payer: Medicare Other | Admitting: Physical Therapy

## 2022-08-19 DIAGNOSIS — M6281 Muscle weakness (generalized): Secondary | ICD-10-CM | POA: Diagnosis not present

## 2022-08-19 DIAGNOSIS — R293 Abnormal posture: Secondary | ICD-10-CM | POA: Diagnosis not present

## 2022-08-19 DIAGNOSIS — R2681 Unsteadiness on feet: Secondary | ICD-10-CM | POA: Diagnosis not present

## 2022-08-19 DIAGNOSIS — R2689 Other abnormalities of gait and mobility: Secondary | ICD-10-CM | POA: Diagnosis not present

## 2022-09-13 ENCOUNTER — Other Ambulatory Visit: Payer: Self-pay | Admitting: Family Medicine

## 2022-09-22 DIAGNOSIS — M5136 Other intervertebral disc degeneration, lumbar region: Secondary | ICD-10-CM | POA: Diagnosis not present

## 2022-09-22 DIAGNOSIS — M9903 Segmental and somatic dysfunction of lumbar region: Secondary | ICD-10-CM | POA: Diagnosis not present

## 2022-09-27 ENCOUNTER — Encounter: Payer: Self-pay | Admitting: Family Medicine

## 2022-09-27 ENCOUNTER — Ambulatory Visit (INDEPENDENT_AMBULATORY_CARE_PROVIDER_SITE_OTHER): Payer: Medicare Other | Admitting: Family Medicine

## 2022-09-27 VITALS — BP 120/62 | HR 89 | Temp 98.7°F | Resp 16 | Ht 59.5 in | Wt 136.4 lb

## 2022-09-27 DIAGNOSIS — L659 Nonscarring hair loss, unspecified: Secondary | ICD-10-CM

## 2022-09-27 DIAGNOSIS — H8113 Benign paroxysmal vertigo, bilateral: Secondary | ICD-10-CM | POA: Diagnosis not present

## 2022-09-27 DIAGNOSIS — R202 Paresthesia of skin: Secondary | ICD-10-CM | POA: Diagnosis not present

## 2022-09-27 MED ORDER — CLOBETASOL PROPIONATE 0.05 % EX CREA: TOPICAL_CREAM | CUTANEOUS | 0 refills | Status: AC

## 2022-09-27 NOTE — Patient Instructions (Addendum)
A few things to remember from today's visit:   Alopecia - Plan: clobetasol cream (TEMOVATE) 0.05 %  Tingling sensation Apply Clobetasone 2 times daily on affected hair loss areas for 14 days then daily until you see dermatologist. In regard to tingling, it doe snot seem considering, monitor for new symptoms.  If you need refills for medications you take chronically, please call your pharmacy. Do not use My Chart to request refills or for acute issues that need immediate attention. If you send a my chart message, it may take a few days to be addressed, specially if I am not in the office.  Please be sure medication list is accurate. If a new problem present, please set up appointment sooner than planned today.   Alopecia Areata, Adult  Alopecia areata is a condition that causes hair loss. A person with this condition may lose hair on the scalp in patches. In some cases, a person may lose all the hair on the scalp or all the hair from the face and body. Having this condition can be emotionally difficult, but it is not dangerous. Alopecia areata is an autoimmune disease. This means that your body's defense system (immune system) mistakes normal parts of the body for germs or other things that can make you sick. When you have alopecia areata, the immune system attacks the hair follicles. What are the causes? The cause of this condition is not known. What increases the risk? You are more likely to develop this condition if you have: A family history of alopecia. A family history of another autoimmune disease, including type 1 diabetes and thyroid autoimmune disease. Eczema, asthma, and allergies. Down syndrome. What are the signs or symptoms? The main symptom of this condition is round spots of patchy hair loss on the scalp. The spots may be mildly itchy. Other symptoms include: Short dark hairs in the bald patches that are wider at the top (exclamation point hairs). Dents, white spots, or  lines in the fingernails or toenails. Balding and body hair loss. This is rare. Alopecia areata usually develops in childhood, but it can develop at any age. For some people, their hair grows back on its own and hair loss does not happen again. For others, their hair may fall out and grow back in cycles. The hair loss may last many years. How is this diagnosed? This condition is diagnosed based on your symptoms and family history. Your health care provider will also check your scalp skin, teeth, and nails. Your health care provider may refer you to a specialist in hair and skin disorders (dermatologist). You may also have tests, including: A hair pull test. Blood tests or other screening tests to check for autoimmune diseases, such as thyroid disease or diabetes. Skin biopsy to confirm the diagnosis. A procedure to examine the skin with a lighted magnifying instrument (dermoscopy). How is this treated? There is no cure for alopecia areata. The goals of treatment are to promote the regrowth of hair and prevent the immune system from overreacting. No single treatment is right for all people with alopecia areata. It depends on the type of hair loss you have and how severe it is. Work with your health care provider to find the best treatment for you. Treatment may include: Regular checkups to make sure the condition is not getting worse . This is called watchful waiting. Using steroid creams or pills for 6-8 weeks to stop the immune reaction and help hair to regrow more quickly. Using other  medicines on your skin (topical medicines) to change the immune system response and support the hair growth cycle. Steroid injections. Therapy and counseling with a support group or therapist if you are having trouble coping with hair loss. Follow these instructions at home: Medicines Apply topical creams only as told by your health care provider. Take over-the-counter and prescription medicines only as told by  your health care provider. General instructions Learn as much as you can about your condition. Consider getting a wig or products to make hair look fuller or to cover bald spots, if you feel uncomfortable with your appearance. Get therapy or counseling if you are having a hard time coping with hair loss. Ask your health care provider to recommend a counselor or support group. Keep all follow-up visits as told by your health care provider. This is important. Where to find more information [employer] [email or web address] Contact a health care provider if: Your hair loss gets worse, even with treatment. You have new symptoms. You are struggling emotionally. Get help right away if: You have a sudden worsening of the hair loss. Summary Alopecia areata is an autoimmune condition that makes your body's defense system (immune system) attack the hair follicles. This causes you to lose hair. Having this condition can be emotionally difficult, but it is not dangerous. Treatments may include regular checkups to make sure that the condition is not getting worse, medicines, and steroid injections. This information is not intended to replace advice given to you by your health care provider. Make sure you discuss any questions you have with your health care provider. Document Revised: 01/15/2020 Document Reviewed: 01/15/2020 Elsevier Patient Education  [place].

## 2022-09-27 NOTE — Progress Notes (Signed)
ACUTE VISIT Chief Complaint  Patient presents with   Alopecia    Back of head, noticed last week.    HPI: Tabitha Adams is a 76 y.o. female, who is here today complaining of worsening hair loss. She was seen on 12/07/21 with same concerned.   Hair loss, specifically around occipital area. She reports that her granddaughter first noticed the issue approximately one week ago, and her hairdresser also mentioned it a month prior.  Blood work with no significant abnormalities, except mild anemia.  She has been taking biotin and  iron supplements. Lab Results  Component Value Date   TSH 0.84 12/07/2021   Lab Results  Component Value Date   CRP <1.0 12/07/2021   Lab Results  Component Value Date   WBC 6.1 12/07/2021   HGB 11.3 (L) 12/07/2021   HCT 33.1 (L) 12/07/2021   MCV 84.3 12/07/2021   PLT 317.0 12/07/2021   Seen a few weeks ago for balance issues and fall,06/28/22, she was recommended stopping meclizine,which she has taken for a while for vertigo. She inquires if the discontinuation of meclizine could be related to her hair loss.  She has not noted scalp skin lesions, occasional pruritus.  Additionally, the patient reports occasional tingling sensation left side of her neck,started after fall in 04/2022. At this time she has head and cervical CT. She described sensation similar to a spider crawling down her neck. She denies any tingling sensations in her arms or associated weakness, no rash. Cervical PY:PPJKDTOIZT degenerative change without acute abnormality. Head CT: Mild chronic white matter ischemic changes.No acute intracranial abnormality is noted.  Review of Systems  Constitutional:  Positive for fatigue. Negative for activity change, appetite change and fever.  HENT:  Negative for mouth sores and sore throat.   Respiratory:  Negative for cough, shortness of breath and wheezing.   Cardiovascular:  Negative for chest pain and palpitations.  Gastrointestinal:   Negative for abdominal pain, nausea and vomiting.  Neurological:  Negative for syncope, facial asymmetry and headaches.  Rest see pertinent positives and negatives per HPI.  Current Outpatient Medications on File Prior to Visit  Medication Sig Dispense Refill   alendronate (FOSAMAX) 70 MG tablet TAKE 1 TABLET BY MOUTH WEEKLY  WITH 8 OZ OF PLAIN WATER 30  MINUTES BEFORE FIRST FOOD, DRINK OR MEDS. STAY UPRIGHT FOR 30  MINS 12 tablet 3   amLODipine (NORVASC) 2.5 MG tablet TAKE 1 TABLET(2.5 MG) BY MOUTH AT BEDTIME 90 tablet 1   Apoaequorin (PREVAGEN EXTRA STRENGTH) 20 MG CAPS Take 1 tablet by mouth daily.     b complex vitamins capsule Take 1 capsule by mouth daily.     Calcium-Phosphorus-Vitamin D [phone] MG-MG-UNIT CHEW Chew by mouth.     cetirizine (ZYRTEC) 10 MG tablet Take 10 mg by mouth at bedtime.     Cholecalciferol (VITAMIN D3) 50 MCG (2000 UT) TABS Take by mouth daily.     citalopram (CELEXA) 10 MG tablet TAKE 1 TABLET(10 MG) BY MOUTH DAILY 90 tablet 1   ferrous sulfate 325 (65 FE) MG tablet Take 325 mg by mouth daily with breakfast.     fluticasone (FLONASE) 50 MCG/ACT nasal spray SHAKE LIQUID AND USE 1 SPRAY IN EACH NOSTRIL TWICE DAILY AS NEEDED FOR ALLERGIES OR RHINITIS 16 g 11   glucosamine-chondroitin 500-400 MG tablet Take 1 tablet by mouth 2 (two) times daily.     losartan (COZAAR) 50 MG tablet TAKE 1 TABLET BY MOUTH DAILY 100 tablet 2  meclizine (ANTIVERT) 25 MG tablet TAKE 1/2 TO 1 TABLET BY  MOUTH TWICE DAILY AS NEEDED FOR DIZZINESS 90 tablet 1   meloxicam (MOBIC) 15 MG tablet TAKE 1 TABLET BY MOUTH DAILY 100 tablet 2   montelukast (SINGULAIR) 10 MG tablet TAKE 1 TABLET BY MOUTH AT  BEDTIME 100 tablet 2   Multiple Vitamin (MULTIVITAMIN WITH MINERALS) TABS tablet Take 1 tablet by mouth daily.     omeprazole (PRILOSEC) 20 MG capsule TAKE 1 CAPSULE BY MOUTH DAILY 100 capsule 2   polyethylene glycol (MIRALAX / GLYCOLAX) 17 g packet Take 17 g by mouth daily.     pravastatin  (PRAVACHOL) 20 MG tablet TAKE 1 TABLET BY MOUTH  DAILY 100 tablet 2   traMADol (ULTRAM) 50 MG tablet Take 1 tablet (50 mg total) by mouth every 12 (twelve) hours as needed. 60 tablet 3   vitamin C (VITAMIN C) 1000 MG tablet Take 1 tablet (1,000 mg total) by mouth daily. 30 tablet 0   No current facility-administered medications on file prior to visit.   Past Medical History:  Diagnosis Date   Allergy    Anemia    Arthritis    GERD (gastroesophageal reflux disease)    Hyperlipidemia    Allergies  Allergen Reactions   Cymbalta [Duloxetine Hcl] Nausea Only    Adverse reaction caused dizziness and nausea   Elemental Sulfur Swelling and Other (See Comments)    Reaction:  All over body swelling    Penicillins Rash and Other (See Comments)    Has patient had a PCN reaction causing immediate rash, facial/tongue/throat swelling, SOB or lightheadedness with hypotension: No Has patient had a PCN reaction causing severe rash involving mucus membranes or skin necrosis: No Has patient had a PCN reaction that required hospitalization No Has patient had a PCN reaction occurring within the last 10 years: No If all of the above answers are "NO", then may proceed with Cephalosporin use.    Social History   Socioeconomic History   Marital status: Widowed    Spouse name: Not on file   Number of children: 3   Years of education: Not on file   Highest education level: Some college, no degree  Occupational History   Occupation: Surveyor, quantity: [employer]    Comment: works 25-30hrs/week  Tobacco Use   Smoking status: Former   Smokeless tobacco: Never  Scientific laboratory technician Use: Never used  Substance and Sexual Activity   Alcohol use: No   Drug use: No   Sexual activity: Not Currently    Comment: 1st intercourse 76 yo-Fewer than 5 partners  Other Topics Concern   Not on file  Social History Narrative   01/22/2019:   Widowed since 10/2017; lives alone now in [place] house with 2 dogs    Has one son, and one stepson and Psychiatrist. Sons live close by, supportive.   Has 5 grandchildren and several great grandchildren, active in their lives   Continues to work PT as Scientist, water quality, likes to stay busy   [employer] community support system   Social Determinants of Health   Financial Resource Strain: Low Risk  (07/22/2022)   Overall Financial Resource Strain (CARDIA)    Difficulty of Paying Living Expenses: Not hard at all  Food Insecurity: No Food Insecurity (07/22/2022)   Hunger Vital Sign    Worried About Running Out of Food in the Last Year: Never true    Holly in the Last  Year: Never true  Transportation Needs: No Transportation Needs (07/22/2022)   PRAPARE - Hydrologist (Medical): No    Lack of Transportation (Non-Medical): No  Physical Activity: Insufficiently Active (07/22/2022)   Exercise Vital Sign    Days of Exercise per Week: 7 days    Minutes of Exercise per Session: 20 min  Stress: No Stress Concern Present (07/22/2022)   [place]    Feeling of Stress : Not at all  Social Connections: Moderately Integrated (07/22/2022)   Social Connection and Isolation Panel [NHANES]    Frequency of Communication with Friends and Family: More than three times a week    Frequency of Social Gatherings with Friends and Family: More than three times a week    Attends Religious Services: More than 4 times per year    Active Member of [employer] or Organizations: Yes    Attends Archivist Meetings: More than 4 times per year    Marital Status: Widowed   Vitals:   09/27/22 1454  BP: 120/62  Pulse: 89  Resp: 16  Temp: 98.7 F (37.1 C)  SpO2: 97%  Body mass index is 27.08 kg/m.  Physical Exam Vitals and nursing note reviewed.  Constitutional:      General: She is not in acute distress.    Appearance: She is well-developed.  HENT:     Head: Normocephalic and atraumatic.    Eyes:     Conjunctiva/sclera: Conjunctivae normal.  Cardiovascular:     Rate and Rhythm: Normal rate and regular rhythm.     Heart sounds: No murmur heard. Pulmonary:     Effort: Pulmonary effort is normal. No respiratory distress.     Breath sounds: Normal breath sounds.  Musculoskeletal:     Cervical back: No edema or erythema. No spinous process tenderness or muscular tenderness.     Thoracic back: Scoliosis present.  Lymphadenopathy:     Cervical: No cervical adenopathy.  Skin:    General: Skin is warm.     Findings: No erythema or rash.  Neurological:     Mental Status: She is alert and oriented to person, place, and time.     Comments: Antalgic,mildly unstable gait assisted with a cane.  Psychiatric:        Mood and Affect: Mood and affect normal.   ASSESSMENT AND PLAN:  [name] was seen today for alopecia.  Diagnoses and all orders for this visit:  Alopecia We discussed possible etiologies. Alopecia areata, possible causes and treatment options reviewed. She sees dermatologist regularly, last seen on 04/2022, instructed to arrange appt. Clobetasol cream on affected area to try, bid for 14 days then daily. We discussed side effects of topical steroid.  -     clobetasol cream (TEMOVATE) 0.05 %; Bid on affected area for 14 days and then daily.  Tingling sensation Left side of neck, no masses or skin changes on examination today. Instructed to monitor for new symptoms or worsening problem, in which case cervical MRI needs to be considered.  Benign paroxysmal positional vertigo due to bilateral vestibular disorder No longer on Meclizine, side effects discussed. Fall precautions to continue.  Return if symptoms worsen or fail to improve.  Ranae Casebier G. [name], MD  [facility]. [place] office.

## 2022-09-29 DIAGNOSIS — M9903 Segmental and somatic dysfunction of lumbar region: Secondary | ICD-10-CM | POA: Diagnosis not present

## 2022-09-29 DIAGNOSIS — M5136 Other intervertebral disc degeneration, lumbar region: Secondary | ICD-10-CM | POA: Diagnosis not present

## 2022-10-06 DIAGNOSIS — M5136 Other intervertebral disc degeneration, lumbar region: Secondary | ICD-10-CM | POA: Diagnosis not present

## 2022-10-06 DIAGNOSIS — M9903 Segmental and somatic dysfunction of lumbar region: Secondary | ICD-10-CM | POA: Diagnosis not present

## 2022-10-12 DIAGNOSIS — M9903 Segmental and somatic dysfunction of lumbar region: Secondary | ICD-10-CM | POA: Diagnosis not present

## 2022-10-12 DIAGNOSIS — M5136 Other intervertebral disc degeneration, lumbar region: Secondary | ICD-10-CM | POA: Diagnosis not present

## 2022-10-13 DIAGNOSIS — L638 Other alopecia areata: Secondary | ICD-10-CM | POA: Diagnosis not present

## 2022-10-20 DIAGNOSIS — M9903 Segmental and somatic dysfunction of lumbar region: Secondary | ICD-10-CM | POA: Diagnosis not present

## 2022-10-20 DIAGNOSIS — M5136 Other intervertebral disc degeneration, lumbar region: Secondary | ICD-10-CM | POA: Diagnosis not present

## 2022-10-24 ENCOUNTER — Other Ambulatory Visit: Payer: Self-pay | Admitting: Family Medicine

## 2022-10-24 DIAGNOSIS — I10 Essential (primary) hypertension: Secondary | ICD-10-CM

## 2022-10-27 DIAGNOSIS — M9903 Segmental and somatic dysfunction of lumbar region: Secondary | ICD-10-CM | POA: Diagnosis not present

## 2022-10-27 DIAGNOSIS — M5136 Other intervertebral disc degeneration, lumbar region: Secondary | ICD-10-CM | POA: Diagnosis not present

## 2022-11-02 DIAGNOSIS — M9903 Segmental and somatic dysfunction of lumbar region: Secondary | ICD-10-CM | POA: Diagnosis not present

## 2022-11-02 DIAGNOSIS — M5136 Other intervertebral disc degeneration, lumbar region: Secondary | ICD-10-CM | POA: Diagnosis not present

## 2022-11-17 DIAGNOSIS — M9903 Segmental and somatic dysfunction of lumbar region: Secondary | ICD-10-CM | POA: Diagnosis not present

## 2022-11-17 DIAGNOSIS — M5136 Other intervertebral disc degeneration, lumbar region: Secondary | ICD-10-CM | POA: Diagnosis not present

## 2022-11-24 DIAGNOSIS — L638 Other alopecia areata: Secondary | ICD-10-CM | POA: Diagnosis not present

## 2022-11-25 DIAGNOSIS — M5136 Other intervertebral disc degeneration, lumbar region: Secondary | ICD-10-CM | POA: Diagnosis not present

## 2022-11-25 DIAGNOSIS — M9903 Segmental and somatic dysfunction of lumbar region: Secondary | ICD-10-CM | POA: Diagnosis not present

## 2022-12-01 DIAGNOSIS — M9903 Segmental and somatic dysfunction of lumbar region: Secondary | ICD-10-CM | POA: Diagnosis not present

## 2022-12-01 DIAGNOSIS — M5136 Other intervertebral disc degeneration, lumbar region: Secondary | ICD-10-CM | POA: Diagnosis not present

## 2022-12-09 ENCOUNTER — Encounter: Payer: Self-pay | Admitting: Family Medicine

## 2022-12-09 ENCOUNTER — Telehealth (INDEPENDENT_AMBULATORY_CARE_PROVIDER_SITE_OTHER): Payer: Medicare Other | Admitting: Family Medicine

## 2022-12-09 DIAGNOSIS — U071 COVID-19: Secondary | ICD-10-CM | POA: Diagnosis not present

## 2022-12-09 MED ORDER — MOLNUPIRAVIR EUA 200MG CAPSULE: 4.0000 | ORAL_CAPSULE | Freq: Two times a day (BID) | ORAL | 0 refills | Status: AC

## 2022-12-09 NOTE — Progress Notes (Signed)
Virtual Visit via Video Note  I connected with Tabitha Adams on 12/09/22 at  4:00 PM EST by a video enabled telemedicine application 2/2 [id number] pandemic and verified that I am speaking with the correct person using two identifiers.  Location patient: home Location provider:work or home office Persons participating in the virtual visit: patient, provider Pt unable to enable for camera during video call.  Patient was able to hear and see this provider.  I discussed the limitations of evaluation and management by telemedicine and the availability of in person appointments. The patient expressed understanding and agreed to proceed.  No chief complaint on file.   HPI: Pt is a 77 yo female with pmh sig for HTN, GERD, tremor, osteopenia, OA, HLD, pre MD who is followed by Dr. [name] and seen for acute concern.  Pt tested positive for COVID on Wed am. Symptoms started Tuesday night Endorses feeling achy, slightly productive cough, sore throat, decreased appetite. Denies fever, chills, diarrhea Taking Tylenol. Pt had 2 COVID vaccines.     ROS: See pertinent positives and negatives per HPI.  Past Medical History:  Diagnosis Date   Allergy    Anemia    Arthritis    GERD (gastroesophageal reflux disease)    Hyperlipidemia     Past Surgical History:  Procedure Laterality Date   APPENDECTOMY     BACK SURGERY     BREAST CYST ASPIRATION Left    BREAST EXCISIONAL BIOPSY Left    BREAST EXCISIONAL BIOPSY Left    CATARACT EXTRACTION, BILATERAL     ORIF WRIST FRACTURE Right 01/25/2017   Procedure: OPEN REDUCTION INTERNAL FIXATION (ORIF) WRIST FRACTURE;  Surgeon: Roseanne Kaufman, MD;  Location: [facility];  Service: Orthopedics;  Laterality: Right;   TOTAL ABDOMINAL HYSTERECTOMY     heavy periods    Family History  Problem Relation Age of Onset   Other Mother        brain tumor, non cancerous   Stroke Father    Hypertension Father    Dementia Father    Arthritis Sister     Hypercholesterolemia Sister    Epilepsy Son    Heart Problems Son    Other Son        breathing problems   Tremor Son    Other Brother        MVA     Current Outpatient Medications:    alendronate (FOSAMAX) 70 MG tablet, TAKE 1 TABLET BY MOUTH WEEKLY  WITH 8 OZ OF PLAIN WATER 30  MINUTES BEFORE FIRST FOOD, DRINK OR MEDS. STAY UPRIGHT FOR 30  MINS, Disp: 12 tablet, Rfl: 3   amLODipine (NORVASC) 2.5 MG tablet, TAKE 1 TABLET(2.5 MG) BY MOUTH AT BEDTIME, Disp: 90 tablet, Rfl: 1   Apoaequorin (PREVAGEN EXTRA STRENGTH) 20 MG CAPS, Take 1 tablet by mouth daily., Disp: , Rfl:    b complex vitamins capsule, Take 1 capsule by mouth daily., Disp: , Rfl:    Calcium-Phosphorus-Vitamin D 250-100-500 MG-MG-UNIT CHEW, Chew by mouth., Disp: , Rfl:    cetirizine (ZYRTEC) 10 MG tablet, Take 10 mg by mouth at bedtime., Disp: , Rfl:    Cholecalciferol (VITAMIN D3) 50 MCG (2000 UT) TABS, Take by mouth daily., Disp: , Rfl:    citalopram (CELEXA) 10 MG tablet, TAKE 1 TABLET(10 MG) BY MOUTH DAILY, Disp: 90 tablet, Rfl: 1   clobetasol cream (TEMOVATE) 0.05 %, Bid on affected area for 14 days and then daily., Disp: 45 g, Rfl: 0   ferrous sulfate 325 (  65 FE) MG tablet, Take 325 mg by mouth daily with breakfast., Disp: , Rfl:    fluticasone (FLONASE) 50 MCG/ACT nasal spray, SHAKE LIQUID AND USE 1 SPRAY IN EACH NOSTRIL TWICE DAILY AS NEEDED FOR ALLERGIES OR RHINITIS, Disp: 16 g, Rfl: 11   glucosamine-chondroitin 500-400 MG tablet, Take 1 tablet by mouth 2 (two) times daily., Disp: , Rfl:    losartan (COZAAR) 50 MG tablet, TAKE 1 TABLET BY MOUTH DAILY, Disp: 100 tablet, Rfl: 2   meclizine (ANTIVERT) 25 MG tablet, TAKE 1/2 TO 1 TABLET BY  MOUTH TWICE DAILY AS NEEDED FOR DIZZINESS, Disp: 90 tablet, Rfl: 1   meloxicam (MOBIC) 15 MG tablet, TAKE 1 TABLET BY MOUTH DAILY, Disp: 100 tablet, Rfl: 2   molnupiravir EUA (LAGEVRIO) 200 mg CAPS capsule, Take 4 capsules (800 mg total) by mouth 2 (two) times daily for 5 days., Disp:  40 capsule, Rfl: 0   montelukast (SINGULAIR) 10 MG tablet, TAKE 1 TABLET BY MOUTH AT  BEDTIME, Disp: 100 tablet, Rfl: 2   Multiple Vitamin (MULTIVITAMIN WITH MINERALS) TABS tablet, Take 1 tablet by mouth daily., Disp: , Rfl:    omeprazole (PRILOSEC) 20 MG capsule, TAKE 1 CAPSULE BY MOUTH DAILY, Disp: 100 capsule, Rfl: 2   polyethylene glycol (MIRALAX / GLYCOLAX) 17 g packet, Take 17 g by mouth daily., Disp: , Rfl:    pravastatin (PRAVACHOL) 20 MG tablet, TAKE 1 TABLET BY MOUTH  DAILY, Disp: 100 tablet, Rfl: 2   traMADol (ULTRAM) 50 MG tablet, Take 1 tablet (50 mg total) by mouth every 12 (twelve) hours as needed., Disp: 60 tablet, Rfl: 3   vitamin C (VITAMIN C) 1000 MG tablet, Take 1 tablet (1,000 mg total) by mouth daily., Disp: 30 tablet, Rfl: 0  EXAM:  VITALS per patient if applicable:  RR between 12-20 bpm  GENERAL: alert, oriented, appears well and in no acute distress  HEENT: atraumatic, conjunctiva clear, no obvious abnormalities on inspection of external nose and ears  NECK: normal movements of the head and neck  LUNGS: on inspection no signs of respiratory distress, breathing rate appears normal, no obvious gross SOB, gasping or wheezing  CV: no obvious cyanosis  MS: moves all visible extremities without noticeable abnormality  PSYCH/NEURO: pleasant and cooperative, no obvious depression or anxiety, speech and thought processing grossly intact  ASSESSMENT AND PLAN:  Discussed the following assessment and plan:  COVID-19 virus infection -Symptoms starting Tuesday, 12/07/2022 with positive COVID test from 12/08/2022. -Discussed r/b/a of antiviral medications.  Patient wishes to start molnupiravir. -Continue Tylenol as needed. -Discussed other supportive care including rest, OTC cough/cold medications, hydration, etc. -Given strict precautions -Discussed quarantine  - Plan: molnupiravir EUA (LAGEVRIO) 200 mg CAPS capsule   F/u prn for continued or worsened symptoms.     I discussed the assessment and treatment plan with the patient. The patient was provided an opportunity to ask questions and all were answered. The patient agreed with the plan and demonstrated an understanding of the instructions.   The patient was advised to call back or seek an in-person evaluation if the symptoms worsen or if the condition fails to improve as anticipated.  I provided 12 minutes of non-face-to-face time during this encounter.   Billie Ruddy, MD

## 2022-12-10 ENCOUNTER — Telehealth: Payer: Medicare Other | Admitting: Family

## 2022-12-15 DIAGNOSIS — M9903 Segmental and somatic dysfunction of lumbar region: Secondary | ICD-10-CM | POA: Diagnosis not present

## 2022-12-15 DIAGNOSIS — M5136 Other intervertebral disc degeneration, lumbar region: Secondary | ICD-10-CM | POA: Diagnosis not present

## 2022-12-22 DIAGNOSIS — M9903 Segmental and somatic dysfunction of lumbar region: Secondary | ICD-10-CM | POA: Diagnosis not present

## 2022-12-22 DIAGNOSIS — M5136 Other intervertebral disc degeneration, lumbar region: Secondary | ICD-10-CM | POA: Diagnosis not present

## 2022-12-23 ENCOUNTER — Other Ambulatory Visit: Payer: Self-pay | Admitting: Family Medicine

## 2022-12-23 DIAGNOSIS — M159 Polyosteoarthritis, unspecified: Secondary | ICD-10-CM

## 2022-12-23 DIAGNOSIS — G894 Chronic pain syndrome: Secondary | ICD-10-CM

## 2022-12-24 NOTE — Telephone Encounter (Signed)
Last filled 11/21/22

## 2023-01-02 DIAGNOSIS — M79644 Pain in right finger(s): Secondary | ICD-10-CM | POA: Diagnosis not present

## 2023-01-03 ENCOUNTER — Other Ambulatory Visit: Payer: Self-pay | Admitting: Family Medicine

## 2023-01-03 DIAGNOSIS — F330 Major depressive disorder, recurrent, mild: Secondary | ICD-10-CM

## 2023-01-04 DIAGNOSIS — M5136 Other intervertebral disc degeneration, lumbar region: Secondary | ICD-10-CM | POA: Diagnosis not present

## 2023-01-04 DIAGNOSIS — M9903 Segmental and somatic dysfunction of lumbar region: Secondary | ICD-10-CM | POA: Diagnosis not present

## 2023-01-05 DIAGNOSIS — M79645 Pain in left finger(s): Secondary | ICD-10-CM | POA: Diagnosis not present

## 2023-01-05 DIAGNOSIS — S62521A Displaced fracture of distal phalanx of right thumb, initial encounter for closed fracture: Secondary | ICD-10-CM | POA: Diagnosis not present

## 2023-01-05 DIAGNOSIS — R52 Pain, unspecified: Secondary | ICD-10-CM | POA: Diagnosis not present

## 2023-01-05 DIAGNOSIS — M13841 Other specified arthritis, right hand: Secondary | ICD-10-CM | POA: Diagnosis not present

## 2023-01-18 DIAGNOSIS — M5136 Other intervertebral disc degeneration, lumbar region: Secondary | ICD-10-CM | POA: Diagnosis not present

## 2023-01-18 DIAGNOSIS — M9903 Segmental and somatic dysfunction of lumbar region: Secondary | ICD-10-CM | POA: Diagnosis not present

## 2023-01-19 DIAGNOSIS — S62521D Displaced fracture of distal phalanx of right thumb, subsequent encounter for fracture with routine healing: Secondary | ICD-10-CM | POA: Diagnosis not present

## 2023-01-19 DIAGNOSIS — M13841 Other specified arthritis, right hand: Secondary | ICD-10-CM | POA: Diagnosis not present

## 2023-02-01 DIAGNOSIS — M9903 Segmental and somatic dysfunction of lumbar region: Secondary | ICD-10-CM | POA: Diagnosis not present

## 2023-02-01 DIAGNOSIS — M5136 Other intervertebral disc degeneration, lumbar region: Secondary | ICD-10-CM | POA: Diagnosis not present

## 2023-02-08 DIAGNOSIS — M9903 Segmental and somatic dysfunction of lumbar region: Secondary | ICD-10-CM | POA: Diagnosis not present

## 2023-02-08 DIAGNOSIS — M5136 Other intervertebral disc degeneration, lumbar region: Secondary | ICD-10-CM | POA: Diagnosis not present

## 2023-02-21 ENCOUNTER — Other Ambulatory Visit: Payer: Self-pay | Admitting: Family Medicine

## 2023-02-21 DIAGNOSIS — E785 Hyperlipidemia, unspecified: Secondary | ICD-10-CM

## 2023-02-22 DIAGNOSIS — M5136 Other intervertebral disc degeneration, lumbar region: Secondary | ICD-10-CM | POA: Diagnosis not present

## 2023-02-22 DIAGNOSIS — M9903 Segmental and somatic dysfunction of lumbar region: Secondary | ICD-10-CM | POA: Diagnosis not present

## 2023-03-01 DIAGNOSIS — M419 Scoliosis, unspecified: Secondary | ICD-10-CM | POA: Diagnosis not present

## 2023-03-08 DIAGNOSIS — M5136 Other intervertebral disc degeneration, lumbar region: Secondary | ICD-10-CM | POA: Diagnosis not present

## 2023-03-08 DIAGNOSIS — M9903 Segmental and somatic dysfunction of lumbar region: Secondary | ICD-10-CM | POA: Diagnosis not present

## 2023-03-10 DIAGNOSIS — M419 Scoliosis, unspecified: Secondary | ICD-10-CM | POA: Diagnosis not present

## 2023-03-10 DIAGNOSIS — M5416 Radiculopathy, lumbar region: Secondary | ICD-10-CM | POA: Diagnosis not present

## 2023-03-23 DIAGNOSIS — M9903 Segmental and somatic dysfunction of lumbar region: Secondary | ICD-10-CM | POA: Diagnosis not present

## 2023-03-23 DIAGNOSIS — M5136 Other intervertebral disc degeneration, lumbar region: Secondary | ICD-10-CM | POA: Diagnosis not present

## 2023-03-30 ENCOUNTER — Other Ambulatory Visit: Payer: Self-pay | Admitting: Family Medicine

## 2023-03-30 DIAGNOSIS — M159 Polyosteoarthritis, unspecified: Secondary | ICD-10-CM

## 2023-03-30 DIAGNOSIS — G894 Chronic pain syndrome: Secondary | ICD-10-CM

## 2023-03-30 DIAGNOSIS — J309 Allergic rhinitis, unspecified: Secondary | ICD-10-CM

## 2023-04-06 DIAGNOSIS — M9903 Segmental and somatic dysfunction of lumbar region: Secondary | ICD-10-CM | POA: Diagnosis not present

## 2023-04-06 DIAGNOSIS — M5136 Other intervertebral disc degeneration, lumbar region: Secondary | ICD-10-CM | POA: Diagnosis not present

## 2023-04-12 DIAGNOSIS — M5416 Radiculopathy, lumbar region: Secondary | ICD-10-CM | POA: Diagnosis not present

## 2023-04-13 DIAGNOSIS — H43393 Other vitreous opacities, bilateral: Secondary | ICD-10-CM | POA: Diagnosis not present

## 2023-04-13 DIAGNOSIS — H524 Presbyopia: Secondary | ICD-10-CM | POA: Diagnosis not present

## 2023-04-13 DIAGNOSIS — H52221 Regular astigmatism, right eye: Secondary | ICD-10-CM | POA: Diagnosis not present

## 2023-04-13 DIAGNOSIS — H5203 Hypermetropia, bilateral: Secondary | ICD-10-CM | POA: Diagnosis not present

## 2023-04-27 DIAGNOSIS — M9903 Segmental and somatic dysfunction of lumbar region: Secondary | ICD-10-CM | POA: Diagnosis not present

## 2023-04-27 DIAGNOSIS — M5136 Other intervertebral disc degeneration, lumbar region: Secondary | ICD-10-CM | POA: Diagnosis not present

## 2023-04-28 DIAGNOSIS — M419 Scoliosis, unspecified: Secondary | ICD-10-CM | POA: Diagnosis not present

## 2023-04-28 DIAGNOSIS — M5416 Radiculopathy, lumbar region: Secondary | ICD-10-CM | POA: Diagnosis not present

## 2023-05-10 DIAGNOSIS — M5136 Other intervertebral disc degeneration, lumbar region: Secondary | ICD-10-CM | POA: Diagnosis not present

## 2023-05-10 DIAGNOSIS — M9903 Segmental and somatic dysfunction of lumbar region: Secondary | ICD-10-CM | POA: Diagnosis not present

## 2023-05-23 DIAGNOSIS — M5416 Radiculopathy, lumbar region: Secondary | ICD-10-CM | POA: Diagnosis not present

## 2023-05-24 ENCOUNTER — Other Ambulatory Visit: Payer: Self-pay | Admitting: Family Medicine

## 2023-05-24 DIAGNOSIS — Z1231 Encounter for screening mammogram for malignant neoplasm of breast: Secondary | ICD-10-CM

## 2023-05-25 DIAGNOSIS — M9903 Segmental and somatic dysfunction of lumbar region: Secondary | ICD-10-CM | POA: Diagnosis not present

## 2023-05-25 DIAGNOSIS — M5136 Other intervertebral disc degeneration, lumbar region: Secondary | ICD-10-CM | POA: Diagnosis not present

## 2023-06-08 DIAGNOSIS — M5136 Other intervertebral disc degeneration, lumbar region: Secondary | ICD-10-CM | POA: Diagnosis not present

## 2023-06-08 DIAGNOSIS — M9903 Segmental and somatic dysfunction of lumbar region: Secondary | ICD-10-CM | POA: Diagnosis not present

## 2023-06-11 ENCOUNTER — Other Ambulatory Visit: Payer: Self-pay | Admitting: Family Medicine

## 2023-06-11 DIAGNOSIS — I10 Essential (primary) hypertension: Secondary | ICD-10-CM

## 2023-06-11 DIAGNOSIS — J309 Allergic rhinitis, unspecified: Secondary | ICD-10-CM

## 2023-06-11 DIAGNOSIS — K219 Gastro-esophageal reflux disease without esophagitis: Secondary | ICD-10-CM

## 2023-06-13 DIAGNOSIS — M5416 Radiculopathy, lumbar region: Secondary | ICD-10-CM | POA: Diagnosis not present

## 2023-06-13 DIAGNOSIS — M419 Scoliosis, unspecified: Secondary | ICD-10-CM | POA: Diagnosis not present

## 2023-06-17 ENCOUNTER — Ambulatory Visit
Admission: RE | Admit: 2023-06-17 | Discharge: 2023-06-17 | Disposition: A | Discharge status: Home / Self Care | Payer: Medicare Other | Source: Ambulatory Visit | Attending: Family Medicine | Admitting: Family Medicine

## 2023-06-17 DIAGNOSIS — Z1231 Encounter for screening mammogram for malignant neoplasm of breast: Secondary | ICD-10-CM

## 2023-06-22 DIAGNOSIS — M9903 Segmental and somatic dysfunction of lumbar region: Secondary | ICD-10-CM | POA: Diagnosis not present

## 2023-06-22 DIAGNOSIS — M5136 Other intervertebral disc degeneration, lumbar region: Secondary | ICD-10-CM | POA: Diagnosis not present

## 2023-06-30 ENCOUNTER — Telehealth: Payer: Self-pay | Admitting: Family Medicine

## 2023-06-30 ENCOUNTER — Encounter (INDEPENDENT_AMBULATORY_CARE_PROVIDER_SITE_OTHER): Payer: Self-pay

## 2023-06-30 NOTE — Telephone Encounter (Signed)
Requesting a call regarding medication for the patient, needs extractions and would like to consult with you

## 2023-07-01 NOTE — Telephone Encounter (Signed)
Tried returning call, but dental office is closed. Will try again Monday.

## 2023-07-04 NOTE — Telephone Encounter (Signed)
I spoke with Dr. Mia Creek. Patient is needing some teeth extracted & dental implants. He is aware pt is on Fosamax and wanted to discuss with you. Anytime tomorrow afternoon is fine - phone number is [phone].

## 2023-07-06 DIAGNOSIS — M5136 Other intervertebral disc degeneration, lumbar region: Secondary | ICD-10-CM | POA: Diagnosis not present

## 2023-07-06 DIAGNOSIS — M9903 Segmental and somatic dysfunction of lumbar region: Secondary | ICD-10-CM | POA: Diagnosis not present

## 2023-07-08 ENCOUNTER — Other Ambulatory Visit: Payer: Self-pay | Admitting: Family Medicine

## 2023-07-08 DIAGNOSIS — F330 Major depressive disorder, recurrent, mild: Secondary | ICD-10-CM

## 2023-07-11 NOTE — Telephone Encounter (Signed)
Called Dr. Mart Piggs office, she is not in the office at this time. Her receptionist will let her know I was returning call in regard to Tabitha Adams. She is on Fosamax and planning on having dental implants. Tyliyah Mcmeekin [place], MD

## 2023-07-20 DIAGNOSIS — M9903 Segmental and somatic dysfunction of lumbar region: Secondary | ICD-10-CM | POA: Diagnosis not present

## 2023-07-20 DIAGNOSIS — M5136 Other intervertebral disc degeneration, lumbar region: Secondary | ICD-10-CM | POA: Diagnosis not present

## 2023-07-25 ENCOUNTER — Other Ambulatory Visit: Payer: Self-pay | Admitting: Family Medicine

## 2023-07-25 DIAGNOSIS — G894 Chronic pain syndrome: Secondary | ICD-10-CM

## 2023-07-25 DIAGNOSIS — M159 Polyosteoarthritis, unspecified: Secondary | ICD-10-CM

## 2023-07-26 NOTE — Telephone Encounter (Signed)
Pt checking on progress of this refill. Cpe scheduled for 07/27/23

## 2023-07-27 ENCOUNTER — Ambulatory Visit (INDEPENDENT_AMBULATORY_CARE_PROVIDER_SITE_OTHER): Payer: Medicare Other

## 2023-07-27 ENCOUNTER — Other Ambulatory Visit: Payer: Self-pay | Admitting: Family Medicine

## 2023-07-27 VITALS — Ht 59.5 in | Wt 134.0 lb

## 2023-07-27 DIAGNOSIS — Z0000 Encounter for general adult medical examination without abnormal findings: Secondary | ICD-10-CM

## 2023-07-27 DIAGNOSIS — G894 Chronic pain syndrome: Secondary | ICD-10-CM

## 2023-07-27 DIAGNOSIS — M159 Polyosteoarthritis, unspecified: Secondary | ICD-10-CM

## 2023-07-27 MED ORDER — TRAMADOL HCL 50 MG PO TABS
50.0000 mg | ORAL_TABLET | Freq: Two times a day (BID) | ORAL | 0 refills | Status: DC | PRN
Start: 2023-07-27 — End: 2023-08-23

## 2023-07-27 NOTE — Patient Instructions (Addendum)
Ms. Pietz , Thank you for taking time to come for your Medicare Wellness Visit. I appreciate your ongoing commitment to your health goals. Please review the following plan we discussed and let me know if I can assist you in the future.   Referrals/Orders/Follow-Ups/Clinician Recommendations:   This is a list of the screening recommended for you and due dates:  Health Maintenance  Topic Date Due   Zoster (Shingles) Vaccine (2 of 2) 01/20/2021   Flu Shot  06/16/2023   COVID-19 Vaccine (4 - 2023-24 season) 07/17/2023   Medicare Annual Wellness Visit  07/26/2024   DTaP/Tdap/Td vaccine (3 - Td or Tdap) 08/04/2032   Pneumonia Vaccine  Completed   DEXA scan (bone density measurement)  Completed   Hepatitis C Screening  Completed   HPV Vaccine  Aged Out   Colon Cancer Screening  Discontinued    Advanced directives: (Copy Requested) Please bring a copy of your health care power of attorney and living will to the office to be added to your chart at your convenience.  Next Medicare Annual Wellness Visit scheduled for next year: Yes

## 2023-07-27 NOTE — Progress Notes (Signed)
Subjective:   Tabitha Adams is a 77 y.o. female who presents for Medicare Annual (Subsequent) preventive examination.  Visit Complete: Virtual  I connected with  Tabitha Adams on 07/27/23 by a audio enabled telemedicine application and verified that I am speaking with the correct person using two identifiers.  Patient Location: Home  Provider Location: Home Office  I discussed the limitations of evaluation and management by telemedicine. The patient expressed understanding and agreed to proceed.  Patient Medicare AWV questionnaire was completed by the patient on 07/25/23; I have confirmed that all information answered by patient is correct and no changes since this date.  Review of Systems    Vital Signs: Unable to obtain new vitals due to this being a telehealth visit.  Cardiac Risk Factors include: advanced age (>40men, >58 women);hypertension     Objective:    Today's Vitals   07/27/23 1504  Weight: 134 lb (60.8 kg)  Height: 4' 11.5" (1.511 m)   Body mass index is 26.61 kg/m.     07/27/2023    3:12 PM 07/22/2022    2:12 PM 07/13/2022    1:27 PM 04/19/2022    5:14 PM 07/16/2021    3:29 PM 09/01/2020    2:30 PM 01/22/2019    3:17 PM  Advanced Directives  Does Patient Have a Medical Advance Directive? Yes Yes Yes No Yes Yes Yes  Type of Estate agent of [place];Living will Healthcare Power of [place];Living will   Healthcare Power of [place];Living will Healthcare Power of [place];Living will Healthcare Power of [place];Living will  Does patient want to make changes to medical advance directive?   No - Patient declined      Copy of Healthcare Power of Attorney in Chart? No - copy requested No - copy requested   No - copy requested  No - copy requested  Would patient like information on creating a medical advance directive?    No - Patient declined       Current Medications (verified) Outpatient Encounter Medications as of 07/27/2023   Medication Sig   alendronate (FOSAMAX) 70 MG tablet TAKE 1 TABLET BY MOUTH WEEKLY  WITH 8 OZ OF PLAIN WATER 30  MINUTES BEFORE FIRST FOOD, DRINK OR MEDS. STAY UPRIGHT FOR 30  MINS   amLODipine (NORVASC) 2.5 MG tablet TAKE 1 TABLET(2.5 MG) BY MOUTH AT BEDTIME   Apoaequorin (PREVAGEN EXTRA STRENGTH) 20 MG CAPS Take 1 tablet by mouth daily.   b complex vitamins capsule Take 1 capsule by mouth daily.   Calcium-Phosphorus-Vitamin D 250-100-500 MG-MG-UNIT CHEW Chew by mouth.   cetirizine (ZYRTEC) 10 MG tablet Take 10 mg by mouth at bedtime.   Cholecalciferol (VITAMIN D3) 50 MCG (2000 UT) TABS Take by mouth daily.   citalopram (CELEXA) 10 MG tablet TAKE 1 TABLET(10 MG) BY MOUTH DAILY   clobetasol cream (TEMOVATE) 0.05 % Bid on affected area for 14 days and then daily.   ferrous sulfate 325 (65 FE) MG tablet Take 325 mg by mouth daily with breakfast.   fluticasone (FLONASE) 50 MCG/ACT nasal spray SHAKE LIQUID AND USE 1 SPRAY IN EACH NOSTRIL TWICE DAILY AS NEEDED FOR ALLERGIES OR RHINITIS   glucosamine-chondroitin 500-400 MG tablet Take 1 tablet by mouth 2 (two) times daily.   losartan (COZAAR) 50 MG tablet TAKE 1 TABLET BY MOUTH DAILY   meclizine (ANTIVERT) 25 MG tablet TAKE 1/2 TO 1 TABLET BY  MOUTH TWICE DAILY AS NEEDED FOR DIZZINESS   meloxicam (MOBIC) 15 MG  tablet TAKE 1 TABLET BY MOUTH DAILY   montelukast (SINGULAIR) 10 MG tablet TAKE 1 TABLET BY MOUTH AT  BEDTIME   Multiple Vitamin (MULTIVITAMIN WITH MINERALS) TABS tablet Take 1 tablet by mouth daily.   omeprazole (PRILOSEC) 20 MG capsule TAKE 1 CAPSULE BY MOUTH DAILY   polyethylene glycol (MIRALAX / GLYCOLAX) 17 g packet Take 17 g by mouth daily.   pravastatin (PRAVACHOL) 20 MG tablet TAKE 1 TABLET BY MOUTH DAILY   traMADol (ULTRAM) 50 MG tablet TAKE 1 TABLET(50 MG) BY MOUTH EVERY 12 HOURS AS NEEDED   vitamin C (VITAMIN C) 1000 MG tablet Take 1 tablet (1,000 mg total) by mouth daily.   No facility-administered encounter medications on file  as of 07/27/2023.    Allergies (verified) Cymbalta [duloxetine hcl], Elemental sulfur, and Penicillins   History: Past Medical History:  Diagnosis Date   Allergy    Anemia    Arthritis    GERD (gastroesophageal reflux disease)    Hyperlipidemia    Past Surgical History:  Procedure Laterality Date   APPENDECTOMY     BACK SURGERY     BREAST CYST ASPIRATION Left    BREAST EXCISIONAL BIOPSY Left    BREAST EXCISIONAL BIOPSY Left    CATARACT EXTRACTION, BILATERAL     ORIF WRIST FRACTURE Right 01/25/2017   Procedure: OPEN REDUCTION INTERNAL FIXATION (ORIF) WRIST FRACTURE;  Surgeon: Dominica Severin, MD;  Location: [facility] OR;  Service: Orthopedics;  Laterality: Right;   TOTAL ABDOMINAL HYSTERECTOMY     heavy periods   Family History  Problem Relation Age of Onset   Other Mother        brain tumor, non cancerous   Stroke Father    Hypertension Father    Dementia Father    Arthritis Sister    Hypercholesterolemia Sister    Epilepsy Son    Heart Problems Son    Other Son        breathing problems   Tremor Son    Other Brother        MVA   Social History   Socioeconomic History   Marital status: Widowed    Spouse name: Not on file   Number of children: 3   Years of education: Not on file   Highest education level: Some college, no degree  Occupational History   Occupation: Lobbyist: [employer]    Comment: works 25-30hrs/week  Tobacco Use   Smoking status: Former   Smokeless tobacco: Never  Advertising account planner   Vaping status: Never Used  Substance and Sexual Activity   Alcohol use: No   Drug use: No   Sexual activity: Not Currently    Comment: 1st intercourse 77 yo-Fewer than 5 partners  Other Topics Concern   Not on file  Social History Narrative   01/22/2019:   Widowed since 10/2017; lives alone now in [place] house with 2 dogs   Has one son, and one stepson and Museum/gallery conservator. Sons live close by, supportive.   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, nature, likes to stay busy   [employer] community support system   Social Determinants of Health   Financial Resource Strain: Low Risk  (07/25/2023)   Overall Financial Resource Strain (CARDIA)    Difficulty of Paying Living Expenses: Not very hard  Food Insecurity: No Food Insecurity (07/25/2023)   Hunger Vital Sign    Worried About Running Out of  Food in the Last Year: Never true    Ran Out of Food in the Last Year: Never true  Transportation Needs: No Transportation Needs (07/25/2023)   PRAPARE - Administrator, Civil Service (Medical): No    Lack of Transportation (Non-Medical): No  Physical Activity: Insufficiently Active (07/25/2023)   Exercise Vital Sign    Days of Exercise per Week: 1 day    Minutes of Exercise per Session: 10 min  Stress: No Stress Concern Present (07/25/2023)   [employer] of Occupational Health - Occupational Stress Questionnaire    Feeling of Stress : Not at all  Social Connections: Unknown (07/25/2023)   Social Connection and Isolation Panel [NHANES]    Frequency of Communication with Friends and Family: More than three times a week    Frequency of Social Gatherings with Friends and Family: Twice a week    Attends Religious Services: Not on Insurance claims handler of Clubs or Organizations: Yes    Attends Banker Meetings: 1 to 4 times per year    Marital Status: Widowed    Tobacco Counseling Counseling given: Not Answered   Clinical Intake:  Pre-visit preparation completed: Yes  Pain : No/denies pain     BMI - recorded: 26.61 Nutritional Status: BMI 25 -29 Overweight Nutritional Risks: None Diabetes: No  How often do you need to have someone help you when you read instructions, pamphlets, or other written materials from your doctor or pharmacy?: 1 - Never  Interpreter Needed?: No  Information entered by :: Theresa Mulligan LPN   Activities of Daily Living    07/25/2023     3:00 PM 08/13/2022    3:35 PM  In your present state of health, do you have any difficulty performing the following activities:  Hearing? 0 0  Vision? 0 0  Difficulty concentrating or making decisions? 0 0  Walking or climbing stairs? 0 1  Dressing or bathing? 0 0  Doing errands, shopping? 0 0  Preparing Food and eating ? N   Using the Toilet? N   In the past six months, have you accidently leaked urine? Y   Do you have problems with loss of bowel control? N   Managing your Medications? N   Managing your Finances? N   Housekeeping or managing your Housekeeping? N     Patient Care Team: [place], [name], MD as PCP - General (Family Medicine) Blima Ledger, OD (Optometry) Verner Chol, [facility] (Inactive) as Pharmacist (Pharmacist)  Indicate any recent Medical Services you may have received from other than [facility] providers in the past year (date may be approximate).     Assessment:   This is a routine wellness examination for [place].  Hearing/Vision screen Hearing Screening - Comments:: Denies hearing difficulties   Vision Screening - Comments:: Wears rx glasses - up to date with routine eye exams with  Dr Hyacinth Meeker   Goals Addressed               This Visit's Progress     Patient stated (pt-stated)        Fall prevention. Work on my balance.       Depression Screen    07/27/2023    3:08 PM 09/27/2022    3:03 PM 08/13/2022    3:32 PM 07/22/2022    2:06 PM 06/28/2022    3:36 PM 12/07/2021    4:13 PM 08/28/2021   10:01 PM  PHQ 2/9 Scores  PHQ -  2 Score 0 0 0 0 0 2 1  PHQ- 9 Score   0 0 2 7 4     Fall Risk    07/25/2023    3:00 PM 09/27/2022    3:03 PM 08/13/2022    3:32 PM 07/22/2022    2:10 PM 07/19/2022    5:54 PM  Fall Risk   Falls in the past year? 1 0 1 1 1   Number falls in past yr: 1 0 0 0 1  Injury with Fall? 1 0 1 1 1   Comment Dislocated rt thumb. Followed by medical attention   Head injury. Followed by ER Medical attention   Risk for fall due to :   History of fall(s) History of fall(s);Impaired balance/gait;Medication side effect Impaired balance/gait   Follow up  Falls evaluation completed Falls evaluation completed;Education provided Falls prevention discussed     MEDICARE RISK AT HOME: Medicare Risk at Home Any stairs in or around the home?: Yes If so, are there any without handrails?: No Home free of loose throw rugs in walkways, pet beds, electrical cords, etc?: Yes Adequate lighting in your home to reduce risk of falls?: Yes Life alert?: No Use of a cane, walker or w/c?: No Grab bars in the bathroom?: Yes Shower chair or bench in shower?: Yes Elevated toilet seat or a handicapped toilet?: No  TIMED UP AND GO:  Was the test performed?  No    Cognitive Function:        07/27/2023    3:12 PM 07/22/2022    2:12 PM  6CIT Screen  What Year? 0 points 0 points  What month? 0 points 0 points  What time? 0 points 0 points  Count back from 20 0 points 0 points  Months in reverse 0 points 0 points  Repeat phrase 0 points 0 points  Total Score 0 points 0 points    Immunizations Immunization History  Administered Date(s) Administered   Fluad Quad(high Dose 65+) 08/06/2019, 08/28/2021, 08/13/2022   Influenza, High Dose Seasonal PF 08/31/2016, 09/07/2017, 07/24/2018   PFIZER(Purple Top)SARS-COV-2 Vaccination 01/05/2020, 01/30/2020, 11/03/2020   Pneumococcal Conjugate-13 01/22/2019   Pneumococcal Polysaccharide-23 07/06/2020   Tdap 11/16/2015, 08/04/2022   Zoster Recombinant(Shingrix) 11/25/2020    TDAP status: Up to date  Flu Vaccine status: Due, Education has been provided regarding the importance of this vaccine. Advised may receive this vaccine at local pharmacy or Health Dept. Aware to provide a copy of the vaccination record if obtained from local pharmacy or Health Dept. Verbalized acceptance and understanding.  Pneumococcal vaccine status: Up to date  Covid-19 vaccine status: Declined, Education has been  provided regarding the importance of this vaccine but patient still declined. Advised may receive this vaccine at local pharmacy or Health Dept.or vaccine clinic. Aware to provide a copy of the vaccination record if obtained from local pharmacy or Health Dept. Verbalized acceptance and understanding.  Qualifies for Shingles Vaccine? Yes   Zostavax completed Yes   Shingrix Completed?: Yes  Screening Tests Health Maintenance  Topic Date Due   Zoster Vaccines- Shingrix (2 of 2) 01/20/2021   INFLUENZA VACCINE  06/16/2023   COVID-19 Vaccine (4 - 2023-24 season) 07/17/2023   Medicare Annual Wellness (AWV)  07/26/2024   DTaP/Tdap/Td (3 - Td or Tdap) 08/04/2032   Pneumonia Vaccine 18+ Years old  Completed   DEXA SCAN  Completed   Hepatitis C Screening  Completed   HPV VACCINES  Aged Out   Colonoscopy  Discontinued    Health  Maintenance  Health Maintenance Due  Topic Date Due   Zoster Vaccines- Shingrix (2 of 2) 01/20/2021   INFLUENZA VACCINE  06/16/2023   COVID-19 Vaccine (4 - 2023-24 season) 07/17/2023        Bone Density status: Completed 06/05/19. Results reflect: Bone density results: OSTEOPENIA. Repeat every   years.  Lung Cancer Screening: (Low Dose CT Chest recommended if Age 22-80 years, 20 pack-year currently smoking OR have quit w/in 15years.) does not qualify.     Additional Screening:  Hepatitis C Screening: does qualify; Completed 06/11/16  Vision Screening: Recommended annual ophthalmology exams for early detection of glaucoma and other disorders of the eye. Is the patient up to date with their annual eye exam?  Yes  Who is the provider or what is the name of the office in which the patient attends annual eye exams? Dr Hyacinth Meeker If pt is not established with a provider, would they like to be referred to a provider to establish care? No .   Dental Screening: Recommended annual dental exams for proper oral hygiene    Community Resource Referral / Chronic Care  Management:  CRR required this visit?  No   CCM required this visit?  No     Plan:     I have personally reviewed and noted the following in the patient's chart:   Medical and social history Use of alcohol, tobacco or illicit drugs  Current medications and supplements including opioid prescriptions. Patient is not currently taking opioid prescriptions. Functional ability and status Nutritional status Physical activity Advanced directives List of other physicians Hospitalizations, surgeries, and ER visits in previous 12 months Vitals Screenings to include cognitive, depression, and falls Referrals and appointments  In addition, I have reviewed and discussed with patient certain preventive protocols, quality metrics, and best practice recommendations. A written personalized care plan for preventive services as well as general preventive health recommendations were provided to patient.     Tillie Rung, LPN   07/13/5620   After Visit Summary: (MyChart) Due to this being a telephonic visit, the after visit summary with patients personalized plan was offered to patient via MyChart   Nurse Notes: None

## 2023-08-01 ENCOUNTER — Encounter: Payer: Self-pay | Admitting: Family Medicine

## 2023-08-01 ENCOUNTER — Ambulatory Visit (INDEPENDENT_AMBULATORY_CARE_PROVIDER_SITE_OTHER): Payer: Medicare Other | Admitting: Family Medicine

## 2023-08-01 VITALS — BP 138/70 | HR 88 | Temp 98.2°F | Resp 16 | Ht <= 58 in | Wt 137.2 lb

## 2023-08-01 DIAGNOSIS — R1013 Epigastric pain: Secondary | ICD-10-CM

## 2023-08-01 DIAGNOSIS — Z0000 Encounter for general adult medical examination without abnormal findings: Secondary | ICD-10-CM | POA: Diagnosis not present

## 2023-08-01 DIAGNOSIS — Z23 Encounter for immunization: Secondary | ICD-10-CM

## 2023-08-01 DIAGNOSIS — E785 Hyperlipidemia, unspecified: Secondary | ICD-10-CM | POA: Diagnosis not present

## 2023-08-01 DIAGNOSIS — I10 Essential (primary) hypertension: Secondary | ICD-10-CM

## 2023-08-01 DIAGNOSIS — M8580 Other specified disorders of bone density and structure, unspecified site: Secondary | ICD-10-CM | POA: Diagnosis not present

## 2023-08-01 DIAGNOSIS — R7303 Prediabetes: Secondary | ICD-10-CM

## 2023-08-01 DIAGNOSIS — G894 Chronic pain syndrome: Secondary | ICD-10-CM | POA: Diagnosis not present

## 2023-08-01 DIAGNOSIS — F330 Major depressive disorder, recurrent, mild: Secondary | ICD-10-CM | POA: Diagnosis not present

## 2023-08-01 LAB — CBC
HCT: 31.5 % — ABNORMAL LOW (ref 36.0–46.0)
Hemoglobin: 10.5 g/dL — ABNORMAL LOW (ref 12.0–15.0)
MCHC: 33.4 g/dL (ref 30.0–36.0)
MCV: 87.6 fl (ref 78.0–100.0)
Platelets: 289 10*3/uL (ref 150.0–400.0)
RBC: 3.59 Mil/uL — ABNORMAL LOW (ref 3.87–5.11)
RDW: 13.3 % (ref 11.5–15.5)
WBC: 5.6 10*3/uL (ref 4.0–10.5)

## 2023-08-01 LAB — COMPREHENSIVE METABOLIC PANEL
ALT: 16 U/L (ref 0–35)
AST: 23 U/L (ref 0–37)
Albumin: 4.2 g/dL (ref 3.5–5.2)
Alkaline Phosphatase: 70 U/L (ref 39–117)
BUN: 16 mg/dL (ref 6–23)
CO2: 30 meq/L (ref 19–32)
Calcium: 9.4 mg/dL (ref 8.4–10.5)
Chloride: 98 meq/L (ref 96–112)
Creatinine, Ser: 0.83 mg/dL (ref 0.40–1.20)
GFR: 67.98 mL/min (ref >60.00)
Glucose, Bld: 88 mg/dL (ref 70–99)
Potassium: 4.1 meq/L (ref 3.5–5.1)
Sodium: 135 meq/L (ref 135–145)
Total Bilirubin: 0.4 mg/dL (ref 0.2–1.2)
Total Protein: 6.8 g/dL (ref 6.0–8.3)

## 2023-08-01 LAB — LIPID PANEL
Cholesterol: 128 mg/dL (ref 0–200)
HDL: 43.5 mg/dL (ref >39.00)
LDL Cholesterol: 41 mg/dL (ref 0–99)
NonHDL: 84.02
Total CHOL/HDL Ratio: 3
Triglycerides: 216 mg/dL — ABNORMAL HIGH (ref 0.0–149.0)
VLDL: 43.2 mg/dL — ABNORMAL HIGH (ref 0.0–40.0)

## 2023-08-01 LAB — HEMOGLOBIN A1C: Hgb A1c MFr Bld: 5.5 % (ref 4.6–6.5)

## 2023-08-01 LAB — TSH: TSH: 0.68 u[IU]/mL (ref 0.35–5.50)

## 2023-08-01 MED ORDER — OMEPRAZOLE 40 MG PO CPDR
40.0000 mg | DELAYED_RELEASE_CAPSULE | Freq: Every day | ORAL | 0 refills | Status: DC
Start: 2023-08-01 — End: 2024-01-30

## 2023-08-01 NOTE — Progress Notes (Signed)
HPI: [name] is a 77 y.o. female  with PMHx significant for HTN, HLD, GERD, scoliosis, chronic back pain, and PreDM, who is here today for her routine physical.  Last CPE: 08/28/2021  Exercise: Not exercising regularly.  Diet: Eats home cooked meals, makes sandwiches.  Sleep: 5.5 hours a night.  Smoking: Quit smoking 1985 Alcohol consumption: N/a Dental: UTD with routine dental care. Next appt is next month.  Vision: UTD on routine vision exams. Last appt was around May-June.   Immunization History  Administered Date(s) Administered   Fluad Quad(high Dose 65+) 08/06/2019, 08/28/2021, 08/13/2022   Fluad Trivalent(High Dose 65+) 08/01/2023   Influenza, High Dose Seasonal PF 08/31/2016, 09/07/2017, 07/24/2018   PFIZER(Purple Top)SARS-COV-2 Vaccination 01/05/2020, 01/30/2020, 11/03/2020   PNEUMOCOCCAL CONJUGATE-20 04/11/2022   Pneumococcal Conjugate-13 01/22/2019   Pneumococcal Polysaccharide-23 07/06/2020   Tdap 11/16/2015, 08/04/2022   Zoster Recombinant(Shingrix) 11/25/2020, 04/06/2021   Health Maintenance  Topic Date Due   COVID-19 Vaccine (4 - 2023-24 season) 08/17/2023 (Originally 07/17/2023)   Medicare Annual Wellness (AWV)  07/26/2024   DTaP/Tdap/Td (3 - Td or Tdap) 08/04/2032   Pneumonia Vaccine 8+ Years old  Completed   INFLUENZA VACCINE  Completed   DEXA SCAN  Completed   Hepatitis C Screening  Completed   Zoster Vaccines- Shingrix  Completed   HPV VACCINES  Aged Out   Colonoscopy  Discontinued   Osteopenia with high FRAX score: She is on Fosamax 70 mg weekly, she is not sure for how long she been taking it. Last DEXA in 05/2019. Major Osteoporotic Fracture: 22.1% Hip Fracture:                5.7%  Hyperlipidemia: Currently on pravastatin 20 mg daily.  Tolerating her statin well.   Lab Results  Component Value Date   CHOL 140 02/24/2021   HDL 47.70 02/24/2021   LDLCALC 65 02/24/2021   TRIG 135.0 02/24/2021   CHOLHDL 3 02/24/2021    Prediabetes:Negative for polydipsia,polyuria, or polyphagia. Lab Results  Component Value Date   HGBA1C 5.6 09/02/2021   Upon review of systems she mentions abdominal pain. Epigastric pain, starting a couple of months ago.  These episodes tend to occur after eating a meal and last about 30-45 minutes.  GERD:She has been taking omeprazole 20 mg daily.  No specific food triggers. No nausea or heartburn.   Hypertension:  Medications: On Amlodipine 2.5 mg daily and losartan 50 mg daily BP readings at home: 130s/70s Negative for unusual or severe headache, visual changes, exertional chest pain, dyspnea,  focal weakness, or edema. Lab Results  Component Value Date   CREATININE 0.83 12/07/2021   BUN 16 12/07/2021   NA 135 12/07/2021   K 4.1 12/07/2021   CL 99 12/07/2021   CO2 29 12/07/2021   Osteoarthritis/Chronic back pain: Manages her chronic pain with Tramadol 50 mg bid, and Meloxicam 15 mg nightly.  On the tramadol she rates her pain a 5/10. Has had steroid injections in her back, 2 so far this year and will get a 3rd soon.  She states these steroid injections have been helpful in pain management.   She mentions that since her last visit she "jammed" her right thumb, seen by ortho for this problem in 01/2023.  Anxiety/Depression: She is on Citalopram 10 mg daily.  She feels like medication is still helping with these problems. She has tolerated medication well.    08/01/2023    2:05 PM 07/27/2023    3:08 PM 09/27/2022  3:03 PM 08/13/2022    3:32 PM 07/22/2022    2:06 PM  Depression screen PHQ 2/9  Decreased Interest 0 0 0 0 0  Down, Depressed, Hopeless 0 0 0 0 0  PHQ - 2 Score 0 0 0 0 0  Altered sleeping 0   0 0  Tired, decreased energy 0   0 0  Change in appetite 0   0 0  Feeling bad or failure about yourself  0   0 0  Trouble concentrating 0   0 0  Moving slowly or fidgety/restless 0   0 0  Suicidal thoughts 0   0 0  PHQ-9 Score 0   0 0  Difficult doing  work/chores Not difficult at all   Not difficult at all Not difficult at all      08/28/2021   10:01 PM 02/04/2020    5:55 PM  GAD 7 : Generalized Anxiety Score  Nervous, Anxious, on Edge 1 0  Control/stop worrying 1 0  Worry too much - different things 0 1  Trouble relaxing 0 0  Restless 0 0  Easily annoyed or irritable 0 1  Afraid - awful might happen 0 0  Total GAD 7 Score 2 2  Anxiety Difficulty Not difficult at all Not difficult at all   Requesting TSH to be done today due to hair loss, which has been going on for over a year. She has followed with dermatologist, Dr Tabitha Adams, 09/2022.  Review of Systems  Constitutional:  Positive for fatigue. Negative for activity change, appetite change and fever.  HENT:  Negative for mouth sores, sore throat and trouble swallowing.   Eyes:  Negative for redness and visual disturbance.  Respiratory:  Negative for cough, shortness of breath and wheezing.   Cardiovascular:  Negative for chest pain and leg swelling.  Gastrointestinal:  Positive for abdominal pain. Negative for blood in stool, nausea and vomiting.       No changes in bowel habits.  Endocrine: Negative for cold intolerance, heat intolerance, polydipsia, polyphagia and polyuria.  Genitourinary:  Negative for decreased urine volume, dysuria and hematuria.  Musculoskeletal:  Positive for arthralgias, back pain and gait problem.  Skin:  Negative for color change and rash.  Allergic/Immunologic: Positive for environmental allergies.  Neurological:  Negative for syncope, weakness and headaches.  Psychiatric/Behavioral:  Negative for confusion and hallucinations.   All other systems reviewed and are negative.  Current Outpatient Medications on File Prior to Visit  Medication Sig Dispense Refill   alendronate (FOSAMAX) 70 MG tablet TAKE 1 TABLET BY MOUTH WEEKLY  WITH 8 OZ OF PLAIN WATER 30  MINUTES BEFORE FIRST FOOD, DRINK OR MEDS. STAY UPRIGHT FOR 30  MINS 12 tablet 3   amLODipine  (NORVASC) 2.5 MG tablet TAKE 1 TABLET(2.5 MG) BY MOUTH AT BEDTIME 90 tablet 1   Apoaequorin (PREVAGEN EXTRA STRENGTH) 20 MG CAPS Take 1 tablet by mouth daily.     b complex vitamins capsule Take 1 capsule by mouth daily.     Calcium-Phosphorus-Vitamin D 250-100-500 MG-MG-UNIT CHEW Chew by mouth.     cetirizine (ZYRTEC) 10 MG tablet Take 10 mg by mouth at bedtime.     Cholecalciferol (VITAMIN D3) 50 MCG (2000 UT) TABS Take by mouth daily.     citalopram (CELEXA) 10 MG tablet TAKE 1 TABLET(10 MG) BY MOUTH DAILY 90 tablet 1   clobetasol cream (TEMOVATE) 0.05 % Bid on affected area for 14 days and then daily. 45 g 0  ferrous sulfate 325 (65 FE) MG tablet Take 325 mg by mouth daily with breakfast.     fluticasone (FLONASE) 50 MCG/ACT nasal spray SHAKE LIQUID AND USE 1 SPRAY IN EACH NOSTRIL TWICE DAILY AS NEEDED FOR ALLERGIES OR RHINITIS 16 g 11   glucosamine-chondroitin 500-400 MG tablet Take 1 tablet by mouth 2 (two) times daily.     losartan (COZAAR) 50 MG tablet TAKE 1 TABLET BY MOUTH DAILY 100 tablet 1   meclizine (ANTIVERT) 25 MG tablet TAKE 1/2 TO 1 TABLET BY  MOUTH TWICE DAILY AS NEEDED FOR DIZZINESS 90 tablet 1   meloxicam (MOBIC) 15 MG tablet TAKE 1 TABLET BY MOUTH DAILY 100 tablet 1   Multiple Vitamin (MULTIVITAMIN WITH MINERALS) TABS tablet Take 1 tablet by mouth daily.     polyethylene glycol (MIRALAX / GLYCOLAX) 17 g packet Take 17 g by mouth daily.     pravastatin (PRAVACHOL) 20 MG tablet TAKE 1 TABLET BY MOUTH DAILY 100 tablet 1   traMADol (ULTRAM) 50 MG tablet Take 1 tablet (50 mg total) by mouth every 12 (twelve) hours as needed. 60 tablet 0   vitamin C (VITAMIN C) 1000 MG tablet Take 1 tablet (1,000 mg total) by mouth daily. 30 tablet 0   No current facility-administered medications on file prior to visit.   Past Medical History:  Diagnosis Date   Allergy    Anemia    Arthritis    GERD (gastroesophageal reflux disease)    Hyperlipidemia     Past Surgical History:   Procedure Laterality Date   APPENDECTOMY     BACK SURGERY     BREAST CYST ASPIRATION Left    BREAST EXCISIONAL BIOPSY Left    BREAST EXCISIONAL BIOPSY Left    CATARACT EXTRACTION, BILATERAL     ORIF WRIST FRACTURE Right 01/25/2017   Procedure: OPEN REDUCTION INTERNAL FIXATION (ORIF) WRIST FRACTURE;  Surgeon: Dominica Severin, MD;  Location: [facility] OR;  Service: Orthopedics;  Laterality: Right;   TOTAL ABDOMINAL HYSTERECTOMY     heavy periods    Allergies  Allergen Reactions   Cymbalta [Duloxetine Hcl] Nausea Only    Adverse reaction caused dizziness and nausea   Elemental Sulfur Swelling and Other (See Comments)    Reaction:  All over body swelling    Penicillins Rash and Other (See Comments)    Has patient had a PCN reaction causing immediate rash, facial/tongue/throat swelling, SOB or lightheadedness with hypotension: No Has patient had a PCN reaction causing severe rash involving mucus membranes or skin necrosis: No Has patient had a PCN reaction that required hospitalization No Has patient had a PCN reaction occurring within the last 10 years: No If all of the above answers are "NO", then may proceed with Cephalosporin use.    Family History  Problem Relation Age of Onset   Other Mother        brain tumor, non cancerous   Stroke Father    Hypertension Father    Dementia Father    Arthritis Sister    Hypercholesterolemia Sister    Epilepsy Son    Heart Problems Son    Other Son        breathing problems   Tremor Son    Other Brother        MVA    Social History   Socioeconomic History   Marital status: Widowed    Spouse name: Not on file   Number of children: 3   Years of education: Not on file  Highest education level: Associate degree: occupational, Scientist, product/process development, or vocational program  Occupational History   Occupation: Lobbyist: [employer]    Comment: works 25-30hrs/week  Tobacco Use   Smoking status: Former    Current packs/day: 1.25    Average  packs/day: 1.3 packs/day for 57.7 years (72.1 ttl pk-yrs)    Types: Cigarettes    Start date: 1967   Smokeless tobacco: Never  Vaping Use   Vaping status: Never Used  Substance and Sexual Activity   Alcohol use: No   Drug use: No   Sexual activity: Not Currently    Comment: 1st intercourse 77 yo-Fewer than 5 partners  Other Topics Concern   Not on file  Social History Narrative   01/22/2019:   Widowed since 10/2017; lives alone now in [place] house with 2 dogs   Has one son, and one stepson and Museum/gallery conservator. Sons live close by, supportive.   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, nature, likes to stay busy   [employer] community support system   Social Determinants of Health   Financial Resource Strain: Medium Risk (07/31/2023)   Overall Financial Resource Strain (CARDIA)    Difficulty of Paying Living Expenses: Somewhat hard  Food Insecurity: No Food Insecurity (07/31/2023)   Hunger Vital Sign    Worried About Running Out of Food in the Last Year: Never true    Ran Out of Food in the Last Year: Never true  Transportation Needs: No Transportation Needs (07/31/2023)   PRAPARE - Administrator, Civil Service (Medical): No    Lack of Transportation (Non-Medical): No  Physical Activity: Insufficiently Active (07/31/2023)   Exercise Vital Sign    Days of Exercise per Week: 1 day    Minutes of Exercise per Session: 10 min  Stress: No Stress Concern Present (07/31/2023)   [employer] of Occupational Health - Occupational Stress Questionnaire    Feeling of Stress : Only a little  Social Connections: Moderately Integrated (07/31/2023)   Social Connection and Isolation Panel [NHANES]    Frequency of Communication with Friends and Family: More than three times a week    Frequency of Social Gatherings with Friends and Family: Twice a week    Attends Religious Services: More than 4 times per year    Active Member  of [employer] or Organizations: Yes    Attends Banker Meetings: More than 4 times per year    Marital Status: Widowed    Today's Vitals   08/01/23 1357  BP: 138/70  Pulse: 88  Resp: 16  Temp: 98.2 F (36.8 C)  TempSrc: Oral  SpO2: 98%  Weight: 137 lb 4 oz (62.3 kg)  Height: 4\' 10"  (1.473 m)   Body mass index is 28.69 kg/m.  Wt Readings from Last 3 Encounters:  08/01/23 137 lb 4 oz (62.3 kg)  07/27/23 134 lb (60.8 kg)  09/27/22 136 lb 6 oz (61.9 kg)   Physical Exam Vitals and nursing note reviewed.  Constitutional:      General: She is not in acute distress.    Appearance: She is well-developed.  HENT:     Head: Normocephalic and atraumatic.     Right Ear: Tympanic membrane, ear canal and external ear normal.     Left Ear: Tympanic membrane, ear canal and external ear normal.     Mouth/Throat:     Mouth: Mucous membranes are moist.  Pharynx: Oropharynx is clear. Uvula midline.  Eyes:     Conjunctiva/sclera: Conjunctivae normal.     Pupils: Pupils are equal, round, and reactive to light.  Neck:     Thyroid: No thyroid mass.  Cardiovascular:     Rate and Rhythm: Normal rate and regular rhythm.     Pulses:          Dorsalis pedis pulses are 2+ on the right side and 2+ on the left side.     Heart sounds: No murmur heard. Pulmonary:     Effort: Pulmonary effort is normal. No respiratory distress.     Breath sounds: Normal breath sounds.  Abdominal:     Palpations: Abdomen is soft. There is no hepatomegaly or mass.     Tenderness: There is no abdominal tenderness.  Genitourinary:    Comments: No concerns. Musculoskeletal:     Thoracic back: Scoliosis present.     Comments: No major deformity or signs of synovitis appreciated.  Lymphadenopathy:     Cervical: No cervical adenopathy.  Skin:    General: Skin is warm.     Findings: No erythema or rash.  Neurological:     General: No focal deficit present.     Mental Status: She is alert and oriented to  person, place, and time.     Cranial Nerves: No cranial nerve deficit.     Deep Tendon Reflexes:     Reflex Scores:      Bicep reflexes are 2+ on the right side and 2+ on the left side.      Patellar reflexes are 2+ on the right side and 2+ on the left side.    Comments: Antalgic gait, assisted with a stick.  Psychiatric:        Mood and Affect: Mood and affect normal.   ASSESSMENT AND PLAN: [name] was here today annual physical examination.  Orders Placed This Encounter  Procedures   Flu Vaccine Trivalent High Dose (Fluad)   Comprehensive metabolic panel   Lipid panel   CBC   TSH   Hemoglobin A1c   Lab Results  Component Value Date   HGBA1C 5.5 08/01/2023   Lab Results  Component Value Date   NA 135 08/01/2023   CL 98 08/01/2023   K 4.1 08/01/2023   CO2 30 08/01/2023   BUN 16 08/01/2023   CREATININE 0.83 08/01/2023   GFR 67.98 08/01/2023   CALCIUM 9.4 08/01/2023   ALBUMIN 4.2 08/01/2023   GLUCOSE 88 08/01/2023   Lab Results  Component Value Date   ALT 16 08/01/2023   AST 23 08/01/2023   ALKPHOS 70 08/01/2023   BILITOT 0.4 08/01/2023   Lab Results  Component Value Date   CHOL 128 08/01/2023   HDL 43.50 08/01/2023   LDLCALC 41 08/01/2023   TRIG 216.0 (H) 08/01/2023   CHOLHDL 3 08/01/2023   Lab Results  Component Value Date   WBC 5.6 08/01/2023   HGB 10.5 (L) 08/01/2023   HCT 31.5 (L) 08/01/2023   MCV 87.6 08/01/2023   PLT 289.0 08/01/2023   Lab Results  Component Value Date   TSH 0.68 08/01/2023   Routine general medical examination at a health care facility Assessment & Plan: We discussed the importance of regular physical activity and healthy diet for prevention of chronic illness and/or complications. Preventive guidelines reviewed. Vaccination up to date. Ca++ and vit D supplementation to continue. Next CPE in a year.   Essential hypertension, benign Assessment & Plan:  BP is adequately controlled. Continue Amlodipine  2.5 mg daily and Losartan 50 mg daily. Continue low salt diet. Eye exam is current.  Orders: -     Comprehensive metabolic panel; Future -     CBC; Future -     TSH; Future  Hyperlipidemia, unspecified hyperlipidemia type Assessment & Plan: Continue Pravastatin 20 mg daily and low fat diet. Further recommendations according to lipid panel result.  Orders: -     Comprehensive metabolic panel; Future -     Lipid panel; Future  Chronic pain disorder Assessment & Plan: Pain is adequately controlled, she is able to function with current regimen. Continue tramadol 50 mg twice daily as needed. Son side effects discussed.  PMP reviewed. Med contract signed today. Follow-up in 6 months.  Prediabetes Assessment & Plan: Last hemoglobin A1c 5.6 in 08/2021. Encouraged a healthy life style for diabetes prevention. Further recommendation will be given according to hemoglobin A1c result.  Orders: -     Hemoglobin A1c; Future  Mild recurrent major depression (HCC) Assessment & Plan: Reports problem as well controlled, would like to continue celexa 10 mg daily.   Need for influenza vaccination -     Flu Vaccine Trivalent High Dose (Fluad)  Epigastric abdominal pain We discussed possible etiologies. ?  Dyspepsia, gastritis, and PUD to be considered. She agrees with increasing dose of omeprazole from 20 mg to 40 mg daily. Instructed about warning signs. History of mild anemia, if worse, will consider GI referral; also if pain is persistent.  Osteopenia, unspecified location Assessment & Plan: Last DEXA in 2020. Currently on Fosamax 70 mg weekly. Major Osteoporotic Fracture: 22.1% Hip Fracture:                5.7% DEXA for follow up will be arranged. Continue adequate calcium and vitamin D intake as well as fall prevention.  Orders: -     DG Bone Density; Future  Other orders -     Omeprazole; Take 1 capsule (40 mg total) by mouth daily.  Dispense: 90 capsule; Refill:  0  Return in 6 months (on 01/29/2024) for chronic problems.  I,Rachel Rivera,acting as a scribe for Margrette Wynia [place], MD.,have documented all relevant documentation on the behalf of Deo Mehringer [place], MD,as directed by  Arvie Villarruel [place], MD while in the presence of Leandra Vanderweele [place], MD.  I, Isabelle Course, have reviewed all documentation for this visit. The documentation on 08/01/23 for the exam, diagnosis, procedures, and orders are all accurate and complete.   Chanc Kervin G. [place], MD  [facility]. [place] office.

## 2023-08-01 NOTE — Patient Instructions (Addendum)
A few things to remember from today's visit:  Routine general medical examination at a health care facility  Essential hypertension, benign - Plan: Comprehensive metabolic panel, CBC, TSH  Hyperlipidemia, unspecified hyperlipidemia type - Plan: Comprehensive metabolic panel, Lipid panel  Chronic pain disorder  Prediabetes - Plan: Hemoglobin A1c  Mild recurrent major depression (HCC), Chronic  Need for influenza vaccination - Plan: Flu Vaccine Trivalent High Dose (Fluad)  Epigastric abdominal pain  Increase Omeprazole from 20 mg to 40 mg. If pain does not resolve, the next step will be gastro evaluation.  If you need refills for medications you take chronically, please call your pharmacy. Do not use My Chart to request refills or for acute issues that need immediate attention. If you send a my chart message, it may take a few days to be addressed, specially if I am not in the office.  Please be sure medication list is accurate. If a new problem present, please set up appointment sooner than planned today.

## 2023-08-01 NOTE — Assessment & Plan Note (Signed)
Last hemoglobin A1c 5.6 in 08/2021. Encouraged a healthy life style for diabetes prevention. Further recommendation will be given according to hemoglobin A1c result.

## 2023-08-01 NOTE — Assessment & Plan Note (Signed)
Continue Pravastatin 20 mg daily and low fat diet. Further recommendations according to lipid panel result.

## 2023-08-01 NOTE — Assessment & Plan Note (Signed)
We discussed the importance of regular physical activity and healthy diet for prevention of chronic illness and/or complications. Preventive guidelines reviewed. Vaccination up to date. Ca++ and vit D supplementation to continue. Next CPE in a year.

## 2023-08-01 NOTE — Assessment & Plan Note (Signed)
Reports problem as well controlled, would like to continue celexa 10 mg daily.

## 2023-08-01 NOTE — Assessment & Plan Note (Signed)
BP is adequately controlled. Continue Amlodipine 2.5 mg daily and Losartan 50 mg daily. Continue low salt diet. Eye exam is current.

## 2023-08-01 NOTE — Assessment & Plan Note (Signed)
Pain is adequately controlled, she is able to function with current regimen. Continue tramadol 50 mg twice daily as needed. Son side effects discussed.  PMP reviewed. Med contract signed today. Follow-up in 6 months.

## 2023-08-02 DIAGNOSIS — M9903 Segmental and somatic dysfunction of lumbar region: Secondary | ICD-10-CM | POA: Diagnosis not present

## 2023-08-02 DIAGNOSIS — M5136 Other intervertebral disc degeneration, lumbar region: Secondary | ICD-10-CM | POA: Diagnosis not present

## 2023-08-02 NOTE — Assessment & Plan Note (Signed)
Last DEXA in 2020. Currently on Fosamax 70 mg weekly. Major Osteoporotic Fracture: 22.1% Hip Fracture:                5.7% DEXA for follow up will be arranged. Continue adequate calcium and vitamin D intake as well as fall prevention.

## 2023-08-08 ENCOUNTER — Other Ambulatory Visit: Payer: Self-pay | Admitting: Family Medicine

## 2023-08-08 DIAGNOSIS — I10 Essential (primary) hypertension: Secondary | ICD-10-CM

## 2023-08-22 ENCOUNTER — Other Ambulatory Visit: Payer: Self-pay | Admitting: Family Medicine

## 2023-08-22 DIAGNOSIS — G894 Chronic pain syndrome: Secondary | ICD-10-CM

## 2023-08-22 DIAGNOSIS — M159 Polyosteoarthritis, unspecified: Secondary | ICD-10-CM

## 2023-08-23 NOTE — Telephone Encounter (Signed)
Last filled 07/28/23

## 2023-09-05 DIAGNOSIS — M5416 Radiculopathy, lumbar region: Secondary | ICD-10-CM | POA: Diagnosis not present

## 2023-09-06 ENCOUNTER — Other Ambulatory Visit: Payer: Self-pay | Admitting: Family Medicine

## 2023-09-17 ENCOUNTER — Other Ambulatory Visit: Payer: Self-pay | Admitting: Family Medicine

## 2023-09-17 DIAGNOSIS — E785 Hyperlipidemia, unspecified: Secondary | ICD-10-CM

## 2023-09-19 ENCOUNTER — Other Ambulatory Visit (INDEPENDENT_AMBULATORY_CARE_PROVIDER_SITE_OTHER): Payer: Medicare Other

## 2023-09-19 ENCOUNTER — Encounter: Payer: Self-pay | Admitting: Gastroenterology

## 2023-09-19 ENCOUNTER — Ambulatory Visit: Payer: Medicare Other | Admitting: Gastroenterology

## 2023-09-19 VITALS — BP 135/70 | HR 81 | Ht <= 58 in | Wt 132.2 lb

## 2023-09-19 DIAGNOSIS — K219 Gastro-esophageal reflux disease without esophagitis: Secondary | ICD-10-CM

## 2023-09-19 DIAGNOSIS — D649 Anemia, unspecified: Secondary | ICD-10-CM

## 2023-09-19 LAB — IBC + FERRITIN
Ferritin: 118.8 ng/mL (ref 10.0–291.0)
Iron: 61 ug/dL (ref 42–145)
Saturation Ratios: 20.7 % (ref 20.0–50.0)
TIBC: 294 ug/dL (ref 250.0–450.0)
Transferrin: 210 mg/dL — ABNORMAL LOW (ref 212.0–360.0)

## 2023-09-19 LAB — VITAMIN B12: Vitamin B-12: 1386 pg/mL — ABNORMAL HIGH (ref 211–911)

## 2023-09-19 LAB — FOLATE: Folate: >24.2 ng/mL (ref >5.9)

## 2023-09-19 MED ORDER — FAMOTIDINE 20 MG PO TABS: 20.0000 mg | ORAL_TABLET | Freq: Every day | ORAL | 11 refills | Status: AC

## 2023-09-19 NOTE — Patient Instructions (Addendum)
Your provider has requested that you go to the basement level for lab work before leaving today. Press "B" on the elevator. The lab is located at the first door on the left as you exit the elevator.  Continue omeprazole daily.   We have sent the following medications to your pharmacy for you to pick up at your convenience: famotidine 20 mg to take one tablet by mouth before dinner.   The [facility] providers would like to encourage you to use [facility] to communicate with providers for non-urgent requests or questions.  Due to long hold times on the telephone, sending your provider a message by [facility] may be a faster and more efficient way to get a response.  Please allow 48 business hours for a response.  Please remember that this is for non-urgent requests.   Due to recent changes in healthcare laws, you may see the results of your imaging and laboratory studies on MyChart before your provider has had a chance to review them.  We understand that in some cases there may be results that are confusing or concerning to you. Not all laboratory results come back in the same time frame and the provider may be waiting for multiple results in order to interpret others.  Please give [place] 48 hours in order for your provider to thoroughly review all the results before contacting the office for clarification of your results.   Thank you for choosing me and [facility].  Venita Lick. Pleas Koch., MD., [facility]

## 2023-09-19 NOTE — Progress Notes (Signed)
    Assessment     Epigastric pain - R/O gastritis, ulcer, GERD, esophagitis (reflux, Fosamax) Normocytic anemia, history of IDA GERD CIC Scoliosis, chronic back pain   Recommendations    Continue omeprazole 40 mg qd, start famotidine 20 mg qpm. If epigastric pain persists proceed with EGD and CT AP Fe, TIBC, B12, folate today Continue omeprazole 40 mg qd Continue Miralax qd REV in 1 month   HPI    This is a 77 year old female with epigastric pain and anemia.  Intermittent pain following her evening meal for the past 2-3 months lasts a few minutes up to an hour.  Symptoms increase after a larger evening meal. Takes Fosamax weekly and Mobic daily hs. Fe prescribed but not taking as it causes dyspepsia. Omeprazole recently increased to 40 mg every day from 20 mg qd. Mild reflux symptoms abated after increasing omeprazole to 40 mg.  FOBT negative in Feb 2023.  Denies weight loss, diarrhea, change in stool caliber, melena, hematochezia, nausea, vomiting, dysphagia, chest pain.   EGD June 2017 Mild gastritis - HP negative Gastric polyp - benign fundic gland polyp   Colonoscopy June 2017 Sigmoid diverticulosis Asc colon lipoma Hemorrhoids    Labs / Imaging       Latest Ref Rng & Units 08/01/2023    2:36 PM 09/02/2021   11:41 AM 08/06/2020    3:34 PM  Hepatic Function  Total Protein 6.0 - 8.3 g/dL 6.8  6.7  6.3   Albumin 3.5 - 5.2 g/dL 4.2  4.4    AST 0 - 37 U/L 23  21  23    ALT 0 - 35 U/L 16  20  20    Alk Phosphatase 39 - 117 U/L 70  66    Total Bilirubin 0.2 - 1.2 mg/dL 0.4  0.5  0.4        Latest Ref Rng & Units 08/01/2023    2:36 PM 12/07/2021    4:12 PM 02/24/2021    7:52 AM  CBC  WBC 4.0 - 10.5 K/uL 5.6  6.1  5.4   Hemoglobin 12.0 - 15.0 g/dL 65.7  84.6  96.2   Hematocrit 36.0 - 46.0 % 31.5  33.1  35.3   Platelets 150.0 - 400.0 K/uL 289.0  317.0  297.0    Current Medications, Allergies, Past Medical History, Past Surgical History, Family History and Social  History were reviewed in [employer] record.   Physical Exam: General: Well developed, well nourished, no acute distress Head: Normocephalic and atraumatic Eyes: Sclerae anicteric, EOMI Ears: Normal auditory acuity Mouth: No deformities or lesions noted Lungs: Clear throughout to auscultation Heart: Regular rate and rhythm; No murmurs, rubs or bruits Abdomen: Soft, mild epigastric tenderness and non distended. No masses, hepatosplenomegaly or hernias noted. Normal Bowel sounds Rectal: Not done Musculoskeletal: Scoliosis   Pulses:  Normal pulses noted Extremities: No edema or deformities noted Neurological: Alert oriented x 4, grossly nonfocal Psychological:  Alert and cooperative. Normal mood and affect   Tabitha Shoun T. Russella Dar, MD 09/19/2023, 2:50 PM

## 2023-09-20 ENCOUNTER — Other Ambulatory Visit: Payer: Self-pay

## 2023-09-20 DIAGNOSIS — D649 Anemia, unspecified: Secondary | ICD-10-CM

## 2023-09-26 DIAGNOSIS — M9903 Segmental and somatic dysfunction of lumbar region: Secondary | ICD-10-CM | POA: Diagnosis not present

## 2023-09-26 DIAGNOSIS — M5136 Other intervertebral disc degeneration, lumbar region with discogenic back pain only: Secondary | ICD-10-CM | POA: Diagnosis not present

## 2023-09-27 DIAGNOSIS — M5416 Radiculopathy, lumbar region: Secondary | ICD-10-CM | POA: Diagnosis not present

## 2023-10-16 DIAGNOSIS — D500 Iron deficiency anemia secondary to blood loss (chronic): Secondary | ICD-10-CM | POA: Insufficient documentation

## 2023-10-16 NOTE — Assessment & Plan Note (Addendum)
Referred from Dr. Russella Dar, [facility]. Patient was referred to GI from her primary care due to epigastric pain and mild normocytic anemia. 07/30/2023  -CBC showing hemoglobin 10.5 and hematocrit 31.5. 09/19/2023 -iron was 61, transferrin decreased at 210.0; saturation ratio 20.7; ferritin 118; TIBC 294; folate normal; B12 high normal.  She is likely on B12 supplementation. Most recent colonoscopy done 05/04/2016 noting benign lipoma, internal hemorrhoids, and diverticulosis. Recommendations per initial GI consult recommended she continue omeprazole and added Pepcid twice daily.  Will follow-up in 1 month.  If epigastric discomfort is persistent then EGD and CT abdomen pelvis will be pursued. 10/18/2023 -discussed different causes of anemia with patient which include chronic blood loss, chronic disease (CKD), and myelo dysplastic diseases. -Check labs today including CBC, CMP, iron and TIBC, reticulocytes, kappa lambda light chains, multiple myeloma panel, and ferritin.  Will contact patient with lab results and plan for future treatment once those results are available.

## 2023-10-16 NOTE — Progress Notes (Unsigned)
[facility]   Telephone:([phone]) [phone] [phone]   Clinic New consult Note   Patient Care Team: [place], [name], MD as PCP - General (Family Medicine) Blima Ledger, OD (Optometry) Verner Chol, [facility] (Inactive) as Pharmacist (Pharmacist) 10/18/2023  CHIEF COMPLAINTS/PURPOSE OF CONSULTATION:  Anemia  HISTORY OF PRESENTING ILLNESS:  Tabitha Adams 77 y.o. female is here because of normocytic anemia. Patient was referred to GI from her primary care due to epigastric pain and mild normocytic anemia. 07/30/2023  -CBC showing hemoglobin 10.5 and hematocrit 31.5. 09/19/2023 -iron was 61; transferrin decreased at 210.0; saturation ratio 20.7; ferritin 118; TIBC 294; folate normal; B12 high normal.  She is likely on B12 supplementation. Most recent colonoscopy done 05/04/2016 noting benign lipoma, internal hemorrhoids, and diverticulosis. Recommendations per initial GI consult recommended she continue omeprazole and added Pepcid twice daily.  Will follow-up in 1 month.  If epigastric discomfort is persistent then EGD and CT abdomen pelvis will be pursued. The patient currently lives alone with her dog.  She is a non-smoker, though she does have history of smoking.  She quit in 1985.  She does not drink alcohol.  She has a history of mild hypertension and mild depression.  She does not have history of cancer.  Reports her sister was recently diagnosed with breast cancer.  States her grandfather had throat cancer.  She denies other family history of cancer or blood disorders.  She works part-time at [employer], approximately 4 to 5 hours/day.  She has 3 granddaughters that live in [place]. She denies chest pain, chest pressure, or shortness of breath. She denies headaches or visual disturbances. She denies abdominal pain, nausea, vomiting, or changes in bowel or bladder habits.  She denies recent nosebleeds, blood in the stool, or hematemesis.  She was found to have  abnormal CBC from 07/30/2023 She denies recent chest pain on exertion, shortness of breath on minimal exertion, pre-syncopal episodes, or palpitations. She had not noticed any recent bleeding such as epistaxis, hematuria or hematochezia The patient denies over the counter NSAID ingestion. She is not  on antiplatelets agents. Her last colonoscopy was 04/2016 She had no prior history or diagnosis of cancer. Her age appropriate screening programs are up-to-date. She denies any pica and eats a variety of diet. She never donated blood or received blood transfusion The patient was prescribed oral iron supplements and she takes    REVIEW OF SYSTEMS:   Constitutional: Denies fevers, chills or abnormal night sweats.  Reports chronic fatigue.  States that she always feels tired.  Can take a nap at any point in time.  Also says she does not sleep very well. Eyes: Denies blurriness of vision, double vision or watery eyes Ears, nose, mouth, throat, and face: Denies mucositis or sore throat Respiratory: Denies cough, dyspnea or wheezes Cardiovascular: Denies palpitation, chest discomfort or lower extremity swelling Gastrointestinal:  Denies nausea, heartburn or change in bowel habits Skin: Denies abnormal skin rashes Lymphatics: Denies new lymphadenopathy or easy bruising Neurological:Denies numbness, tingling or new weaknesses Behavioral/Psych: Mood is stable, no new changes.  Mild depression since losing her husband. Musculoskeletal: Arthritis.  All other systems were reviewed with the patient and are negative.   MEDICAL HISTORY:  Past Medical History:  Diagnosis Date   Allergy    Anemia    Arthritis    GERD (gastroesophageal reflux disease)    Hyperlipidemia     SURGICAL HISTORY: Past Surgical History:  Procedure Laterality Date   APPENDECTOMY  BACK SURGERY     BREAST CYST ASPIRATION Left    BREAST EXCISIONAL BIOPSY Left    BREAST EXCISIONAL BIOPSY Left    CATARACT EXTRACTION,  BILATERAL     ORIF WRIST FRACTURE Right 01/25/2017   Procedure: OPEN REDUCTION INTERNAL FIXATION (ORIF) WRIST FRACTURE;  Surgeon: Dominica Severin, MD;  Location: [facility] OR;  Service: Orthopedics;  Laterality: Right;   TOTAL ABDOMINAL HYSTERECTOMY     heavy periods    SOCIAL HISTORY: Social History   Socioeconomic History   Marital status: Widowed    Spouse name: Not on file   Number of children: 3   Years of education: Not on file   Highest education level: Associate degree: occupational, Scientist, product/process development, or vocational program  Occupational History   Occupation: Lobbyist: [employer]    Comment: works 25-30hrs/week  Tobacco Use   Smoking status: Former    Types: Cigarettes   Smokeless tobacco: Never   Tobacco comments:    Smoked from [place] to 1985 max 1.25 PPD  Vaping Use   Vaping status: Never Used  Substance and Sexual Activity   Alcohol use: No   Drug use: No   Sexual activity: Not Currently    Comment: 1st intercourse 77 yo-Fewer than 5 partners  Other Topics Concern   Not on file  Social History Narrative   01/22/2019:   Widowed since 10/2017; lives alone now in [place] house with 2 dogs   Has one son, and one stepson and Museum/gallery conservator. Sons live close by, supportive.   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, nature, likes to stay busy   [employer] community support system   Social Determinants of Health   Financial Resource Strain: Medium Risk (07/31/2023)   Overall Financial Resource Strain (CARDIA)    Difficulty of Paying Living Expenses: Somewhat hard  Food Insecurity: No Food Insecurity (07/31/2023)   Hunger Vital Sign    Worried About Running Out of Food in the Last Year: Never true    Ran Out of Food in the Last Year: Never true  Transportation Needs: No Transportation Needs (07/31/2023)   PRAPARE - Administrator, Civil Service (Medical): No    Lack of Transportation (Non-Medical): No   Physical Activity: Insufficiently Active (07/31/2023)   Exercise Vital Sign    Days of Exercise per Week: 1 day    Minutes of Exercise per Session: 10 min  Stress: No Stress Concern Present (07/31/2023)   [employer] of Occupational Health - Occupational Stress Questionnaire    Feeling of Stress : Only a little  Social Connections: Moderately Integrated (07/31/2023)   Social Connection and Isolation Panel [NHANES]    Frequency of Communication with Friends and Family: More than three times a week    Frequency of Social Gatherings with Friends and Family: Twice a week    Attends Religious Services: More than 4 times per year    Active Member of [employer] or Organizations: Yes    Attends Banker Meetings: More than 4 times per year    Marital Status: Widowed  Intimate Partner Violence: Not At Risk (07/27/2023)   Humiliation, Afraid, Rape, and Kick questionnaire    Fear of Current or Ex-Partner: No    Emotionally Abused: No    Physically Abused: No    Sexually Abused: No    FAMILY HISTORY: Family History  Problem Relation Age of Onset   Other Mother  brain tumor, non cancerous   Stroke Father    Hypertension Father    Dementia Father    Arthritis Sister    Hypercholesterolemia Sister    Epilepsy Son    Heart Problems Son    Other Son        breathing problems   Tremor Son    Other Brother        MVA    ALLERGIES:  is allergic to cymbalta [duloxetine hcl], elemental sulfur, and penicillins.  MEDICATIONS:  Current Outpatient Medications  Medication Sig Dispense Refill   alendronate (FOSAMAX) 70 MG tablet TAKE 1 TABLET BY MOUTH WEEKLY  WITH 8 OZ OF PLAIN WATER 30  MINUTES BEFORE FIRST FOOD, DRINK OR MEDS. STAY UPRIGHT FOR 30  MINS 12 tablet 3   amLODipine (NORVASC) 2.5 MG tablet TAKE 1 TABLET(2.5 MG) BY MOUTH AT BEDTIME 90 tablet 1   Apoaequorin (PREVAGEN EXTRA STRENGTH) 20 MG CAPS Take 1 tablet by mouth daily.     b complex vitamins capsule Take 1  capsule by mouth daily.     Calcium-Phosphorus-Vitamin D 250-100-500 MG-MG-UNIT CHEW Chew by mouth.     cetirizine (ZYRTEC) 10 MG tablet Take 10 mg by mouth at bedtime.     Cholecalciferol (VITAMIN D3) 50 MCG (2000 UT) TABS Take by mouth daily.     citalopram (CELEXA) 10 MG tablet TAKE 1 TABLET(10 MG) BY MOUTH DAILY 90 tablet 1   clobetasol cream (TEMOVATE) 0.05 % Bid on affected area for 14 days and then daily. 45 g 0   famotidine (PEPCID) 20 MG tablet Take 1 tablet (20 mg total) by mouth daily. Before dinner 30 tablet 11   ferrous sulfate 325 (65 FE) MG tablet Take 325 mg by mouth daily with breakfast.     fluticasone (FLONASE) 50 MCG/ACT nasal spray SHAKE LIQUID AND USE 1 SPRAY IN EACH NOSTRIL TWICE DAILY AS NEEDED FOR ALLERGIES OR RHINITIS 16 g 11   glucosamine-chondroitin 500-400 MG tablet Take 1 tablet by mouth 2 (two) times daily.     losartan (COZAAR) 50 MG tablet TAKE 1 TABLET BY MOUTH DAILY 100 tablet 1   meclizine (ANTIVERT) 25 MG tablet TAKE 1/2 TO 1 TABLET BY  MOUTH TWICE DAILY AS NEEDED FOR DIZZINESS 90 tablet 1   meloxicam (MOBIC) 15 MG tablet TAKE 1 TABLET BY MOUTH DAILY 100 tablet 1   Multiple Vitamin (MULTIVITAMIN WITH MINERALS) TABS tablet Take 1 tablet by mouth daily.     omeprazole (PRILOSEC) 40 MG capsule Take 1 capsule (40 mg total) by mouth daily. 90 capsule 0   polyethylene glycol (MIRALAX / GLYCOLAX) 17 g packet Take 17 g by mouth daily.     pravastatin (PRAVACHOL) 20 MG tablet TAKE 1 TABLET BY MOUTH DAILY 100 tablet 2   traMADol (ULTRAM) 50 MG tablet Take 1 tablet (50 mg total) by mouth every 12 (twelve) hours as needed. 60 tablet 3   vitamin C (VITAMIN C) 1000 MG tablet Take 1 tablet (1,000 mg total) by mouth daily. 30 tablet 0   No current facility-administered medications for this visit.    PHYSICAL EXAMINATION: ECOG PERFORMANCE STATUS: 1 - Symptomatic but completely ambulatory  Vitals:   10/18/23 1428  BP: 139/68  Pulse: 90  Resp: 17  Temp: 97.6 F (36.4  C)  SpO2: 96%   Filed Weights   10/18/23 1428  Weight: 133 lb 3.2 oz (60.4 kg)    GENERAL:alert, no distress and comfortable SKIN: skin color, texture, turgor are normal,  no rashes or significant lesions EYES: normal, conjunctiva are pink and non-injected, sclera clear OROPHARYNX:no exudate, no erythema and lips, buccal mucosa, and tongue normal  NECK: supple, thyroid normal size, non-tender, without nodularity LYMPH:  no palpable lymphadenopathy in the cervical, axillary or inguinal LUNGS: clear to auscultation and percussion with normal breathing effort HEART: regular rate & rhythm and no murmurs and no lower extremity edema ABDOMEN:abdomen soft, non-tender and normal bowel sounds Musculoskeletal:no cyanosis of digits and no clubbing  PSYCH: alert & oriented x 3 with fluent speech NEURO: no focal motor/sensory deficits Musculoskeletal: moderate scoliosis. She does use a cane for mobility assistance.   LABORATORY DATA:  I have reviewed the data as listed    Latest Ref Rng & Units 08/01/2023    2:36 PM 12/07/2021    4:12 PM 02/24/2021    7:52 AM  CBC  WBC 4.0 - 10.5 K/uL 5.6  6.1  5.4   Hemoglobin 12.0 - 15.0 g/dL 53.6  64.4  03.4   Hematocrit 36.0 - 46.0 % 31.5  33.1  35.3   Platelets 150.0 - 400.0 K/uL 289.0  317.0  297.0        Latest Ref Rng & Units 08/01/2023    2:36 PM 12/07/2021    4:12 PM 09/02/2021   11:41 AM  CMP  Glucose 70 - 99 mg/dL 88  742  95   BUN 6 - 23 mg/dL 16  16  15    Creatinine 0.40 - 1.20 mg/dL 5.95  6.38  7.56   Sodium 135 - 145 mEq/L 135  135  134   Potassium 3.5 - 5.1 mEq/L 4.1  4.1  4.0   Chloride 96 - 112 mEq/L 98  99  98   CO2 19 - 32 mEq/L 30  29  28    Calcium 8.4 - 10.5 mg/dL 9.4  9.5  43.3   Total Protein 6.0 - 8.3 g/dL 6.8   6.7   Total Bilirubin 0.2 - 1.2 mg/dL 0.4   0.5   Alkaline Phos 39 - 117 U/L 70   66   AST 0 - 37 U/L 23   21   ALT 0 - 35 U/L 16   20     Iron deficiency anemia due to chronic blood loss Assessment &  Plan: Referred from Dr. Russella Dar, [facility]. Patient was referred to GI from her primary care due to epigastric pain and mild normocytic anemia. 07/30/2023  -CBC showing hemoglobin 10.5 and hematocrit 31.5. 09/19/2023 -iron was 61, transferrin decreased at 210.0; saturation ratio 20.7; ferritin 118; TIBC 294; folate normal; B12 high normal.  She is likely on B12 supplementation. Most recent colonoscopy done 05/04/2016 noting benign lipoma, internal hemorrhoids, and diverticulosis. Recommendations per initial GI consult recommended she continue omeprazole and added Pepcid twice daily.  Will follow-up in 1 month.  If epigastric discomfort is persistent then EGD and CT abdomen pelvis will be pursued. 10/18/2023 -discussed different causes of anemia with patient which include chronic blood loss, chronic disease (CKD), and myelo dysplastic diseases. -Check labs today including CBC, CMP, iron and TIBC, reticulocytes, kappa lambda light chains, multiple myeloma panel, and ferritin.  Will contact patient with lab results and plan for future treatment once those results are available.  Orders: -     CBC (Cancer Center Only); Future -     CMP (Cancer Center only); Future -     Kappa/lambda light chains; Future -     Multiple Myeloma Panel (SPEP&IFE w/QIG); Future -  Reticulocytes; Future -     Folate; Future -     Save Smear for Provider Slide Review; Future     Orders Placed This Encounter  Procedures   CBC (Cancer Center Only)    Standing Status:   Future    Number of Occurrences:   1    Standing Expiration Date:   10/17/2024   CMP (Cancer Center only)    Standing Status:   Future    Number of Occurrences:   1    Standing Expiration Date:   10/17/2024   Kappa/lambda light chains    Standing Status:   Future    Number of Occurrences:   1    Standing Expiration Date:   10/17/2024   Multiple Myeloma Panel (SPEP&IFE w/QIG)    Standing Status:   Future    Number of Occurrences:   1    Standing  Expiration Date:   10/17/2024   Reticulocytes    Standing Status:   Future    Number of Occurrences:   1    Standing Expiration Date:   10/17/2024   Folate, Serum    Standing Status:   Future    Number of Occurrences:   1    Standing Expiration Date:   10/17/2024   Save Smear for Provider Slide Review    Standing Status:   Future    Number of Occurrences:   1    Standing Expiration Date:   10/17/2024    All questions were answered. The patient knows to call the clinic with any problems, questions or concerns. The total time spent in the appointment was 30 minutes.     Carlean Jews, NP 10/18/2023 3:56 PM  Addendum I have seen the patient, examined her. I agree with the assessment and and plan and have edited the notes.   77 yo female with PMH of hypertension and depression, was referred for normocytic anemia.  Her anemia was mild, she has been seen by GI due to epigastric pain and anemia.  Plan to have upper endoscopy if her GI symptoms persists.  Recent iron study showed mildly low transferrin saturation, normal ferritin and B12.  Will repeat her CBC and iron study, folate, methylmalonic acid level, SPEP with immunofixation and light chain level, will call her with lab results.  If lab work is unremarkable and she has persistent anemia, would also consider bone marrow biopsy to rule out MDS.  All questions were answered.  I spent a total of 30 minutes for her visit today, and more than 50% time on face-to-face counseling.  Malachy Mood MD 10/18/2023

## 2023-10-18 ENCOUNTER — Encounter: Payer: Self-pay | Admitting: Nurse Practitioner

## 2023-10-18 ENCOUNTER — Inpatient Hospital Stay: Payer: Medicare Other | Admitting: Nurse Practitioner

## 2023-10-18 ENCOUNTER — Inpatient Hospital Stay: Payer: Medicare Other | Attending: Nurse Practitioner | Admitting: Nurse Practitioner

## 2023-10-18 VITALS — BP 139/68 | HR 90 | Temp 97.6°F | Resp 17 | Wt 133.2 lb

## 2023-10-18 DIAGNOSIS — D500 Iron deficiency anemia secondary to blood loss (chronic): Secondary | ICD-10-CM

## 2023-10-18 DIAGNOSIS — Z87891 Personal history of nicotine dependence: Secondary | ICD-10-CM | POA: Insufficient documentation

## 2023-10-18 DIAGNOSIS — Z800 Family history of malignant neoplasm of digestive organs: Secondary | ICD-10-CM | POA: Diagnosis not present

## 2023-10-18 DIAGNOSIS — D649 Anemia, unspecified: Secondary | ICD-10-CM | POA: Diagnosis not present

## 2023-10-18 DIAGNOSIS — Z803 Family history of malignant neoplasm of breast: Secondary | ICD-10-CM | POA: Insufficient documentation

## 2023-10-18 DIAGNOSIS — Z8719 Personal history of other diseases of the digestive system: Secondary | ICD-10-CM | POA: Diagnosis not present

## 2023-10-18 LAB — CMP (CANCER CENTER ONLY)
ALT: 18 U/L (ref 0–44)
AST: 23 U/L (ref 15–41)
Albumin: 4.3 g/dL (ref 3.5–5.0)
Alkaline Phosphatase: 75 U/L (ref 38–126)
Anion gap: 5 (ref 5–15)
BUN: 13 mg/dL (ref 8–23)
CO2: 31 mmol/L (ref 22–32)
Calcium: 9.9 mg/dL (ref 8.9–10.3)
Chloride: 103 mmol/L (ref 98–111)
Creatinine: 0.78 mg/dL (ref 0.44–1.00)
GFR, Estimated: >60 mL/min (ref >60)
Glucose, Bld: 95 mg/dL (ref 70–99)
Potassium: 4.1 mmol/L (ref 3.5–5.1)
Sodium: 139 mmol/L (ref 135–145)
Total Bilirubin: 0.3 mg/dL (ref <1.2)
Total Protein: 6.8 g/dL (ref 6.5–8.1)

## 2023-10-18 LAB — CBC (CANCER CENTER ONLY)
HCT: 33 % — ABNORMAL LOW (ref 36.0–46.0)
Hemoglobin: 11 g/dL — ABNORMAL LOW (ref 12.0–15.0)
MCH: 28.9 pg (ref 26.0–34.0)
MCHC: 33.3 g/dL (ref 30.0–36.0)
MCV: 86.6 fL (ref 80.0–100.0)
Platelet Count: 275 10*3/uL (ref 150–400)
RBC: 3.81 MIL/uL — ABNORMAL LOW (ref 3.87–5.11)
RDW: 13 % (ref 11.5–15.5)
WBC Count: 5.9 10*3/uL (ref 4.0–10.5)
nRBC: 0 % (ref 0.0–0.2)

## 2023-10-18 LAB — IRON AND IRON BINDING CAPACITY (CC-WL,HP ONLY)
Iron: 56 ug/dL (ref 28–170)
Saturation Ratios: 19 % (ref 10.4–31.8)
TIBC: 297 ug/dL (ref 250–450)
UIBC: 241 ug/dL (ref 148–442)

## 2023-10-18 LAB — SAVE SMEAR(SSMR), FOR PROVIDER SLIDE REVIEW

## 2023-10-18 LAB — RETICULOCYTES
Immature Retic Fract: 4.6 % (ref 2.3–15.9)
RBC.: 3.7 MIL/uL — ABNORMAL LOW (ref 3.87–5.11)
Retic Count, Absolute: 41.1 10*3/uL (ref 19.0–186.0)
Retic Ct Pct: 1.1 % (ref 0.4–3.1)

## 2023-10-18 LAB — FOLATE: Folate: 39.5 ng/mL (ref >5.9)

## 2023-10-19 LAB — KAPPA/LAMBDA LIGHT CHAINS
Kappa free light chain: 18.6 mg/L (ref 3.3–19.4)
Kappa, lambda light chain ratio: 1.39 (ref 0.26–1.65)
Lambda free light chains: 13.4 mg/L (ref 5.7–26.3)

## 2023-10-19 NOTE — Progress Notes (Signed)
Mild anemia. Waiting for myeloma labs to return

## 2023-10-20 LAB — MULTIPLE MYELOMA PANEL, SERUM
Albumin SerPl Elph-Mcnc: 4.1 g/dL (ref 2.9–4.4)
Albumin/Glob SerPl: 1.8 — ABNORMAL HIGH (ref 0.7–1.7)
Alpha 1: 0.2 g/dL (ref 0.0–0.4)
Alpha2 Glob SerPl Elph-Mcnc: 0.6 g/dL (ref 0.4–1.0)
B-Globulin SerPl Elph-Mcnc: 0.8 g/dL (ref 0.7–1.3)
Gamma Glob SerPl Elph-Mcnc: 0.6 g/dL (ref 0.4–1.8)
Globulin, Total: 2.3 g/dL (ref 2.2–3.9)
IgA: 189 mg/dL (ref 64–422)
IgG (Immunoglobin G), Serum: 790 mg/dL (ref 586–1602)
IgM (Immunoglobulin M), Srm: 93 mg/dL (ref 26–217)
Total Protein ELP: 6.4 g/dL (ref 6.0–8.5)

## 2023-10-21 HISTORY — PX: TOOTH EXTRACTION: SUR596

## 2023-10-21 HISTORY — PX: OTHER SURGICAL HISTORY: SHX169

## 2023-10-25 ENCOUNTER — Ambulatory Visit: Payer: Medicare Other | Admitting: Gastroenterology

## 2023-10-25 ENCOUNTER — Encounter: Payer: Self-pay | Admitting: Gastroenterology

## 2023-10-25 VITALS — BP 116/58 | HR 83 | Ht <= 58 in | Wt 128.0 lb

## 2023-10-25 DIAGNOSIS — D649 Anemia, unspecified: Secondary | ICD-10-CM

## 2023-10-25 DIAGNOSIS — K5904 Chronic idiopathic constipation: Secondary | ICD-10-CM

## 2023-10-25 DIAGNOSIS — M549 Dorsalgia, unspecified: Secondary | ICD-10-CM | POA: Diagnosis not present

## 2023-10-25 DIAGNOSIS — R1013 Epigastric pain: Secondary | ICD-10-CM

## 2023-10-25 DIAGNOSIS — M419 Scoliosis, unspecified: Secondary | ICD-10-CM | POA: Diagnosis not present

## 2023-10-25 NOTE — Patient Instructions (Signed)
Remain on omeprazole 40 mg daily.   Follow up as needed.   The [facility] providers would like to encourage you to use [facility] to communicate with providers for non-urgent requests or questions.  Due to long hold times on the telephone, sending your provider a message by [facility] may be a faster and more efficient way to get a response.  Please allow 48 business hours for a response.  Please remember that this is for non-urgent requests.   Thank you for choosing me and [facility].  Venita Lick. Pleas Koch., MD., Clementeen Graham

## 2023-10-25 NOTE — Progress Notes (Signed)
    Assessment     Epigastric pain - suspected GERD Normocytic anemia, past history of IDA   CIC Scoliosis, chronic back pain   Recommendations    Continue omeprazole 40 mg qd, start famotidine 20 mg qpm.  If epigastric pain returns consider EGD and/or CT AP Continue Miralax qd Anemia evaluation per hematology REV with GI prn   HPI    This is a 77 year old female with epigastric pain likely due to gastritis or GERD.  Her substantially improved on omeprazole.  Her constipation is well-controlled on MiraLAX.  She was evaluated by hematology for her anemia.   Labs / Imaging       Latest Ref Rng & Units 10/18/2023    3:39 PM 08/01/2023    2:36 PM 09/02/2021   11:41 AM  Hepatic Function  Total Protein 6.5 - 8.1 g/dL 6.8  6.8  6.7   Albumin 3.5 - 5.0 g/dL 4.3  4.2  4.4   AST 15 - 41 U/L 23  23  21    ALT 0 - 44 U/L 18  16  20    Alk Phosphatase 38 - 126 U/L 75  70  66   Total Bilirubin <1.2 mg/dL 0.3  0.4  0.5        Latest Ref Rng & Units 10/18/2023    3:39 PM 08/01/2023    2:36 PM 12/07/2021    4:12 PM  CBC  WBC 4.0 - 10.5 K/uL 5.9  5.6  6.1   Hemoglobin 12.0 - 15.0 g/dL 40.9  81.1  91.4   Hematocrit 36.0 - 46.0 % 33.0  31.5  33.1   Platelets 150 - 400 K/uL 275  289.0  317.0    Current Medications, Allergies, Past Medical History, Past Surgical History, Family History and Social History were reviewed in [employer] record.   Physical Exam: General: Well developed, well nourished, no acute distress Head: Normocephalic and atraumatic Eyes: Sclerae anicteric, EOMI Ears: Normal auditory acuity Mouth: No deformities or lesions noted Lungs: Clear throughout to auscultation Heart: Regular rate and rhythm; No murmurs, rubs or bruits Abdomen: Soft, non tender and non distended. No masses, hepatosplenomegaly or hernias noted. Normal Bowel sounds Rectal: Not done Musculoskeletal: Symmetrical with no gross deformities  Pulses:  Normal pulses  noted Extremities: No edema or deformities noted Neurological: Alert oriented x 4, grossly nonfocal Psychological:  Alert and cooperative. Normal mood and affect   Romin Divita T. Russella Dar, MD 10/25/2023, 1:24 PM

## 2023-10-26 NOTE — Progress Notes (Signed)
Myeloma panel is normal.

## 2023-11-23 DIAGNOSIS — M9903 Segmental and somatic dysfunction of lumbar region: Secondary | ICD-10-CM | POA: Diagnosis not present

## 2023-11-23 DIAGNOSIS — M5136 Other intervertebral disc degeneration, lumbar region with discogenic back pain only: Secondary | ICD-10-CM | POA: Diagnosis not present

## 2023-12-06 DIAGNOSIS — M9903 Segmental and somatic dysfunction of lumbar region: Secondary | ICD-10-CM | POA: Diagnosis not present

## 2023-12-06 DIAGNOSIS — M5136 Other intervertebral disc degeneration, lumbar region with discogenic back pain only: Secondary | ICD-10-CM | POA: Diagnosis not present

## 2023-12-20 DIAGNOSIS — M5136 Other intervertebral disc degeneration, lumbar region with discogenic back pain only: Secondary | ICD-10-CM | POA: Diagnosis not present

## 2023-12-20 DIAGNOSIS — M9903 Segmental and somatic dysfunction of lumbar region: Secondary | ICD-10-CM | POA: Diagnosis not present

## 2023-12-28 DIAGNOSIS — M5416 Radiculopathy, lumbar region: Secondary | ICD-10-CM | POA: Diagnosis not present

## 2023-12-31 ENCOUNTER — Other Ambulatory Visit: Payer: Self-pay | Admitting: Family Medicine

## 2023-12-31 DIAGNOSIS — M159 Polyosteoarthritis, unspecified: Secondary | ICD-10-CM

## 2023-12-31 DIAGNOSIS — G894 Chronic pain syndrome: Secondary | ICD-10-CM

## 2024-01-01 ENCOUNTER — Other Ambulatory Visit: Payer: Self-pay | Admitting: Family Medicine

## 2024-01-01 DIAGNOSIS — I10 Essential (primary) hypertension: Secondary | ICD-10-CM

## 2024-01-01 DIAGNOSIS — K219 Gastro-esophageal reflux disease without esophagitis: Secondary | ICD-10-CM

## 2024-01-02 NOTE — Telephone Encounter (Signed)
Last filled 12/01/23, next OV 01/30/24

## 2024-01-09 ENCOUNTER — Other Ambulatory Visit: Payer: Self-pay | Admitting: Family Medicine

## 2024-01-09 DIAGNOSIS — F330 Major depressive disorder, recurrent, mild: Secondary | ICD-10-CM

## 2024-01-10 DIAGNOSIS — M9903 Segmental and somatic dysfunction of lumbar region: Secondary | ICD-10-CM | POA: Diagnosis not present

## 2024-01-10 DIAGNOSIS — M5136 Other intervertebral disc degeneration, lumbar region with discogenic back pain only: Secondary | ICD-10-CM | POA: Diagnosis not present

## 2024-01-11 ENCOUNTER — Telehealth: Payer: Self-pay | Admitting: Family Medicine

## 2024-01-11 NOTE — Telephone Encounter (Signed)
 She has a f/u on 3/17, okay to wait until then or do you want to move it up?

## 2024-01-11 NOTE — Telephone Encounter (Signed)
 Nurse wendy from house calls had a heart mumor she was not aware of right cartod burt also has mild pleural artery in left leg all of those needs further ev evaluation to confirm possible treatment and diagnoses

## 2024-01-11 NOTE — Telephone Encounter (Signed)
 I have not noted a heart murmur nor carotid bruit. As far as she is not having any new symptom, CP,SOB, or worsening dizziness (hx of vertigo), I think we can wait until 01/30/24. She is on statin at this time. Thanks, BJ

## 2024-01-11 NOTE — Telephone Encounter (Signed)
 I spoke with patient. We went over message below, she is not having any new symptoms. We will keep the appt for 3/17.

## 2024-01-18 DIAGNOSIS — M5416 Radiculopathy, lumbar region: Secondary | ICD-10-CM | POA: Diagnosis not present

## 2024-01-30 ENCOUNTER — Encounter: Payer: Self-pay | Admitting: Family Medicine

## 2024-01-30 ENCOUNTER — Ambulatory Visit (INDEPENDENT_AMBULATORY_CARE_PROVIDER_SITE_OTHER): Payer: Medicare Other | Admitting: Family Medicine

## 2024-01-30 VITALS — BP 128/80 | HR 85 | Resp 16 | Ht <= 58 in | Wt 131.0 lb

## 2024-01-30 DIAGNOSIS — G894 Chronic pain syndrome: Secondary | ICD-10-CM

## 2024-01-30 DIAGNOSIS — M545 Low back pain, unspecified: Secondary | ICD-10-CM | POA: Diagnosis not present

## 2024-01-30 DIAGNOSIS — I10 Essential (primary) hypertension: Secondary | ICD-10-CM

## 2024-01-30 DIAGNOSIS — D500 Iron deficiency anemia secondary to blood loss (chronic): Secondary | ICD-10-CM

## 2024-01-30 DIAGNOSIS — I739 Peripheral vascular disease, unspecified: Secondary | ICD-10-CM | POA: Diagnosis not present

## 2024-01-30 DIAGNOSIS — K219 Gastro-esophageal reflux disease without esophagitis: Secondary | ICD-10-CM

## 2024-01-30 DIAGNOSIS — M816 Localized osteoporosis [Lequesne]: Secondary | ICD-10-CM | POA: Diagnosis not present

## 2024-01-30 DIAGNOSIS — R0989 Other specified symptoms and signs involving the circulatory and respiratory systems: Secondary | ICD-10-CM | POA: Diagnosis not present

## 2024-01-30 DIAGNOSIS — F330 Major depressive disorder, recurrent, mild: Secondary | ICD-10-CM

## 2024-01-30 DIAGNOSIS — G8929 Other chronic pain: Secondary | ICD-10-CM

## 2024-01-30 MED ORDER — OMEPRAZOLE 40 MG PO CPDR: 40.0000 mg | DELAYED_RELEASE_CAPSULE | Freq: Every day | ORAL | 3 refills | Status: DC

## 2024-01-30 NOTE — Assessment & Plan Note (Signed)
 She has been on Fosamax 70 mg weekly since 2020. Continue current treatment until DEXA is done, ideally with her next mammogram in 06/2024. Fall precautions and adequate calcium and vit D supplementation to continue.

## 2024-01-30 NOTE — Assessment & Plan Note (Addendum)
 Otherwise stable. Continue Fe sulfate 325 mg daily. Colonoscopy 04/16/16. GI evaluation 10/2023.

## 2024-01-30 NOTE — Progress Notes (Signed)
 HPI: Tabitha Adams is a 78 y.o. female with a PMHx significant for HTN, HLD, GERD, scoliosis, chronic back pain, iron deficiency anemia, and prediabetes, who is here today for chronic disease management.  Last seen on 08/01/2023.   Hypertension:  Medications: Currently on amlodipine 2.5 mg daily and losartan 50 mg daily.  BP readings at home: She has been checking her BP regularly at home and says her systolic readings at home have been 120s-130s/70.  Negative for unusual or severe headache, visual changes, exertional chest pain, dyspnea, palpitations, focal weakness, or edema.  Lab Results  Component Value Date   CREATININE 0.78 10/18/2023   BUN 13 10/18/2023   NA 139 10/18/2023   K 4.1 10/18/2023   CL 103 10/18/2023   CO2 31 10/18/2023   Hyperlipidemia: Currently on pravastatin 20 mg daily.  Side effects from medication: none Lab Results  Component Value Date   CHOL 128 08/01/2023   HDL 43.50 08/01/2023   LDLCALC 41 08/01/2023   TRIG 216.0 (H) 08/01/2023   CHOLHDL 3 08/01/2023   Iron deficiency anemia: She has since seen her oncologist in 10/2023. She had blood work done. She is to follow prn. She is on iron supplementation.     Latest Ref Rng & Units 10/18/2023    3:39 PM 08/01/2023    2:36 PM 12/07/2021    4:12 PM  CBC  WBC 4.0 - 10.5 K/uL 5.9  5.6  6.1   Hemoglobin 12.0 - 15.0 g/dL 04.5  40.9  81.1   Hematocrit 36.0 - 46.0 % 33.0  31.5  33.1   Platelets 150 - 400 K/uL 275  289.0  317.0     Chronic pain:Generalized OA and back pain. Currently on tramadol 50 mg twice daily as needed for back pain and Meloxicam 15 mg daily.  She has also been having epidural injections every 3-4 months, which help some.  She describes the back pain as a burning sensation followed by a stabbing sensation mainly on the right.  She says her hips and knees pain have not been as bad lately.  Uses a cane for transfer.  She is still working four hours per day at [employer].   Depression:  Currently on citalopram 10 mg daily.  She believes she still needs the medication and that it is helping.   Osteoporosis:  Currently on Fosamax 70 mg weekly, started in 05/2019. Her last DEXA scan was ordered in 07/2023 but somehow she has not heard about appt.  GERD: She is on Omeprazole 40 mg daily, which has helped with heartburn. Negative for abdominal pain, nausea, vomiting, changes in bowel habits, blood in stool or melena.  Concerns today:   At her home health visit , provider mentioned she heard a heart murmur and right sided carotid bruit. Also LLE mildly abnormal screening. No prior Hx of PAD.  Review of Systems  Constitutional:  Negative for activity change, appetite change, chills and fever.  HENT:  Negative for nosebleeds and sore throat.   Respiratory:  Negative for cough and wheezing.   Endocrine: Negative for cold intolerance and heat intolerance.  Genitourinary:  Negative for decreased urine volume, dysuria and hematuria.  Musculoskeletal:  Positive for arthralgias, back pain and gait problem.  Skin:  Negative for rash.  Neurological:  Negative for syncope and facial asymmetry.  Psychiatric/Behavioral:  Negative for confusion and hallucinations.   See other pertinent positives and negatives in HPI.  Current Outpatient Medications on File  Prior to Visit  Medication Sig Dispense Refill   alendronate (FOSAMAX) 70 MG tablet TAKE 1 TABLET BY MOUTH WEEKLY  WITH 8 OZ OF PLAIN WATER 30  MINUTES BEFORE FIRST FOOD, DRINK OR MEDS. STAY UPRIGHT FOR 30  MINS 12 tablet 3   amLODipine (NORVASC) 2.5 MG tablet TAKE 1 TABLET(2.5 MG) BY MOUTH AT BEDTIME 90 tablet 1   Apoaequorin (PREVAGEN EXTRA STRENGTH) 20 MG CAPS Take 1 tablet by mouth daily.     b complex vitamins capsule Take 1 capsule by mouth daily.     Calcium-Phosphorus-Vitamin D 250-100-500 MG-MG-UNIT CHEW Chew by mouth.     cetirizine (ZYRTEC) 10 MG tablet Take 10 mg by mouth at bedtime.      chlorhexidine (PERIDEX) 0.12 % solution Use as directed 15 mLs in the mouth or throat 2 (two) times daily.     Cholecalciferol (VITAMIN D3) 50 MCG (2000 UT) TABS Take by mouth daily.     citalopram (CELEXA) 10 MG tablet TAKE 1 TABLET(10 MG) BY MOUTH DAILY 90 tablet 1   clobetasol cream (TEMOVATE) 0.05 % Bid on affected area for 14 days and then daily. 45 g 0   famotidine (PEPCID) 20 MG tablet Take 1 tablet (20 mg total) by mouth daily. Before dinner 30 tablet 11   ferrous sulfate 325 (65 FE) MG tablet Take 325 mg by mouth daily with breakfast.     fluticasone (FLONASE) 50 MCG/ACT nasal spray SHAKE LIQUID AND USE 1 SPRAY IN EACH NOSTRIL TWICE DAILY AS NEEDED FOR ALLERGIES OR RHINITIS 16 g 11   glucosamine-chondroitin 500-400 MG tablet Take 1 tablet by mouth 2 (two) times daily.     losartan (COZAAR) 50 MG tablet TAKE 1 TABLET BY MOUTH DAILY 100 tablet 2   meclizine (ANTIVERT) 25 MG tablet TAKE 1/2 TO 1 TABLET BY  MOUTH TWICE DAILY AS NEEDED FOR DIZZINESS 90 tablet 1   meloxicam (MOBIC) 15 MG tablet TAKE 1 TABLET BY MOUTH DAILY 100 tablet 2   montelukast (SINGULAIR) 10 MG tablet TAKE 1 TABLET BY MOUTH AT  BEDTIME 100 tablet 2   Multiple Vitamin (MULTIVITAMIN WITH MINERALS) TABS tablet Take 1 tablet by mouth daily.     polyethylene glycol (MIRALAX / GLYCOLAX) 17 g packet Take 17 g by mouth daily.     pravastatin (PRAVACHOL) 20 MG tablet TAKE 1 TABLET BY MOUTH DAILY 100 tablet 2   traMADol (ULTRAM) 50 MG tablet Take 1 tablet (50 mg total) by mouth every 12 (twelve) hours as needed. 60 tablet 3   vitamin C (VITAMIN C) 1000 MG tablet Take 1 tablet (1,000 mg total) by mouth daily. 30 tablet 0   No current facility-administered medications on file prior to visit.   Past Medical History:  Diagnosis Date   Allergy    Anemia    Arthritis    GERD (gastroesophageal reflux disease)    Hyperlipidemia    Allergies  Allergen Reactions   Cymbalta [Duloxetine Hcl] Nausea Only    Adverse reaction caused  dizziness and nausea   Elemental Sulfur Swelling and Other (See Comments)    Reaction:  All over body swelling    Penicillins Rash and Other (See Comments)    Has patient had a PCN reaction causing immediate rash, facial/tongue/throat swelling, SOB or lightheadedness with hypotension: No Has patient had a PCN reaction causing severe rash involving mucus membranes or skin necrosis: No Has patient had a PCN reaction that required hospitalization No Has patient had a PCN reaction occurring  within the last 10 years: No If all of the above answers are "NO", then may proceed with Cephalosporin use.   Social History   Socioeconomic History   Marital status: Widowed    Spouse name: Not on file   Number of children: 3   Years of education: Not on file   Highest education level: Associate degree: occupational, Scientist, product/process development, or vocational program  Occupational History   Occupation: Lobbyist: [employer]    Comment: works 25-30hrs/week  Tobacco Use   Smoking status: Former    Types: Cigarettes   Smokeless tobacco: Never   Tobacco comments:    Smoked from [place] to 1985 max 1.25 PPD  Vaping Use   Vaping status: Never Used  Substance and Sexual Activity   Alcohol use: No   Drug use: No   Sexual activity: Not Currently    Comment: 1st intercourse 78 yo-Fewer than 5 partners  Other Topics Concern   Not on file  Social History Narrative   01/22/2019:   Widowed since 10/2017; lives alone now in [place] house with 2 dogs   Has one son, and one stepson and Museum/gallery conservator. Sons live close by, supportive.   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, nature, likes to stay busy   [employer] community support system   Social Drivers of Health   Financial Resource Strain: Low Risk  (01/29/2024)   Overall Financial Resource Strain (CARDIA)    Difficulty of Paying Living Expenses: Not very hard  Food Insecurity: No Food Insecurity  (01/29/2024)   Hunger Vital Sign    Worried About Running Out of Food in the Last Year: Never true    Ran Out of Food in the Last Year: Never true  Transportation Needs: No Transportation Needs (01/29/2024)   PRAPARE - Administrator, Civil Service (Medical): No    Lack of Transportation (Non-Medical): No  Physical Activity: Insufficiently Active (01/29/2024)   Exercise Vital Sign    Days of Exercise per Week: 2 days    Minutes of Exercise per Session: 20 min  Stress: No Stress Concern Present (01/29/2024)   [employer] of Occupational Health - Occupational Stress Questionnaire    Feeling of Stress : Not at all  Social Connections: Moderately Integrated (01/29/2024)   Social Connection and Isolation Panel [NHANES]    Frequency of Communication with Friends and Family: More than three times a week    Frequency of Social Gatherings with Friends and Family: Twice a week    Attends Religious Services: More than 4 times per year    Active Member of [employer] or Organizations: Yes    Attends Banker Meetings: More than 4 times per year    Marital Status: Widowed   Vitals:   01/30/24 1357  BP: 128/80  Pulse: 85  Resp: 16  SpO2: 96%   Body mass index is 27.38 kg/m.  Physical Exam Vitals and nursing note reviewed.  Constitutional:      General: She is not in acute distress.    Appearance: She is well-developed.  HENT:     Head: Normocephalic and atraumatic.     Mouth/Throat:     Mouth: Mucous membranes are moist.     Pharynx: Oropharynx is clear. Uvula midline.  Eyes:     Conjunctiva/sclera: Conjunctivae normal.  Neck:     Vascular: Carotid bruit (Right-sided.) present.  Cardiovascular:     Rate  and Rhythm: Normal rate and regular rhythm.     Pulses:          Posterior tibial pulses are 2+ on the right side and 2+ on the left side.     Heart sounds: Murmur (? Soft SEM RUSB) heard.  Pulmonary:     Effort: Pulmonary effort is normal. No respiratory  distress.     Breath sounds: Normal breath sounds.  Abdominal:     Palpations: Abdomen is soft. There is no hepatomegaly or mass.     Tenderness: There is no abdominal tenderness.  Musculoskeletal:     Right lower leg: No edema.     Left lower leg: No edema.  Skin:    General: Skin is warm.     Findings: No erythema or rash.  Neurological:     Mental Status: She is alert and oriented to person, place, and time.     Comments: Antalgic gait, not assisted.  Psychiatric:        Mood and Affect: Mood and affect normal.   ASSESSMENT AND PLAN:  Ms. Allaire was seen today for chronic disease management.   Orders Placed This Encounter  Procedures   EKG 12-Lead   VAS US CAROTID   VAS [place] ABI WITH/WO TBI   Bruit of right carotid artery We discussed Dx and possible complications. Carotid US will be arranged. LDL 41 in 07/2023. Currently on Pravastatin.  -     VAS US CAROTID; Future  Localized osteoporosis without current pathological fracture Assessment & Plan: She has been on Fosamax 70 mg weekly since 2020. Continue current treatment until DEXA is done, ideally with her next mammogram in 06/2024. Fall precautions and adequate calcium and vit D supplementation to continue.  Chronic low back pain, unspecified back pain laterality, unspecified whether sciatica present Assessment & Plan: With radiculopathy. Epidural injections q 3-4 months has helped some. Following with ortho regularly.  Essential hypertension, benign Assessment & Plan: BP is adequately controlled. Continue Amlodipine 2.5 mg daily and Losartan 50 mg daily as well as low salt diet. EKG today NSR, normal axis,LAE, ?IVCD. No significant changes when compared with EKG in 01/2017.  Orders: -     EKG 12-Lead  Chronic pain disorder Assessment & Plan: Pain is adequately controlled with current regimen, she is able to function. Continue tramadol 50 mg twice daily as needed. PMP reviewed. Med contract is  current. Follow-up in 6 months.  PAD (peripheral artery disease) (HCC) Screening done during home health visit, RLE "quantaflo" 0.98 and LLE 0.84 (mildly abnormal). No claudication like symptoms. ABI will be arranged.  -     VAS [place] ABI WITH/WO TBI; Future  Iron deficiency anemia due to chronic blood loss Assessment & Plan: Otherwise stable. Continue Fe sulfate 325 mg daily. Colonoscopy 04/16/16. GI evaluation 10/2023.  Gastroesophageal reflux disease without esophagitis Assessment & Plan: Problem has improved. Continue Omeprazole 40 mg daily and GERD precautions.  Orders: -     Omeprazole; Take 1 capsule (40 mg total) by mouth daily.  Dispense: 90 capsule; Refill: 3  Mild recurrent major depression (HCC) Assessment & Plan: Stable. Continue Celexa 20 mg daily.  -In regard to heart murmur ,? Soft SEM RUSB. She is not having symptoms that suggest serious valvular disease. We discussed options, including monitoring for now or having echo arranged, she agrees with former one. Clearly instructed about warning signs.  I spent a total of 42 minutes in both face to face and non face to face activities  for this visit on the date of this encounter, excluding the time she had EKG done. During this time history was obtained and documented, examination was performed, prior labs/imaging reviewed, and assessment/plan discussed.  Return in about 5 months (around 07/01/2024) for chronic problems.  I, Rolla Etienne Wierda, acting as a scribe for Tabitha Sandate [place], MD., have documented all relevant documentation on the behalf of Tabitha Decaprio [place], MD, as directed by  Tabitha Pangborn [place], MD while in the presence of Tabitha Cory [place], MD.   I, Tabitha Miner [place], MD, have reviewed all documentation for this visit. The documentation on 01/30/24 for the exam, diagnosis, procedures, and orders are all accurate and complete.  Tabitha Rogus G. [place], MD  [facility] [place] office.

## 2024-01-30 NOTE — Assessment & Plan Note (Signed)
 Pain is adequately controlled with current regimen, she is able to function. Continue tramadol 50 mg twice daily as needed. PMP reviewed. Med contract is current. Follow-up in 6 months.

## 2024-01-30 NOTE — Assessment & Plan Note (Signed)
Stable.  Continue Celexa 20 mg daily. 

## 2024-01-30 NOTE — Assessment & Plan Note (Signed)
 Problem has improved. Continue Omeprazole 40 mg daily and GERD precautions.

## 2024-01-30 NOTE — Assessment & Plan Note (Addendum)
 BP is adequately controlled. Continue Amlodipine 2.5 mg daily and Losartan 50 mg daily as well as low salt diet. EKG today NSR, normal axis,LAE, ?IVCD. No significant changes when compared with EKG in 01/2017.

## 2024-01-30 NOTE — Patient Instructions (Addendum)
 A few things to remember from today's visit:  Essential hypertension, benign - Plan: EKG 12-Lead  Chronic pain disorder  Bruit of right carotid artery - Plan: VAS US CAROTID  PAD (peripheral artery disease) (HCC) - Plan: VAS [place] ABI WITH/WO TBI  Localized osteoporosis without current pathological fracture  No changes today. Appt for ultrasound of neck and legs will be arranged.  If you need refills for medications you take chronically, please call your pharmacy. Do not use My Chart to request refills or for acute issues that need immediate attention. If you send a my chart message, it may take a few days to be addressed, specially if I am not in the office.  Please be sure medication list is accurate. If a new problem present, please set up appointment sooner than planned today.

## 2024-01-30 NOTE — Assessment & Plan Note (Addendum)
 With radiculopathy. Epidural injections q 3-4 months has helped some. Following with ortho regularly.

## 2024-02-02 ENCOUNTER — Other Ambulatory Visit: Payer: Self-pay | Admitting: Family Medicine

## 2024-02-02 DIAGNOSIS — M9903 Segmental and somatic dysfunction of lumbar region: Secondary | ICD-10-CM | POA: Diagnosis not present

## 2024-02-02 DIAGNOSIS — I10 Essential (primary) hypertension: Secondary | ICD-10-CM

## 2024-02-02 DIAGNOSIS — M5136 Other intervertebral disc degeneration, lumbar region with discogenic back pain only: Secondary | ICD-10-CM | POA: Diagnosis not present

## 2024-02-14 DIAGNOSIS — M9903 Segmental and somatic dysfunction of lumbar region: Secondary | ICD-10-CM | POA: Diagnosis not present

## 2024-02-14 DIAGNOSIS — M5136 Other intervertebral disc degeneration, lumbar region with discogenic back pain only: Secondary | ICD-10-CM | POA: Diagnosis not present

## 2024-02-17 ENCOUNTER — Ambulatory Visit (HOSPITAL_BASED_OUTPATIENT_CLINIC_OR_DEPARTMENT_OTHER)
Admission: RE | Admit: 2024-02-17 | Discharge: 2024-02-17 | Disposition: A | Discharge status: Home / Self Care | Source: Ambulatory Visit | Attending: Family Medicine | Admitting: Family Medicine

## 2024-02-17 ENCOUNTER — Ambulatory Visit (HOSPITAL_COMMUNITY)
Admission: RE | Admit: 2024-02-17 | Discharge: 2024-02-17 | Disposition: A | Discharge status: Home / Self Care | Source: Ambulatory Visit | Attending: Family Medicine | Admitting: Family Medicine

## 2024-02-17 DIAGNOSIS — I739 Peripheral vascular disease, unspecified: Secondary | ICD-10-CM | POA: Insufficient documentation

## 2024-02-17 DIAGNOSIS — R0989 Other specified symptoms and signs involving the circulatory and respiratory systems: Secondary | ICD-10-CM | POA: Insufficient documentation

## 2024-02-17 LAB — VAS US ABI WITH/WO TBI
Left ABI: 1.24
Right ABI: 1.22

## 2024-02-21 ENCOUNTER — Encounter: Payer: Self-pay | Admitting: Family Medicine

## 2024-02-28 DIAGNOSIS — M5136 Other intervertebral disc degeneration, lumbar region with discogenic back pain only: Secondary | ICD-10-CM | POA: Diagnosis not present

## 2024-02-28 DIAGNOSIS — M9903 Segmental and somatic dysfunction of lumbar region: Secondary | ICD-10-CM | POA: Diagnosis not present

## 2024-03-13 DIAGNOSIS — M5136 Other intervertebral disc degeneration, lumbar region with discogenic back pain only: Secondary | ICD-10-CM | POA: Diagnosis not present

## 2024-03-13 DIAGNOSIS — M9903 Segmental and somatic dysfunction of lumbar region: Secondary | ICD-10-CM | POA: Diagnosis not present

## 2024-03-14 DIAGNOSIS — M5416 Radiculopathy, lumbar region: Secondary | ICD-10-CM | POA: Diagnosis not present

## 2024-03-27 DIAGNOSIS — M5136 Other intervertebral disc degeneration, lumbar region with discogenic back pain only: Secondary | ICD-10-CM | POA: Diagnosis not present

## 2024-03-27 DIAGNOSIS — M9903 Segmental and somatic dysfunction of lumbar region: Secondary | ICD-10-CM | POA: Diagnosis not present

## 2024-03-28 ENCOUNTER — Other Ambulatory Visit: Payer: Self-pay | Admitting: Rehabilitation

## 2024-03-28 DIAGNOSIS — M5416 Radiculopathy, lumbar region: Secondary | ICD-10-CM

## 2024-04-05 ENCOUNTER — Ambulatory Visit
Admission: RE | Admit: 2024-04-05 | Discharge: 2024-04-05 | Disposition: A | Discharge status: Home / Self Care | Source: Ambulatory Visit | Attending: Rehabilitation | Admitting: Rehabilitation

## 2024-04-05 DIAGNOSIS — M5416 Radiculopathy, lumbar region: Secondary | ICD-10-CM

## 2024-04-05 DIAGNOSIS — M48061 Spinal stenosis, lumbar region without neurogenic claudication: Secondary | ICD-10-CM | POA: Diagnosis not present

## 2024-04-05 DIAGNOSIS — M419 Scoliosis, unspecified: Secondary | ICD-10-CM | POA: Diagnosis not present

## 2024-04-11 DIAGNOSIS — M9903 Segmental and somatic dysfunction of lumbar region: Secondary | ICD-10-CM | POA: Diagnosis not present

## 2024-04-11 DIAGNOSIS — M5136 Other intervertebral disc degeneration, lumbar region with discogenic back pain only: Secondary | ICD-10-CM | POA: Diagnosis not present

## 2024-04-12 DIAGNOSIS — M5116 Intervertebral disc disorders with radiculopathy, lumbar region: Secondary | ICD-10-CM | POA: Diagnosis not present

## 2024-04-18 DIAGNOSIS — Z961 Presence of intraocular lens: Secondary | ICD-10-CM | POA: Diagnosis not present

## 2024-04-18 DIAGNOSIS — H43393 Other vitreous opacities, bilateral: Secondary | ICD-10-CM | POA: Diagnosis not present

## 2024-04-29 ENCOUNTER — Other Ambulatory Visit: Payer: Self-pay | Admitting: Family Medicine

## 2024-04-29 DIAGNOSIS — G894 Chronic pain syndrome: Secondary | ICD-10-CM

## 2024-04-29 DIAGNOSIS — M159 Polyosteoarthritis, unspecified: Secondary | ICD-10-CM

## 2024-04-29 DIAGNOSIS — J309 Allergic rhinitis, unspecified: Secondary | ICD-10-CM

## 2024-04-30 NOTE — Telephone Encounter (Signed)
 Last OV: 01/30/24 Next OV: 07/03/24 Last Filled: 04/01/24

## 2024-05-01 DIAGNOSIS — M9903 Segmental and somatic dysfunction of lumbar region: Secondary | ICD-10-CM | POA: Diagnosis not present

## 2024-05-01 DIAGNOSIS — M5136 Other intervertebral disc degeneration, lumbar region with discogenic back pain only: Secondary | ICD-10-CM | POA: Diagnosis not present

## 2024-05-07 DIAGNOSIS — L814 Other melanin hyperpigmentation: Secondary | ICD-10-CM | POA: Diagnosis not present

## 2024-05-07 DIAGNOSIS — D1801 Hemangioma of skin and subcutaneous tissue: Secondary | ICD-10-CM | POA: Diagnosis not present

## 2024-05-07 DIAGNOSIS — L638 Other alopecia areata: Secondary | ICD-10-CM | POA: Diagnosis not present

## 2024-05-07 DIAGNOSIS — L2989 Other pruritus: Secondary | ICD-10-CM | POA: Diagnosis not present

## 2024-05-07 DIAGNOSIS — L720 Epidermal cyst: Secondary | ICD-10-CM | POA: Diagnosis not present

## 2024-05-14 DIAGNOSIS — M5416 Radiculopathy, lumbar region: Secondary | ICD-10-CM | POA: Diagnosis not present

## 2024-05-22 DIAGNOSIS — M5136 Other intervertebral disc degeneration, lumbar region with discogenic back pain only: Secondary | ICD-10-CM | POA: Diagnosis not present

## 2024-05-22 DIAGNOSIS — M9903 Segmental and somatic dysfunction of lumbar region: Secondary | ICD-10-CM | POA: Diagnosis not present

## 2024-05-24 ENCOUNTER — Other Ambulatory Visit: Payer: Self-pay | Admitting: Family Medicine

## 2024-05-24 DIAGNOSIS — Z1231 Encounter for screening mammogram for malignant neoplasm of breast: Secondary | ICD-10-CM

## 2024-06-05 DIAGNOSIS — M5136 Other intervertebral disc degeneration, lumbar region with discogenic back pain only: Secondary | ICD-10-CM | POA: Diagnosis not present

## 2024-06-05 DIAGNOSIS — M9903 Segmental and somatic dysfunction of lumbar region: Secondary | ICD-10-CM | POA: Diagnosis not present

## 2024-06-11 DIAGNOSIS — M5416 Radiculopathy, lumbar region: Secondary | ICD-10-CM | POA: Diagnosis not present

## 2024-06-18 ENCOUNTER — Ambulatory Visit
Admission: RE | Admit: 2024-06-18 | Discharge: 2024-06-18 | Disposition: A | Discharge status: Home / Self Care | Source: Ambulatory Visit | Attending: Family Medicine | Admitting: Family Medicine

## 2024-06-18 DIAGNOSIS — Z1231 Encounter for screening mammogram for malignant neoplasm of breast: Secondary | ICD-10-CM | POA: Diagnosis not present

## 2024-06-19 DIAGNOSIS — M9903 Segmental and somatic dysfunction of lumbar region: Secondary | ICD-10-CM | POA: Diagnosis not present

## 2024-06-19 DIAGNOSIS — M5136 Other intervertebral disc degeneration, lumbar region with discogenic back pain only: Secondary | ICD-10-CM | POA: Diagnosis not present

## 2024-07-03 ENCOUNTER — Ambulatory Visit: Admitting: Family Medicine

## 2024-07-03 ENCOUNTER — Ambulatory Visit (INDEPENDENT_AMBULATORY_CARE_PROVIDER_SITE_OTHER)
Admission: RE | Admit: 2024-07-03 | Discharge: 2024-07-03 | Disposition: A | Discharge status: Home / Self Care | Source: Ambulatory Visit | Attending: Family Medicine | Admitting: Family Medicine

## 2024-07-03 ENCOUNTER — Ambulatory Visit: Payer: Self-pay | Admitting: Family Medicine

## 2024-07-03 ENCOUNTER — Encounter: Payer: Self-pay | Admitting: Family Medicine

## 2024-07-03 ENCOUNTER — Ambulatory Visit (INDEPENDENT_AMBULATORY_CARE_PROVIDER_SITE_OTHER): Admitting: Family Medicine

## 2024-07-03 VITALS — BP 140/70 | HR 97 | Resp 16 | Ht <= 58 in | Wt 130.4 lb

## 2024-07-03 DIAGNOSIS — G894 Chronic pain syndrome: Secondary | ICD-10-CM | POA: Diagnosis not present

## 2024-07-03 DIAGNOSIS — K219 Gastro-esophageal reflux disease without esophagitis: Secondary | ICD-10-CM

## 2024-07-03 DIAGNOSIS — E785 Hyperlipidemia, unspecified: Secondary | ICD-10-CM | POA: Diagnosis not present

## 2024-07-03 DIAGNOSIS — D500 Iron deficiency anemia secondary to blood loss (chronic): Secondary | ICD-10-CM

## 2024-07-03 DIAGNOSIS — M546 Pain in thoracic spine: Secondary | ICD-10-CM

## 2024-07-03 DIAGNOSIS — W19XXXA Unspecified fall, initial encounter: Secondary | ICD-10-CM | POA: Diagnosis not present

## 2024-07-03 DIAGNOSIS — Z043 Encounter for examination and observation following other accident: Secondary | ICD-10-CM | POA: Diagnosis not present

## 2024-07-03 DIAGNOSIS — M816 Localized osteoporosis [Lequesne]: Secondary | ICD-10-CM

## 2024-07-03 DIAGNOSIS — R7303 Prediabetes: Secondary | ICD-10-CM

## 2024-07-03 DIAGNOSIS — M5134 Other intervertebral disc degeneration, thoracic region: Secondary | ICD-10-CM | POA: Diagnosis not present

## 2024-07-03 DIAGNOSIS — M4804 Spinal stenosis, thoracic region: Secondary | ICD-10-CM | POA: Diagnosis not present

## 2024-07-03 DIAGNOSIS — M4185 Other forms of scoliosis, thoracolumbar region: Secondary | ICD-10-CM | POA: Diagnosis not present

## 2024-07-03 DIAGNOSIS — F330 Major depressive disorder, recurrent, mild: Secondary | ICD-10-CM

## 2024-07-03 DIAGNOSIS — R0781 Pleurodynia: Secondary | ICD-10-CM

## 2024-07-03 DIAGNOSIS — I10 Essential (primary) hypertension: Secondary | ICD-10-CM | POA: Diagnosis not present

## 2024-07-03 DIAGNOSIS — M545 Low back pain, unspecified: Secondary | ICD-10-CM

## 2024-07-03 LAB — CBC
HCT: 31.1 % — ABNORMAL LOW (ref 36.0–46.0)
Hemoglobin: 10.7 g/dL — ABNORMAL LOW (ref 12.0–15.0)
MCHC: 34.3 g/dL (ref 30.0–36.0)
MCV: 86.4 fl (ref 78.0–100.0)
Platelets: 307 K/uL (ref 150.0–400.0)
RBC: 3.6 Mil/uL — ABNORMAL LOW (ref 3.87–5.11)
RDW: 13.7 % (ref 11.5–15.5)
WBC: 5.9 K/uL (ref 4.0–10.5)

## 2024-07-03 LAB — BASIC METABOLIC PANEL WITH GFR
BUN: 18 mg/dL (ref 6–23)
CO2: 28 meq/L (ref 19–32)
Calcium: 9.4 mg/dL (ref 8.4–10.5)
Chloride: 99 meq/L (ref 96–112)
Creatinine, Ser: 0.77 mg/dL (ref 0.40–1.20)
GFR: 73.91 mL/min (ref >60.00)
Glucose, Bld: 96 mg/dL (ref 70–99)
Potassium: 4.7 meq/L (ref 3.5–5.1)
Sodium: 134 meq/L — ABNORMAL LOW (ref 135–145)

## 2024-07-03 MED ORDER — AMLODIPINE BESYLATE 2.5 MG PO TABS: 2.5000 mg | ORAL_TABLET | Freq: Every day | ORAL | 2 refills | Status: AC

## 2024-07-03 MED ORDER — CITALOPRAM HYDROBROMIDE 10 MG PO TABS
10.0000 mg | ORAL_TABLET | Freq: Every day | ORAL | 2 refills | Status: DC
Start: 2024-07-03 — End: 2024-07-06

## 2024-07-03 NOTE — Assessment & Plan Note (Addendum)
 BP mildly elevated today, reporting SBP's at home 130. For now no changes in current management, continue amlodipine  2.5 mg daily and losartan  50 mg daily as well as low-salt diet. Continue monitoring BP at home regularly. Follow-up in 6 months.

## 2024-07-03 NOTE — Assessment & Plan Note (Signed)
 Pain is adequately controlled with current regimen, she is able to function. Continue tramadol  50 mg twice daily as needed. PMP reviewed. Med contract signed today. Follow-up in 6 months.

## 2024-07-03 NOTE — Assessment & Plan Note (Signed)
 Problem is well-controlled. Continue omeprazole  40 mg daily before breakfast as well as GERD precautions.

## 2024-07-03 NOTE — Assessment & Plan Note (Signed)
 New DEXA order placed. Continue adequate calcium and vitamin D supplementation as well as fall prevention. Regular physical activity as tolerated, which is difficult due to unstable gait/generalized OA.

## 2024-07-03 NOTE — Assessment & Plan Note (Signed)
 Currently she is not on iron supplementation. She is no longer following with hematologist. Further recommendation will be given according to CBC results.

## 2024-07-03 NOTE — Progress Notes (Signed)
 HPI: [name] is a 78 y.o. female with a PMHx significant for HTN, HLD, GERD, scoliosis, chronic back pain, iron deficiency anemia, and prediabetes, who is here today for chronic disease management.  Last seen on 01/30/24 She has since followed up with cardiology and orthopedics.  Hypertension: Medications: Amlodipine  2.5 mg once daily, Losartan  50 mg once daily Good compliance and tolerance to current antihypertensive regimen.  BP readings at home: Reports systolic readings in the 130s.  Negative for unusual or severe headache, visual changes, exertional chest pain, dyspnea,  focal weakness, or edema. Lab Results  Component Value Date   CREATININE 0.78 10/18/2023   BUN 13 10/18/2023   NA 139 10/18/2023   K 4.1 10/18/2023   CL 103 10/18/2023   CO2 31 10/18/2023   Chronic pain: Generalized OA and back pain OA affecting IP of hands and feet, knees, thoracic and lower back. Pain can be moderate to severe, depending of level of activity.  Currently on Tramadol  50 mg BID prn for back pain and Meloxicam  15 mg daily.  Follows up with orthopedist for lumbar radiculopathy..  Some relief with recent epidural injections, which she typically gets every 3-4 months.  Reports Tramadol  has been helpful with pain relief.   Hyperlipidemia: Currently on Pravastatin  20 mg once daily. Side effects from medication: None.  Lab Results  Component Value Date   CHOL 128 08/01/2023   HDL 43.50 08/01/2023   LDLCALC 41 08/01/2023   TRIG 216.0 (H) 08/01/2023   CHOLHDL 3 08/01/2023   Anxiety/Depression: Moods are stable.  Currently on Citalopram  10 mg once daily. Good compliance and tolerance reported.  No acute concerns reported.     07/03/2024    8:40 AM 08/01/2023    2:05 PM 07/27/2023    3:08 PM 09/27/2022    3:03 PM 08/13/2022    3:32 PM  Depression screen PHQ 2/9  Decreased Interest 2 0 0 0 0  Down, Depressed, Hopeless 2 0 0 0 0  PHQ - 2 Score 4 0 0 0 0  Altered  sleeping 2 0   0  Tired, decreased energy 2 0   0  Change in appetite 1 0   0  Feeling bad or failure about yourself  0 0   0  Trouble concentrating 2 0   0  Moving slowly or fidgety/restless 0 0   0  Suicidal thoughts 0 0   0  PHQ-9 Score 11 0   0  Difficult doing work/chores Not difficult at all Not difficult at all   Not difficult at all      07/03/2024    8:40 AM 08/28/2021   10:01 PM 02/04/2020    5:55 PM  GAD 7 : Generalized Anxiety Score  Nervous, Anxious, on Edge 2 1 0  Control/stop worrying 1 1 0  Worry too much - different things 1 0 1  Trouble relaxing 0 0 0  Restless 1 0 0  Easily annoyed or irritable 0 0 1  Afraid - awful might happen 0 0 0  Total GAD 7 Score 5 2 2   Anxiety Difficulty Not difficult at all Not difficult at all Not difficult at all   Osteoporosis: Currently on Fosamax  70 mg once weekly; started in 05/2019.  Has not had DEXA scan yet, states she had issues scheduling it. Last DEXA scan was ordered in 07/2023.   GERD: Currently on Omeprazole  40 mg once daily with good compliance and tolerance.  She also takes  Pepcid  prn, as advised by GI.  She is established with GI; last seen on 10/25/23.   Iron deficiency anemia: Not currently taking any iron supplementations.  She is established with a hematologist/oncologist; last seen 10/2023. No further follow up was recommended, per pt report. Negative for black/tarry stools, blood in stools, or changes in bowel habits.   Lab Results  Component Value Date   IRON 56 10/18/2023   TIBC 297 10/18/2023   FERRITIN 118.8 09/19/2023   Acute Concerns addressed today: Fall She reports a recent fall while at work 5 days ago on 8/14. She recalls losing her balance and falling backwards while she was at work, hitting her right back/side on a shopping cart before hitting the floor.  She endorses right rib and mid-back pain.  Her pain while sitting is a 4/10. Pain tends to worsen when coughing or breathing deep.   She did not report the incident to her supervisor/manager, but states she did mention it to some of her coworkers.  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  Review of Systems  Constitutional:  Negative for activity change, appetite change, chills and fever.  HENT:  Negative for mouth sores and sore throat.   Respiratory:  Negative for cough and wheezing.   Gastrointestinal:  Negative for abdominal pain, nausea and vomiting.  Genitourinary:  Negative for decreased urine volume, dysuria and hematuria.  Musculoskeletal:  Positive for arthralgias, back pain and gait problem.  Skin:  Negative for rash.  Neurological:  Negative for syncope and facial asymmetry.  Psychiatric/Behavioral:  Negative for confusion and hallucinations.   See other pertinent positives and negatives in HPI.  Current Outpatient Medications on File Prior to Visit  Medication Sig Dispense Refill   alendronate  (FOSAMAX ) 70 MG tablet TAKE 1 TABLET BY MOUTH WEEKLY  WITH 8 OZ OF PLAIN WATER 30  MINUTES BEFORE FIRST FOOD, DRINK OR MEDS. STAY UPRIGHT FOR 30  MINS 12 tablet 3   amLODipine  (NORVASC ) 2.5 MG tablet TAKE 1 TABLET(2.5 MG) BY MOUTH AT BEDTIME 90 tablet 2   Apoaequorin (PREVAGEN EXTRA STRENGTH) 20 MG CAPS Take 1 tablet by mouth daily.     b complex vitamins capsule Take 1 capsule by mouth daily.     Calcium-Phosphorus-Vitamin D 250-100-500 MG-MG-UNIT CHEW Chew by mouth.     cetirizine (ZYRTEC) 10 MG tablet Take 10 mg by mouth at bedtime.     chlorhexidine (PERIDEX) 0.12 % solution Use as directed 15 mLs in the mouth or throat 2 (two) times daily.     Cholecalciferol (VITAMIN D3) 50 MCG (2000 UT) TABS Take by mouth daily.     citalopram  (CELEXA ) 10 MG tablet TAKE 1 TABLET(10 MG) BY MOUTH DAILY 90 tablet 1   clobetasol  cream (TEMOVATE ) 0.05 % Bid on affected area for 14 days and then daily. 45 g 0   famotidine  (PEPCID ) 20 MG tablet Take 1 tablet (20 mg total) by mouth  daily. Before dinner 30 tablet 11   fluticasone  (FLONASE ) 50 MCG/ACT nasal spray SHAKE LIQUID AND USE 1 SPRAY IN EACH NOSTRIL TWICE DAILY AS NEEDED FOR ALLERGIES OR RHINITIS 48 g 3   glucosamine-chondroitin 500-400 MG tablet Take 1 tablet by mouth 2 (two) times daily.     losartan  (COZAAR ) 50 MG tablet TAKE 1 TABLET BY MOUTH DAILY 100 tablet 2   meclizine  (ANTIVERT ) 25 MG tablet TAKE 1/2 TO 1 TABLET BY  MOUTH TWICE DAILY AS NEEDED FOR  DIZZINESS 90 tablet 1   meloxicam  (MOBIC ) 15 MG tablet TAKE 1 TABLET BY MOUTH DAILY 100 tablet 2   montelukast  (SINGULAIR ) 10 MG tablet TAKE 1 TABLET BY MOUTH AT  BEDTIME 100 tablet 2   Multiple Vitamin (MULTIVITAMIN WITH MINERALS) TABS tablet Take 1 tablet by mouth daily.     omeprazole  (PRILOSEC) 40 MG capsule Take 1 capsule (40 mg total) by mouth daily. 90 capsule 3   polyethylene glycol (MIRALAX / GLYCOLAX) 17 g packet Take 17 g by mouth daily.     pravastatin  (PRAVACHOL ) 20 MG tablet TAKE 1 TABLET BY MOUTH DAILY 100 tablet 2   traMADol  (ULTRAM ) 50 MG tablet Take 1 tablet (50 mg total) by mouth every 12 (twelve) hours as needed. 60 tablet 3   vitamin C  (VITAMIN C ) 1000 MG tablet Take 1 tablet (1,000 mg total) by mouth daily. 30 tablet 0   No current facility-administered medications on file prior to visit.    Past Medical History:  Diagnosis Date   Allergy    Anemia    Anxiety 2018   Arthritis    Cataract 2015   had them removed   Depression 2018   GERD (gastroesophageal reflux disease)    Hyperlipidemia    Hypertension 2019   Allergies  Allergen Reactions   Cymbalta  [Duloxetine  Hcl] Nausea Only    Adverse reaction caused dizziness and nausea   Elemental Sulfur Swelling and Other (See Comments)    Reaction:  All over body swelling    Penicillins Rash and Other (See Comments)    Has patient had a PCN reaction causing immediate rash, facial/tongue/throat swelling, SOB or lightheadedness with hypotension: No Has patient had a PCN reaction causing  severe rash involving mucus membranes or skin necrosis: No Has patient had a PCN reaction that required hospitalization No Has patient had a PCN reaction occurring within the last 10 years: No If all of the above answers are NO, then may proceed with Cephalosporin use.    Social History   Socioeconomic History   Marital status: Widowed    Spouse name: Not on file   Number of children: 3   Years of education: Not on file   Highest education level: Associate degree: occupational, Scientist, product/process development, or vocational program  Occupational History   Occupation: Lobbyist: [employer]    Comment: works 25-30hrs/week  Tobacco Use   Smoking status: Former    Types: Cigarettes   Smokeless tobacco: Never   Tobacco comments:    Smoked from [place] to 1985 max 1.25 PPD  Vaping Use   Vaping status: Never Used  Substance and Sexual Activity   Alcohol use: No   Drug use: No   Sexual activity: Not Currently    Comment: 1st intercourse 78 yo-Fewer than 5 partners  Other Topics Concern   Not on file  Social History Narrative   01/22/2019:   Widowed since 10/2017; lives alone now in [place] with 2 dogs   Has one son, and one stepson and Museum/gallery conservator. Sons live close by, supportive.   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, nature, likes to stay busy   [employer] community support system   Social Drivers of Health   Financial Resource Strain: Medium Risk (06/29/2024)   Overall Financial Resource Strain (CARDIA)    Difficulty of Paying Living Expenses: Somewhat hard  Food Insecurity: No Food Insecurity (06/29/2024)   Hunger Vital Sign  Worried About Programme researcher, broadcasting/film/video in the Last Year: Never true    Ran Out of Food in the Last Year: Never true  Transportation Needs: No Transportation Needs (06/29/2024)   PRAPARE - Administrator, Civil Service (Medical): No    Lack of Transportation (Non-Medical): No  Physical  Activity: Inactive (06/29/2024)   Exercise Vital Sign    Days of Exercise per Week: 0 days    Minutes of Exercise per Session: Not on file  Stress: Stress Concern Present (06/29/2024)   [employer] of Occupational Health - Occupational Stress Questionnaire    Feeling of Stress: To some extent  Social Connections: Moderately Integrated (06/29/2024)   Social Connection and Isolation Panel    Frequency of Communication with Friends and Family: More than three times a week    Frequency of Social Gatherings with Friends and Family: Once a week    Attends Religious Services: More than 4 times per year    Active Member of [employer] or Organizations: Yes    Attends Banker Meetings: More than 4 times per year    Marital Status: Widowed    Vitals:   07/03/24 0757 07/03/24 0832  BP: (!) 142/70 (!) 140/70  Pulse: 97   Resp: 16   SpO2: 98%    Body mass index is 27.25 kg/m.  Physical Exam Vitals and nursing note reviewed.  Constitutional:      General: She is not in acute distress.    Appearance: She is well-developed.  HENT:     Head: Normocephalic and atraumatic.  Eyes:     Conjunctiva/sclera: Conjunctivae normal.  Cardiovascular:     Rate and Rhythm: Normal rate and regular rhythm.     Pulses:          Dorsalis pedis pulses are 2+ on the right side and 2+ on the left side.     Heart sounds: No murmur heard. Pulmonary:     Effort: Pulmonary effort is normal. No respiratory distress.     Breath sounds: Normal breath sounds.  Chest:    Abdominal:     Palpations: Abdomen is soft. There is no mass.     Tenderness: There is no abdominal tenderness.  Musculoskeletal:     Thoracic back: Tenderness present.       Back:  Lymphadenopathy:     Cervical: No cervical adenopathy.  Skin:    General: Skin is warm.     Findings: No erythema or rash.  Neurological:     General: No focal deficit present.     Mental Status: She is alert and oriented to person, place, and  time.     Comments: Antalgic gait assisted with a cane.  Psychiatric:        Mood and Affect: Mood and affect normal.   ASSESSMENT AND PLAN: [name] was seen today for chronic disease management.  Orders Placed This Encounter  Procedures   DG Bone Density   DG Ribs Unilateral Right   DG Thoracic Spine W/Swimmers   Basic metabolic panel with GFR   CBC   Lab Results  Component Value Date   WBC 5.9 07/03/2024   HGB 10.7 (L) 07/03/2024   HCT 31.1 (L) 07/03/2024   MCV 86.4 07/03/2024   PLT 307.0 07/03/2024   Lab Results  Component Value Date   NA 134 (L) 07/03/2024   CL 99 07/03/2024   K 4.7 07/03/2024   CO2 28 07/03/2024   BUN 18  07/03/2024   CREATININE 0.77 07/03/2024   GFR 73.91 07/03/2024   CALCIUM 9.4 07/03/2024   ALBUMIN 4.3 10/18/2023   GLUCOSE 96 07/03/2024   Fall, initial encounter Some of ehr chronic health problems and medications increase risk. Fall precautions discussed.   -     DG Ribs Unilateral Right; Future  Chronic pain disorder Assessment & Plan: Pain is adequately controlled with current regimen, she is able to function. Continue tramadol  50 mg twice daily as needed. PMP reviewed. Med contract signed today. Follow-up in 6 months.  Hyperlipidemia, unspecified hyperlipidemia type Assessment & Plan: Continue pravastatin  20 mg daily and low-fat diet. Last LDL 41 in 07/2023, TG 216, non HDL 85. Today she is not fasting, so we will plan on fasting lipid panel at next visit.  Localized osteoporosis without current pathological fracture Assessment & Plan: New DEXA order placed. Continue adequate calcium and vitamin D supplementation as well as fall prevention. Regular physical activity as tolerated, which is difficult due to unstable gait/generalized OA. Currently on Fosamax  70 mg weekly, will plan on discontinue later this year, has been on med since 2020.  -     DG Bone Density; Future  Acute right-sided thoracic back  pain Started after fall.  She is concerned about fractures. No bone tenderness. Further recommendations according to X ray result.  -     DG Thoracic Spine W/Swimmers; Future  Costal margin pain Concerned about rib fracture. Avoid shallow breathing. Rib X ray ordered. Instructed about warning signs.  -     DG Ribs Unilateral Right; Future  Essential hypertension, benign Assessment & Plan: BP mildly elevated today, reporting SBP's at home 130. For now no changes in current management, continue amlodipine  2.5 mg daily and losartan  50 mg daily as well as low-salt diet. Continue monitoring BP at home regularly. Follow-up in 6 months.  Orders: -     Basic metabolic panel with GFR; Future -     CBC; Future -     amLODIPine  Besylate; Take 1 tablet (2.5 mg total) by mouth daily.  Dispense: 90 tablet; Refill: 2  Iron deficiency anemia due to chronic blood loss Assessment & Plan: Currently she is not on iron supplementation. She is no longer following with hematologist. Further recommendation will be given according to CBC results.  Orders: -     CBC; Future  Gastroesophageal reflux disease without esophagitis Assessment & Plan: Problem is well-controlled. Continue omeprazole  40 mg daily before breakfast as well as GERD precautions.  Mild recurrent major depression (HCC) Assessment & Plan: Reports problem as stable, PHQ score went up from 0 to 11. Continue Citalopram  10 mg daily.  Orders: -     Citalopram  Hydrobromide; Take 1 tablet (10 mg total) by mouth daily.  Dispense: 90 tablet; Refill: 2  I spent a total of 40 minutes in both face to face and non face to face activities for this visit on the date of this encounter. During this time history was obtained and documented, examination was performed, prior labs/imaging reviewed, and assessment/plan discussed.  Return in about 6 months (around 01/03/2025) for chronic problems.  I, Vernell Forest, acting as a scribe for Lyndell Gillyard  [place], MD., have documented all relevant documentation on the behalf of Tabitha Speiser [place], MD, as directed by   while in the presence of Tabitha Buhman [place], MD.  I, Rielly Brunn [place], MD, have reviewed all documentation for this visit. The documentation on 07/03/24 for the exam, diagnosis, procedures, and orders are all accurate and complete.  Daja Shuping G. [place], MD  [facility]

## 2024-07-03 NOTE — Assessment & Plan Note (Signed)
 Reports problem as stable, PHQ score went up from 0 to 11. Continue Citalopram  10 mg daily.

## 2024-07-03 NOTE — Assessment & Plan Note (Signed)
 Continue pravastatin  20 mg daily and low-fat diet. Last LDL 41 in 07/2023, TG 216, non HDL 85. Today she is not fasting, so we will plan on fasting lipid panel at next visit.

## 2024-07-03 NOTE — Patient Instructions (Addendum)
 A few things to remember from today's visit:  Chronic pain disorder  Chronic low back pain, unspecified back pain laterality, unspecified whether sciatica present  Hyperlipidemia, unspecified hyperlipidemia type  Essential hypertension, benign - Plan: Basic metabolic panel with GFR, CBC  Localized osteoporosis without current pathological fracture  Fall, initial encounter - Plan: DG Bone Density, DG Ribs Unilateral Right  Acute right-sided thoracic back pain - Plan: DG Thoracic Spine W/Swimmers  Costal margin pain - Plan: DG Ribs Unilateral Right  Iron deficiency anemia due to chronic blood loss - Plan: CBC  No changes today. We can check cholesterol next visit. Continue monitoring blood pressure at home.  If you need refills for medications you take chronically, please call your pharmacy. Do not use My Chart to request refills or for acute issues that need immediate attention. If you send a my chart message, it may take a few days to be addressed, specially if I am not in the office.  Please be sure medication list is accurate. If a new problem present, please set up appointment sooner than planned today.

## 2024-07-05 ENCOUNTER — Inpatient Hospital Stay (INDEPENDENT_AMBULATORY_CARE_PROVIDER_SITE_OTHER)
Admission: RE | Admit: 2024-07-05 | Discharge: 2024-07-05 | Discharge status: Home / Self Care | Source: Ambulatory Visit | Attending: Family Medicine | Admitting: Family Medicine

## 2024-07-05 DIAGNOSIS — M816 Localized osteoporosis [Lequesne]: Secondary | ICD-10-CM

## 2024-07-06 ENCOUNTER — Other Ambulatory Visit: Payer: Self-pay | Admitting: Family Medicine

## 2024-07-06 DIAGNOSIS — F330 Major depressive disorder, recurrent, mild: Secondary | ICD-10-CM

## 2024-07-17 ENCOUNTER — Other Ambulatory Visit: Payer: Self-pay | Admitting: Family Medicine

## 2024-07-17 DIAGNOSIS — M9903 Segmental and somatic dysfunction of lumbar region: Secondary | ICD-10-CM | POA: Diagnosis not present

## 2024-07-17 DIAGNOSIS — E785 Hyperlipidemia, unspecified: Secondary | ICD-10-CM

## 2024-07-17 DIAGNOSIS — M5136 Other intervertebral disc degeneration, lumbar region with discogenic back pain only: Secondary | ICD-10-CM | POA: Diagnosis not present

## 2024-07-24 DIAGNOSIS — M9903 Segmental and somatic dysfunction of lumbar region: Secondary | ICD-10-CM | POA: Diagnosis not present

## 2024-07-24 DIAGNOSIS — M5136 Other intervertebral disc degeneration, lumbar region with discogenic back pain only: Secondary | ICD-10-CM | POA: Diagnosis not present

## 2024-07-30 ENCOUNTER — Ambulatory Visit: Payer: Medicare Other

## 2024-07-30 VITALS — Ht <= 58 in | Wt 130.0 lb

## 2024-07-30 DIAGNOSIS — Z0000 Encounter for general adult medical examination without abnormal findings: Secondary | ICD-10-CM

## 2024-07-30 NOTE — Progress Notes (Signed)
 Subjective:   Tabitha Adams is a 78 y.o. who presents for a Medicare Wellness preventive visit.  As a reminder, Annual Wellness Visits don't include a physical exam, and some assessments may be limited, especially if this visit is performed virtually. We may recommend an in-person follow-up visit with your provider if needed.  Visit Complete: Virtual I connected with  Tabitha Adams on 07/30/24 by a audio enabled telemedicine application and verified that I am speaking with the correct person using two identifiers.  Patient Location: Home  Provider Location: Home Office  I discussed the limitations of evaluation and management by telemedicine. The patient expressed understanding and agreed to proceed.  Vital Signs: Because this visit was a virtual/telehealth visit, some criteria may be missing or patient reported. Any vitals not documented were not able to be obtained and vitals that have been documented are patient reported.    Persons Participating in Visit: Patient.  AWV Questionnaire: Yes: Patient Medicare AWV questionnaire was completed by the patient on 07/23/24; I have confirmed that all information answered by patient is correct and no changes since this date.  Cardiac Risk Factors include: advanced age (>32men, >77 women);hypertension     Objective:    Today's Vitals   07/30/24 1450  Weight: 130 lb (59 kg)  Height: 4' 10 (1.473 m)   Body mass index is 27.17 kg/m.     07/30/2024    2:58 PM 07/27/2023    3:12 PM 07/22/2022    2:12 PM 07/13/2022    1:27 PM 04/19/2022    5:14 PM 07/16/2021    3:29 PM 09/01/2020    2:30 PM  Advanced Directives  Does Patient Have a Medical Advance Directive? Yes Yes Yes Yes No Yes Yes  Type of Estate agent of [place];Living will Healthcare Power of [place];Living will Healthcare Power of [place];Living will   Healthcare Power of [place];Living will Healthcare Power of [place];Living will  Does  patient want to make changes to medical advance directive?    No - Patient declined     Copy of Healthcare Power of Attorney in Chart? No - copy requested No - copy requested No - copy requested   No - copy requested   Would patient like information on creating a medical advance directive?     No - Patient declined      Current Medications (verified) Outpatient Encounter Medications as of 07/30/2024  Medication Sig   alendronate  (FOSAMAX ) 70 MG tablet TAKE 1 TABLET BY MOUTH WEEKLY  WITH 8 OZ OF PLAIN WATER 30  MINUTES BEFORE FIRST FOOD, DRINK OR MEDS. STAY UPRIGHT FOR 30  MINS   amLODipine  (NORVASC ) 2.5 MG tablet Take 1 tablet (2.5 mg total) by mouth daily.   Apoaequorin (PREVAGEN EXTRA STRENGTH) 20 MG CAPS Take 1 tablet by mouth daily.   b complex vitamins capsule Take 1 capsule by mouth daily.   Calcium-Phosphorus-Vitamin D 250-100-500 MG-MG-UNIT CHEW Chew by mouth.   cetirizine (ZYRTEC) 10 MG tablet Take 10 mg by mouth at bedtime.   chlorhexidine (PERIDEX) 0.12 % solution Use as directed 15 mLs in the mouth or throat 2 (two) times daily.   Cholecalciferol (VITAMIN D3) 50 MCG (2000 UT) TABS Take by mouth daily.   citalopram  (CELEXA ) 10 MG tablet TAKE 1 TABLET(10 MG) BY MOUTH DAILY   clobetasol  cream (TEMOVATE ) 0.05 % Bid on affected area for 14 days and then daily.   famotidine  (PEPCID ) 20 MG tablet Take 1 tablet (20 mg total)  by mouth daily. Before dinner   fluticasone  (FLONASE ) 50 MCG/ACT nasal spray SHAKE LIQUID AND USE 1 SPRAY IN EACH NOSTRIL TWICE DAILY AS NEEDED FOR ALLERGIES OR RHINITIS   glucosamine-chondroitin 500-400 MG tablet Take 1 tablet by mouth 2 (two) times daily.   losartan  (COZAAR ) 50 MG tablet TAKE 1 TABLET BY MOUTH DAILY   meclizine  (ANTIVERT ) 25 MG tablet TAKE 1/2 TO 1 TABLET BY  MOUTH TWICE DAILY AS NEEDED FOR DIZZINESS   meloxicam  (MOBIC ) 15 MG tablet TAKE 1 TABLET BY MOUTH DAILY   montelukast  (SINGULAIR ) 10 MG tablet TAKE 1 TABLET BY MOUTH AT  BEDTIME   Multiple  Vitamin (MULTIVITAMIN WITH MINERALS) TABS tablet Take 1 tablet by mouth daily.   omeprazole  (PRILOSEC) 40 MG capsule Take 1 capsule (40 mg total) by mouth daily.   polyethylene glycol (MIRALAX / GLYCOLAX) 17 g packet Take 17 g by mouth daily.   pravastatin  (PRAVACHOL ) 20 MG tablet TAKE 1 TABLET BY MOUTH DAILY   traMADol  (ULTRAM ) 50 MG tablet Take 1 tablet (50 mg total) by mouth every 12 (twelve) hours as needed.   vitamin C  (VITAMIN C ) 1000 MG tablet Take 1 tablet (1,000 mg total) by mouth daily.   No facility-administered encounter medications on file as of 07/30/2024.    Allergies (verified) Cymbalta  [duloxetine  hcl], Elemental sulfur, and Penicillins   History: Past Medical History:  Diagnosis Date   Allergy    Anemia    Anxiety 2018   Arthritis    Cataract 2015   had them removed   Depression 2018   GERD (gastroesophageal reflux disease)    Hyperlipidemia    Hypertension 2019   Past Surgical History:  Procedure Laterality Date   APPENDECTOMY     BACK SURGERY     BREAST CYST ASPIRATION Left    BREAST EXCISIONAL BIOPSY Left    BREAST EXCISIONAL BIOPSY Left    CATARACT EXTRACTION, BILATERAL     Dental Impalnt  10/21/2023   2 Implants   EYE SURGERY  2015   FRACTURE SURGERY  2017   ORIF WRIST FRACTURE Right 01/25/2017   Procedure: OPEN REDUCTION INTERNAL FIXATION (ORIF) WRIST FRACTURE;  Surgeon: Elsie Mussel, MD;  Location: [facility] OR;  Service: Orthopedics;  Laterality: Right;   TOOTH EXTRACTION  10/21/2023   3 teeth removed   TOTAL ABDOMINAL HYSTERECTOMY     heavy periods   TUBAL LIGATION  1973   Family History  Problem Relation Age of Onset   Other Mother        brain tumor, non cancerous   Early death Mother    Varicose Veins Mother    Stroke Father    Hypertension Father    Dementia Father    Arthritis Sister    Hypercholesterolemia Sister    Cancer Sister    Varicose Veins Sister    Kidney disease Maternal Grandmother    Epilepsy Son    Heart Problems  Son    Other Son        breathing problems   Tremor Son    Other Brother        MVA   Social History   Socioeconomic History   Marital status: Widowed    Spouse name: Not on file   Number of children: 3   Years of education: Not on file   Highest education level: Associate degree: occupational, Scientist, product/process development, or vocational program  Occupational History   Occupation: Lobbyist: [employer]    Comment: works  25-30hrs/week  Tobacco Use   Smoking status: Former    Types: Cigarettes   Smokeless tobacco: Never   Tobacco comments:    Smoked from 60 to 1985 max 1.25 PPD  Vaping Use   Vaping status: Never Used  Substance and Sexual Activity   Alcohol use: No   Drug use: No   Sexual activity: Not Currently    Comment: 1st intercourse 78 yo-Fewer than 5 partners  Other Topics Concern   Not on file  Social History Narrative   01/22/2019:   Widowed since 10/2017; lives alone now in [place] with 2 dogs   Has one son, and one stepson and Museum/gallery conservator. Sons live close by, supportive.   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, nature, likes to stay busy   [employer] community support system   Social Drivers of Health   Financial Resource Strain: Low Risk  (07/30/2024)   Overall Financial Resource Strain (CARDIA)    Difficulty of Paying Living Expenses: Not hard at all  Recent Concern: Financial Resource Strain - Medium Risk (06/29/2024)   Overall Financial Resource Strain (CARDIA)    Difficulty of Paying Living Expenses: Somewhat hard  Food Insecurity: No Food Insecurity (07/30/2024)   Hunger Vital Sign    Worried About Running Out of Food in the Last Year: Never true    Ran Out of Food in the Last Year: Never true  Transportation Needs: No Transportation Needs (07/30/2024)   PRAPARE - Administrator, Civil Service (Medical): No    Lack of Transportation (Non-Medical): No  Physical Activity: Inactive  (07/30/2024)   Exercise Vital Sign    Days of Exercise per Week: 0 days    Minutes of Exercise per Session: 0 min  Stress: No Stress Concern Present (07/30/2024)   [employer] of Occupational Health - Occupational Stress Questionnaire    Feeling of Stress: Not at all  Recent Concern: Stress - Stress Concern Present (06/29/2024)   [employer] of Occupational Health - Occupational Stress Questionnaire    Feeling of Stress: To some extent  Social Connections: Moderately Integrated (07/30/2024)   Social Connection and Isolation Panel    Frequency of Communication with Friends and Family: More than three times a week    Frequency of Social Gatherings with Friends and Family: More than three times a week    Attends Religious Services: More than 4 times per year    Active Member of [employer] or Organizations: Yes    Attends Banker Meetings: More than 4 times per year    Marital Status: Widowed    Tobacco Counseling Counseling given: Not Answered Tobacco comments: Smoked from 55 to 1985 max 1.25 PPD    Clinical Intake:  Pre-visit preparation completed: Yes  Pain : No/denies pain     BMI - recorded: 27.17 Nutritional Status: BMI 25 -29 Overweight Nutritional Risks: None Diabetes: No  Lab Results  Component Value Date   HGBA1C 5.5 08/01/2023   HGBA1C 5.6 09/02/2021   HGBA1C 5.8 02/24/2021     How often do you need to have someone help you when you read instructions, pamphlets, or other written materials from your doctor or pharmacy?: 1 - Never  Interpreter Needed?: No  Information entered by :: Tabitha Blush LPN   Activities of Daily Living     07/30/2024    2:56 PM 07/23/2024    5:20 PM  In your present state  of health, do you have any difficulty performing the following activities:  Hearing? 0 0  Vision? 0 0  Difficulty concentrating or making decisions? 0 0  Walking or climbing stairs? 0 0  Dressing or bathing? 0 0  Doing errands, shopping? 0  0  Preparing Food and eating ? N N  Using the Toilet? N N  In the past six months, have you accidently leaked urine? Tabitha Adams  Comment Wears Breifs. Followed by PCP   Do you have problems with loss of bowel control? N N  Managing your Medications? N N  Managing your Finances? N N  Housekeeping or managing your Housekeeping? N N    Patient Care Team: [place], [name], MD as PCP - General (Family Medicine) [facility] Sewer, OD (Optometry) Liane Sharyne MATSU, [facility] (Inactive) as Pharmacist (Pharmacist)  I have updated your Care Teams any recent Medical Services you may have received from other providers in the past year.     Assessment:   This is a routine wellness examination for Tabitha Adams.  Hearing/Vision screen Hearing Screening - Comments:: Denies hearing difficulties   Vision Screening - Comments:: Wears rx glasses - up to date with routine eye exams with  [facility]   Goals Addressed               This Visit's Progress     Increase physical activity (pt-stated)        Get more active.       Depression Screen     07/30/2024    2:55 PM 07/03/2024    8:40 AM 08/01/2023    2:05 PM 07/27/2023    3:08 PM 09/27/2022    3:03 PM 08/13/2022    3:32 PM 07/22/2022    2:06 PM  PHQ 2/9 Scores  PHQ - 2 Score 0 4 0 0 0 0 0  PHQ- 9 Score 0 11 0   0 0    Fall Risk     07/30/2024    2:57 PM 07/23/2024    5:20 PM 01/29/2024    5:42 PM 07/25/2023    3:00 PM 09/27/2022    3:03 PM  Fall Risk   Falls in the past year? 1 1 1 1  0  Number falls in past yr: 0 1 1 1  0  Injury with Fall? 0 1 0 1 0  Comment No injury. Followed by medical attention   Dislocated rt thumb. Followed by medical attention   Risk for fall due to : No Fall Risks    History of fall(s)  Follow up Falls evaluation completed    Falls evaluation completed      Data saved with a previous flowsheet row definition    MEDICARE RISK AT HOME:  Medicare Risk at Home Any stairs in or around the home?: Yes If so, are there any  without handrails?: No Home free of loose throw rugs in walkways, pet beds, electrical cords, etc?: Yes Adequate lighting in your home to reduce risk of falls?: Yes Life alert?: No Use of a cane, walker or w/c?: Yes Grab bars in the bathroom?: No Shower chair or bench in shower?: Yes Elevated toilet seat or a handicapped toilet?: Yes  TIMED UP AND GO:  Was the test performed?  No  Cognitive Function: 6CIT completed        07/30/2024    2:59 PM 07/27/2023    3:12 PM 07/22/2022    2:12 PM  6CIT Screen  What Year? 0 points 0  points 0 points  What month? 0 points 0 points 0 points  What time? 0 points 0 points 0 points  Count back from 20 0 points 0 points 0 points  Months in reverse 0 points 0 points 0 points  Repeat phrase 0 points 0 points 0 points  Total Score 0 points 0 points 0 points    Immunizations Immunization History  Administered Date(s) Administered   Fluad Quad(high Dose 65+) 08/06/2019, 08/28/2021, 08/13/2022   Fluad Trivalent(High Dose 65+) 08/01/2023   INFLUENZA, HIGH DOSE SEASONAL PF 08/31/2016, 09/07/2017, 07/24/2018   PFIZER(Purple Top)SARS-COV-2 Vaccination 01/05/2020, 01/30/2020, 11/03/2020   PNEUMOCOCCAL CONJUGATE-20 04/11/2022   Pneumococcal Conjugate-13 01/22/2019   Pneumococcal Polysaccharide-23 07/06/2020   Tdap 11/16/2015, 08/04/2022   Zoster Recombinant(Shingrix) 11/25/2020, 04/06/2021    Screening Tests Health Maintenance  Topic Date Due   COVID-19 Vaccine (4 - 2025-26 season) 07/16/2024   Influenza Vaccine  02/12/2025 (Originally 06/15/2024)   Medicare Annual Wellness (AWV)  07/30/2025   DTaP/Tdap/Td (3 - Td or Tdap) 08/04/2032   Pneumococcal Vaccine: 50+ Years  Completed   DEXA SCAN  Completed   Hepatitis C Screening  Completed   Zoster Vaccines- Shingrix  Completed   HPV VACCINES  Aged Out   Meningococcal B Vaccine  Aged Out   Mammogram  Discontinued   Colonoscopy  Discontinued    Health Maintenance Items Addressed:   Additional  Screening:  Vision Screening: Recommended annual ophthalmology exams for early detection of glaucoma and other disorders of the eye. Is the patient up to date with their annual eye exam?  Yes  Who is the provider or what is the name of the office in which the patient attends annual eye exams? [facility]  Dental Screening: Recommended annual dental exams for proper oral hygiene  Community Resource Referral / Chronic Care Management: CRR required this visit?  No   CCM required this visit?  No   Plan:    I have personally reviewed and noted the following in the patient's chart:   Medical and social history Use of alcohol, tobacco or illicit drugs  Current medications and supplements including opioid prescriptions. Patient is currently taking opioid prescriptions. Information provided to patient regarding non-opioid alternatives. Patient advised to discuss non-opioid treatment plan with their provider. Functional ability and status Nutritional status Physical activity Advanced directives List of other physicians Hospitalizations, surgeries, and ER visits in previous 12 months Vitals Screenings to include cognitive, depression, and falls Referrals and appointments  In addition, I have reviewed and discussed with patient certain preventive protocols, quality metrics, and best practice recommendations. A written personalized care plan for preventive services as well as general preventive health recommendations were provided to patient.   Tabitha LELON Blush, LPN   [id number]   After Visit Summary: (MyChart) Due to this being a telephonic visit, the after visit summary with patients personalized plan was offered to patient via MyChart   Notes: Nothing significant to report at this time.

## 2024-07-30 NOTE — Patient Instructions (Addendum)
 Tabitha Adams,  Thank you for taking the time for your Medicare Wellness Visit. I appreciate your continued commitment to your health goals. Please review the care plan we discussed, and feel free to reach out if I can assist you further.  Medicare recommends these wellness visits once per year to help you and your care team stay ahead of potential health issues. These visits are designed to focus on prevention, allowing your provider to concentrate on managing your acute and chronic conditions during your regular appointments.  Please note that Annual Wellness Visits do not include a physical exam. Some assessments may be limited, especially if the visit was conducted virtually. If needed, we may recommend a separate in-person follow-up with your provider.  Ongoing Care Seeing your primary care provider every 3 to 6 months helps us  monitor your health and provide consistent, personalized care.   Referrals If a referral was made during today's visit and you haven't received any updates within two weeks, please contact the referred provider directly to check on the status.  Recommended Screenings:  Health Maintenance  Topic Date Due   COVID-19 Vaccine (4 - 2025-26 season) 07/16/2024   Flu Shot  02/12/2025*   Medicare Annual Wellness Visit  07/30/2025   DTaP/Tdap/Td vaccine (3 - Td or Tdap) 08/04/2032   Pneumococcal Vaccine for age over 20  Completed   DEXA scan (bone density measurement)  Completed   Hepatitis C Screening  Completed   Zoster (Shingles) Vaccine  Completed   HPV Vaccine  Aged Out   Meningitis B Vaccine  Aged Out   Breast Cancer Screening  Discontinued   Colon Cancer Screening  Discontinued  *Topic was postponed. The date shown is not the original due date.   Opioid Pain Medicine Management Opioids are powerful medicines that are used to treat moderate to severe pain. When used for short periods of time, they can help you to: Sleep better. Do better in physical or  occupational therapy. Feel better in the first few days after an injury. Recover from surgery. Opioids should be taken with the supervision of a trained health care provider. They should be taken for the shortest period of time possible. This is because opioids can be addictive, and the longer you take opioids, the greater your risk of addiction. This addiction can also be called opioid use disorder. What are the risks? Using opioid pain medicines for longer than 3 days increases your risk of side effects. Side effects include: Constipation. Nausea and vomiting. Breathing difficulties (respiratory depression). Drowsiness. Confusion. Opioid use disorder. Itching. Taking opioid pain medicine for a long period of time can affect your ability to do daily tasks. It also puts you at risk for: Motor vehicle crashes. Depression. Suicide. Heart attack. Overdose, which can be life-threatening. What is a pain treatment plan? A pain treatment plan is an agreement between you and your health care provider. Pain is unique to each person, and treatments vary depending on your condition. To manage your pain, you and your health care provider need to work together. To help you do this: Discuss the goals of your treatment, including how much pain you might expect to have and how you will manage the pain. Review the risks and benefits of taking opioid medicines. Remember that a good treatment plan uses more than one approach and minimizes the chance of side effects. Be honest about the amount of medicines you take and about any drug or alcohol use. Get pain medicine prescriptions from only one  health care provider. Pain can be managed with many types of alternative treatments. Ask your health care provider to refer you to one or more specialists who can help you manage pain through: Physical or occupational therapy. Counseling (cognitive behavioral therapy). Good  nutrition. Biofeedback. Massage. Meditation. Non-opioid medicine. Following a gentle exercise program. How to use opioid pain medicine Taking medicine Take your pain medicine exactly as told by your health care provider. Take it only when you need it. If your pain gets less severe, you may take less than your prescribed dose if your health care provider approves. If you are not having pain, do nottake pain medicine unless your health care provider tells you to take it. If your pain is severe, do nottry to treat it yourself by taking more pills than instructed on your prescription. Contact your health care provider for help. Write down the times when you take your pain medicine. It is easy to become confused while on pain medicine. Writing the time can help you avoid overdose. Take other over-the-counter or prescription medicines only as told by your health care provider. Keeping yourself and others safe  While you are taking opioid pain medicine: Do not drive, use machinery, or power tools. Do not sign legal documents. Do not drink alcohol. Do not take sleeping pills. Do not supervise children by yourself. Do not do activities that require climbing or being in high places. Do not go to a lake, river, ocean, spa, or swimming pool. Do not share your pain medicine with anyone. Keep pain medicine in a locked cabinet or in a secure area where pets and children cannot reach it. Stopping your use of opioids If you have been taking opioid medicine for more than a few weeks, you may need to slowly decrease (taper) how much you take until you stop completely. Tapering your use of opioids can decrease your risk of symptoms of withdrawal, such as: Pain and cramping in the abdomen. Nausea. Sweating. Sleepiness. Restlessness. Uncontrollable shaking (tremors). Cravings for the medicine. Do not attempt to taper your use of opioids on your own. Talk with your health care provider about how to do  this. Your health care provider may prescribe a step-down schedule based on how much medicine you are taking and how long you have been taking it. Getting rid of leftover pills Do not save any leftover pills. Get rid of leftover pills safely by: Taking the medicine to a prescription take-back program. This is usually offered by the county or law enforcement. Bringing them to a pharmacy that has a drug disposal container. Flushing them down the toilet. Check the label or package insert of your medicine to see whether this is safe to do. Throwing them out in the trash. Check the label or package insert of your medicine to see whether this is safe to do. If it is safe to throw it out, remove the medicine from the original container, put it into a sealable bag or container, and mix it with used coffee grounds, food scraps, dirt, or cat litter before putting it in the trash. Follow these instructions at home: Activity Do exercises as told by your health care provider. Avoid activities that make your pain worse. Return to your normal activities as told by your health care provider. Ask your health care provider what activities are safe for you. General instructions You may need to take these actions to prevent or treat constipation: Drink enough fluid to keep your urine pale yellow. Take over-the-counter  or prescription medicines. Eat foods that are high in fiber, such as beans, whole grains, and fresh fruits and vegetables. Limit foods that are high in fat and processed sugars, such as fried or sweet foods. Keep all follow-up visits. This is important. Where to find support If you have been taking opioids for a long time, you may benefit from receiving support for quitting from a local support group or counselor. Ask your health care provider for a referral to these resources in your area. Where to find more information Centers for Disease Control and Prevention (CDC): FootballExhibition.com.br [place] Food and  Drug Administration (FDA): PumpkinSearch.com.ee Get help right away if: You may have taken too much of an opioid (overdosed). Common symptoms of an overdose: Your breathing is slower or more shallow than normal. You have a very slow heartbeat (pulse). You have slurred speech. You have nausea and vomiting. Your pupils become very small. You have other potential symptoms: You are very confused. You faint or feel like you will faint. You have cold, clammy skin. You have blue lips or fingernails. You have thoughts of harming yourself or harming others. These symptoms may represent a serious problem that is an emergency. Do not wait to see if the symptoms will go away. Get medical help right away. Call your local emergency services (911 in the [place] Do not drive yourself to the hospital.  If you ever feel like you may hurt yourself or others, or have thoughts about taking your own life, get help right away. Go to your nearest emergency department or: Call your local emergency services (911 in the [place] Call the [facility] (775-405-2061 in the [place] Call a suicide crisis helpline, such as the National Suicide Prevention Lifeline at (425) 555-1146 or 988 in the [place] This is open 24 hours a day in the [place] If you're a Veteran: Call 988 and press 1. This is open 24 hours a day. Text the [employer] at 218-570-0625. Summary Opioid medicines can help you manage moderate to severe pain for a short period of time. A pain treatment plan is an agreement between you and your health care provider. Discuss the goals of your treatment, including how much pain you might expect to have and how you will manage the pain. If you think that you or someone else may have taken too much of an opioid, get medical help right away. This information is not intended to replace advice given to you by your health care provider. Make sure you discuss any questions you have with your health care  provider. Document Revised: 08/08/2023 Document Reviewed: 02/11/2021 Elsevier Patient Education  2024 Elsevier Inc.    07/30/2024    2:58 PM  Advanced Directives  Does Patient Have a Medical Advance Directive? Yes  Type of Estate agent of [place];Living will  Copy of Healthcare Power of Attorney in Chart? No - copy requested   Advance Care Planning is important because it: Ensures you receive medical care that aligns with your values, goals, and preferences. Provides guidance to your family and loved ones, reducing the emotional burden of decision-making during critical moments.  Vision: Annual vision screenings are recommended for early detection of glaucoma, cataracts, and diabetic retinopathy. These exams can also reveal signs of chronic conditions such as diabetes and high blood pressure.  Dental: Annual dental screenings help detect early signs of oral cancer, gum disease, and other conditions linked to overall health, including heart disease and diabetes.  Please see  the attached documents for additional preventive care recommendations.

## 2024-07-31 DIAGNOSIS — M5136 Other intervertebral disc degeneration, lumbar region with discogenic back pain only: Secondary | ICD-10-CM | POA: Diagnosis not present

## 2024-07-31 DIAGNOSIS — M9903 Segmental and somatic dysfunction of lumbar region: Secondary | ICD-10-CM | POA: Diagnosis not present

## 2024-08-06 ENCOUNTER — Telehealth: Payer: Self-pay

## 2024-08-06 DIAGNOSIS — M816 Localized osteoporosis [Lequesne]: Secondary | ICD-10-CM

## 2024-08-06 MED ORDER — DENOSUMAB 60 MG/ML ~~LOC~~ SOSY: 60.0000 mg | PREFILLED_SYRINGE | Freq: Once | SUBCUTANEOUS | Status: AC

## 2024-08-06 NOTE — Telephone Encounter (Signed)
 Discussed Prolia  medication with patient. Advised patient the medication will need to be sent for PA. Advised patient follow up call will be made once PA is received. Patient voiced understanding.

## 2024-08-07 ENCOUNTER — Telehealth: Payer: Self-pay

## 2024-08-07 ENCOUNTER — Other Ambulatory Visit (HOSPITAL_COMMUNITY): Payer: Self-pay

## 2024-08-07 NOTE — Telephone Encounter (Signed)
 Tabitha Adams is preferred for pharmacy benefit. Ran test claim, patient's copay will be $542.51 for Tabitha Adams.

## 2024-08-07 NOTE — Telephone Encounter (Signed)
 Pt ready for scheduling for PROLIA  on or after : 08/07/24  Option# 1: Buy/Bill (Office supplied medication)  Out-of-pocket cost due at time of clinic visit: $352  Number of injection/visits approved: 2  Primary: UHC-MEDICARE Prolia  co-insurance: 20% Admin fee co-insurance: $20  Secondary: --- Prolia  co-insurance:  Admin fee co-insurance:   Medical Benefit Details: Date Benefits were checked: 08/07/24 Deductible: NO/ Coinsurance: 20%/ Admin Fee: $20  Prior Auth: APPROVED PA# [id number] Expiration Date: 08/07/24-08/07/25  # of doses approved: 2 ----------------------------------------------------------------------- Option# 2- Med Obtained from pharmacy: [facility] is now preferred. PRICING IS FOR JUBBONTI  Pharmacy benefit: Copay $542.51 (Paid to pharmacy) Admin Fee: $20 (Pay at clinic)  Prior Auth: N/A PA# Expiration Date:   # of doses approved:   If patient wants fill through the pharmacy benefit please send prescription to: [facility], and include estimated need by date in rx notes. Pharmacy will ship medication directly to the office.  Patient NOT eligible for Prolia  Copay Card. Copay Card can make patient's cost as little as $25. Link to apply: [email or web address]://[email or web address]  ** This summary of benefits is an estimation of the patient's out-of-pocket cost. Exact cost may very based on individual plan coverage.

## 2024-08-07 NOTE — Telephone Encounter (Signed)
 SABRA

## 2024-08-07 NOTE — Telephone Encounter (Signed)
 Prolia  VOB initiated via [email or web address]  Next Prolia  inj DUE: NEW START

## 2024-08-07 NOTE — Telephone Encounter (Signed)
 BUY AND BILL PA APPROVED

## 2024-08-14 DIAGNOSIS — M5136 Other intervertebral disc degeneration, lumbar region with discogenic back pain only: Secondary | ICD-10-CM | POA: Diagnosis not present

## 2024-08-14 DIAGNOSIS — M9903 Segmental and somatic dysfunction of lumbar region: Secondary | ICD-10-CM | POA: Diagnosis not present

## 2024-08-20 NOTE — Telephone Encounter (Signed)
 Spoke with patient regarding Prolia  medication and out of pocket cost of $352. Patient states she cannot afford medication. Patient states she would like to continue on her current regimen. Advised patient, Dr. [place] would be notified.

## 2024-08-28 DIAGNOSIS — M9903 Segmental and somatic dysfunction of lumbar region: Secondary | ICD-10-CM | POA: Diagnosis not present

## 2024-08-28 DIAGNOSIS — M5136 Other intervertebral disc degeneration, lumbar region with discogenic back pain only: Secondary | ICD-10-CM | POA: Diagnosis not present

## 2024-08-29 ENCOUNTER — Other Ambulatory Visit: Payer: Self-pay | Admitting: Family Medicine

## 2024-08-29 DIAGNOSIS — G894 Chronic pain syndrome: Secondary | ICD-10-CM

## 2024-08-29 DIAGNOSIS — M159 Polyosteoarthritis, unspecified: Secondary | ICD-10-CM

## 2024-09-11 DIAGNOSIS — M47816 Spondylosis without myelopathy or radiculopathy, lumbar region: Secondary | ICD-10-CM | POA: Diagnosis not present

## 2024-09-16 ENCOUNTER — Other Ambulatory Visit: Payer: Self-pay | Admitting: Family Medicine

## 2024-10-27 ENCOUNTER — Other Ambulatory Visit: Payer: Self-pay | Admitting: Family Medicine

## 2024-10-27 DIAGNOSIS — I10 Essential (primary) hypertension: Secondary | ICD-10-CM

## 2024-10-29 ENCOUNTER — Other Ambulatory Visit: Payer: Self-pay | Admitting: Family Medicine

## 2024-10-29 DIAGNOSIS — K219 Gastro-esophageal reflux disease without esophagitis: Secondary | ICD-10-CM

## 2024-11-21 ENCOUNTER — Emergency Department (HOSPITAL_BASED_OUTPATIENT_CLINIC_OR_DEPARTMENT_OTHER)
Admission: EM | Admit: 2024-11-21 | Discharge: 2024-11-21 | Disposition: A | Discharge status: Home / Self Care | Payer: Worker's Compensation | Attending: Emergency Medicine | Admitting: Emergency Medicine

## 2024-11-21 ENCOUNTER — Emergency Department (HOSPITAL_BASED_OUTPATIENT_CLINIC_OR_DEPARTMENT_OTHER): Payer: Worker's Compensation

## 2024-11-21 DIAGNOSIS — W010XXA Fall on same level from slipping, tripping and stumbling without subsequent striking against object, initial encounter: Secondary | ICD-10-CM | POA: Insufficient documentation

## 2024-11-21 DIAGNOSIS — S161XXA Strain of muscle, fascia and tendon at neck level, initial encounter: Secondary | ICD-10-CM | POA: Diagnosis present

## 2024-11-21 DIAGNOSIS — W19XXXA Unspecified fall, initial encounter: Secondary | ICD-10-CM

## 2024-11-21 MED ORDER — LIDOCAINE 5 % EX PTCH: 1.0000 | MEDICATED_PATCH | CUTANEOUS | 0 refills | Status: AC

## 2024-11-21 NOTE — ED Triage Notes (Signed)
 Mechanical fall today while at work. Hit back of head. Denies LOC. Denies thinners.

## 2024-11-21 NOTE — Discharge Instructions (Addendum)
 As we discussed, your scans were normal.  You can take 600 mg of ibuprofen every 6 hours as needed.  You can use lidocaine  patches as needed.  Please follow-up with your primary care doctor for further evaluation.  You may return to the emergency department for any worsening symptoms.

## 2024-11-21 NOTE — ED Provider Notes (Signed)
 " [place] EMERGENCY DEPARTMENT AT [facility] Provider Note   CSN: [id number] Arrival date & time: 11/21/24  1027     Patient presents with: Tabitha Adams is a 79 y.o. female patient who presents to the emergency department today for further evaluation of mechanical trip and fall that occurred just prior to arrival.  Patient states that she was trying to open a cabinet that was stuck in upon applying more force she lost her balance falling backward striking her head against the ground.  She did not lose consciousness and she is not anticoagulated.  She denies any focal weakness or focal numbness.    Fall       Prior to Admission medications  Medication Sig Start Date End Date Taking? Authorizing Provider  lidocaine  (LIDODERM ) 5 % Place 1 patch onto the skin daily. Remove & Discard patch within 12 hours or as directed by MD 11/21/24  Yes Theotis, Jazari Ober M, PA-C  alendronate  (FOSAMAX ) 70 MG tablet TAKE 1 TABLET BY MOUTH WEEKLY  WITH 8 OZ OF PLAIN WATER 30  MINUTES BEFORE FIRST FOOD, DRINK OR MEDS. STAY UPRIGHT FOR 30  MINS 09/17/24   Jordan, [name], MD  amLODipine  (NORVASC ) 2.5 MG tablet Take 1 tablet (2.5 mg total) by mouth daily. 07/03/24   Jordan, [name], MD  Apoaequorin (PREVAGEN EXTRA STRENGTH) 20 MG CAPS Take 1 tablet by mouth daily.    [provider]  b complex vitamins capsule Take 1 capsule by mouth daily.    [provider]  Calcium-Phosphorus-Vitamin D 250-100-500 MG-MG-UNIT CHEW Chew by mouth.    [provider]  cetirizine (ZYRTEC) 10 MG tablet Take 10 mg by mouth at bedtime.    [provider]  chlorhexidine (PERIDEX) 0.12 % solution Use as directed 15 mLs in the mouth or throat 2 (two) times daily. 10/21/23   [provider]  Cholecalciferol (VITAMIN D3) 50 MCG (2000 UT) TABS Take by mouth daily.    [provider]  citalopram  (CELEXA ) 10 MG tablet TAKE 1 TABLET(10 MG) BY MOUTH DAILY 07/06/24    Jordan, [name], MD  clobetasol  cream (TEMOVATE ) 0.05 % Bid on affected area for 14 days and then daily. 09/27/22   Jordan, [name], MD  famotidine  (PEPCID ) 20 MG tablet Take 1 tablet (20 mg total) by mouth daily. Before dinner 09/19/23   Aneita Gwendlyn DASEN, MD  fluticasone  (FLONASE ) 50 MCG/ACT nasal spray SHAKE LIQUID AND USE 1 SPRAY IN EACH NOSTRIL TWICE DAILY AS NEEDED FOR ALLERGIES OR RHINITIS 04/30/24   Jordan, [name], MD  glucosamine-chondroitin 500-400 MG tablet Take 1 tablet by mouth 2 (two) times daily.    [provider]  losartan  (COZAAR ) 50 MG tablet TAKE 1 TABLET BY MOUTH DAILY 10/29/24   Jordan, [name], MD  meclizine  (ANTIVERT ) 25 MG tablet TAKE 1/2 TO 1 TABLET BY  MOUTH TWICE DAILY AS NEEDED FOR DIZZINESS 11/26/21   Jordan, [name], MD  meloxicam  (MOBIC ) 15 MG tablet TAKE 1 TABLET BY MOUTH DAILY 10/29/24   Jordan, [name], MD  montelukast  (SINGULAIR ) 10 MG tablet TAKE 1 TABLET BY MOUTH AT  BEDTIME 10/29/24   Jordan, [name], MD  Multiple Vitamin (MULTIVITAMIN WITH MINERALS) TABS tablet Take 1 tablet by mouth daily.    [provider]  omeprazole  (PRILOSEC) 40 MG capsule TAKE 1 CAPSULE(40 MG) BY MOUTH DAILY 10/29/24   Jordan, [name], MD  polyethylene glycol (MIRALAX / GLYCOLAX) 17 g packet  Take 17 g by mouth daily.    [provider]  pravastatin  (PRAVACHOL ) 20 MG tablet TAKE 1 TABLET BY MOUTH DAILY 07/17/24   Jordan, [name], MD  traMADol  (ULTRAM ) 50 MG tablet Take 1 tablet (50 mg total) by mouth every 12 (twelve) hours as needed. 08/31/24   Jordan, [name], MD  vitamin C  (VITAMIN C ) 1000 MG tablet Take 1 tablet (1,000 mg total) by mouth daily. 01/26/17   Juliane Ozell PARAS, PA-C    Allergies: Cymbalta  [duloxetine  hcl], Elemental sulfur, and Penicillins    Review of Systems  All other systems reviewed and are negative.   Updated Vital Signs BP 139/70   Pulse 79   Temp 98.1 F (36.7 C) (Oral)   Resp 17   SpO2 97%   Physical Exam Vitals and nursing  note reviewed.  Constitutional:      General: She is not in acute distress.    Appearance: Normal appearance.  HENT:     Head: Normocephalic and atraumatic.  Eyes:     General:        Right eye: No discharge.        Left eye: No discharge.  Neck:     Comments: There is no midline cervical spinal tenderness.  There is left paracervical muscular tenderness into this. Cardiovascular:     Comments: Regular rate and rhythm.  S1/S2 are distinct without any evidence of murmur, rubs, or gallops.  Radial pulses are 2+ bilaterally.  Dorsalis pedis pulses are 2+ bilaterally.  No evidence of pedal edema. Pulmonary:     Comments: Clear to auscultation bilaterally.  Normal effort.  No respiratory distress.  No evidence of wheezes, rales, or rhonchi heard throughout. Abdominal:     General: Abdomen is flat. Bowel sounds are normal. There is no distension.     Tenderness: There is no abdominal tenderness. There is no guarding or rebound.  Musculoskeletal:        General: Normal range of motion.     Cervical back: Neck supple.  Skin:    General: Skin is warm and dry.     Findings: No rash.  Neurological:     General: No focal deficit present.     Mental Status: She is alert.  Psychiatric:        Mood and Affect: Mood normal.        Behavior: Behavior normal.     (all labs ordered are listed, but only abnormal results are displayed) Labs Reviewed - No data to display  EKG: None  Radiology: CT Cervical Spine Wo Contrast Result Date: 11/21/2024 EXAM: CT CERVICAL SPINE WITHOUT CONTRAST 11/21/2024 11:06:02 AM TECHNIQUE: CT of the cervical spine was performed without the administration of intravenous contrast. Multiplanar reformatted images are provided for review. Automated exposure control, iterative reconstruction, and/or weight based adjustment of the mA/kV was utilized to reduce the radiation dose to as low as reasonably achievable. COMPARISON: CT cervical spine 04/19/2022. CLINICAL HISTORY:  fall FINDINGS: BONES AND ALIGNMENT: No acute fracture or traumatic malalignment of the cervical spine. There is overall similar 4 mm anterolisthesis at C4 on C5, 3 mm anterolisthesis of C7 on T1, and trace stepwise anterolisthesis of T1 on T2 and T2 on T3 without evidence of acute/traumatic vertebral column listhesis. There is diffuse osseous demineralization. DEGENERATIVE CHANGES: There is multilevel intervertebral disc height loss most pronounced at C5-C6 and C6-C7. There is uncovertebral spurring and facet arthropathy contributing to varying degrees of bilateral neural foraminal stenosis at multiple levels.  SOFT TISSUES: No prevertebral soft tissue swelling. Air-fluid level right maxillary sinus with aerated secretions. IMPRESSION: 1. No acute fracture or traumatic malalignment of the cervical spine. 2. Air-fluid level in the right maxillary sinus with aerated secretions, which can be seen with acute sinusitis. Electronically signed by: Prentice Spade MD 11/21/2024 11:50 AM EST RP Workstation: [id number]   CT Head Wo Contrast Result Date: 11/21/2024 EXAM: CT HEAD WITHOUT CONTRAST 11/21/2024 11:06:02 AM TECHNIQUE: CT of the head was performed without the administration of intravenous contrast. Automated exposure control, iterative reconstruction, and/or weight based adjustment of the mA/kV was utilized to reduce the radiation dose to as low as reasonably achievable. COMPARISON: Head CT 04/19/2022. CLINICAL HISTORY: fall FINDINGS: BRAIN AND VENTRICLES: No acute hemorrhage. No evidence of acute territorial infarct. No hydrocephalus. No extra-axial collection. No mass effect or midline shift. Overall similar severe scattered white matter hypodensities which are nonspecific but most commonly represent chronic microvascular ischemic changes. ORBITS: Bilateral lens replacement. SINUSES: Fluid level right maxillary sinus. SOFT TISSUES AND SKULL: No acute soft tissue abnormality. No calvarial fracture. IMPRESSION: 1. No  acute intracranial hemorrhage or calvarial fracture. 2. Right maxillary sinus air-fluid level, correlate for sinusitis. Electronically signed by: Prentice Spade MD 11/21/2024 11:27 AM EST RP Workstation: [id number]     Procedures   Medications Ordered in the ED - No data to display   Medical Decision Making Tabitha Adams is a 79 y.o. female patient who presents to the emergency department today for further evaluation of a mechanical fall.  Imaging was performed in triage interpreted by myself.  No evidence of intracranial hemorrhage or cervical spinal pathology.  Offered patient NSAIDs here which she declined she said she was take at home.  Will prescribe her some lidocaine  patches.  Likely cervical strain.  Strict turn precautions were discussed.  Will plan to discharge home with primary care follow-up.   Amount and/or Complexity of Data Reviewed Radiology: ordered.  Risk Prescription drug management.     Final diagnoses:  Strain of neck muscle, initial encounter  Fall, initial encounter    ED Discharge Orders          Ordered    lidocaine  (LIDODERM ) 5 %  Every 24 hours        11/21/24 1320               Theotis Peers [place], [place] 11/21/24 1330    Jerrol Agent, MD 11/21/24 1406  "

## 2024-11-21 NOTE — ED Notes (Signed)
 Reviewed AVS/discharge instruction with patient. Time allotted for and all questions answered. Patient is agreeable for d/c and escorted to ed exit by staff.

## 2024-11-30 ENCOUNTER — Ambulatory Visit: Admitting: Family Medicine

## 2024-11-30 ENCOUNTER — Ambulatory Visit: Payer: Self-pay

## 2024-11-30 ENCOUNTER — Encounter: Payer: Self-pay | Admitting: Family Medicine

## 2024-11-30 VITALS — BP 162/86 | HR 70 | Temp 97.8°F | Ht <= 58 in | Wt 130.4 lb

## 2024-11-30 DIAGNOSIS — R296 Repeated falls: Secondary | ICD-10-CM | POA: Diagnosis not present

## 2024-11-30 DIAGNOSIS — S0101XA Laceration without foreign body of scalp, initial encounter: Secondary | ICD-10-CM

## 2024-11-30 DIAGNOSIS — M48061 Spinal stenosis, lumbar region without neurogenic claudication: Secondary | ICD-10-CM | POA: Diagnosis not present

## 2024-11-30 DIAGNOSIS — M4126 Other idiopathic scoliosis, lumbar region: Secondary | ICD-10-CM

## 2024-11-30 DIAGNOSIS — W19XXXA Unspecified fall, initial encounter: Secondary | ICD-10-CM

## 2024-11-30 DIAGNOSIS — M4317 Spondylolisthesis, lumbosacral region: Secondary | ICD-10-CM

## 2024-11-30 LAB — POCT URINALYSIS DIPSTICK
Bilirubin, UA: NEGATIVE
Blood, UA: NEGATIVE
Glucose, UA: NEGATIVE
Ketones, UA: NEGATIVE
Nitrite, UA: NEGATIVE
Protein, UA: POSITIVE — AB
Spec Grav, UA: 1.02
Urobilinogen, UA: 0.2 U/dL
pH, UA: 6

## 2024-11-30 NOTE — Progress Notes (Signed)
 "  Established Patient Office Visit   Subjective  Patient ID: Tabitha Adams, female    DOB: Oct 30, 1946  Age: 79 y.o. MRN: [id number]  Chief Complaint  Patient presents with   Acute Visit    Fall today, right side head pain, neck pain rate 5 out of 10     Patient is a 79 year old female followed by Dr. Jordan and seen for acute concern.  Pt fell today after getting out of her car and walking up her sidewalk. Lost her balance and fell, hitting the side of her head, which resulted in immediate bleeding.  Patient crawled inside and applied pressure to the wound using paper towels.  Not currently on blood thinners.  Reports worsening balance issues and last few weeks, raising concerns among her family.   Pt lives alone, her husband passed away seven years ago.  Pt works at [employer] on her feet for four hours daily on a concrete floor, which causes her legs to ache and swell. Has fallen 3 times in the last week and a half, including at work and at home, resulting in bruising and head injuries.  Has h/o scoliosis and spinal stenosis, for which she has received injections and is scheduled for an ablation procedure on the 27th of this month. Pt states she was unaware of spinal stenosis dx.  Denies dizziness, nausea, vomiting, or urinary symptoms.   Was using a grocery cart or a cane to maintain balance at work, but company policy now requires a doctor's note to continue this practice. She has not eaten much today, only having breakfast and a couple of cookies.    Patient Active Problem List   Diagnosis Date Noted   Iron deficiency anemia due to chronic blood loss 10/16/2023   Routine general medical examination at a health care facility 08/01/2023   Osteoporosis 08/13/2022   Scoliosis 06/28/2022   Prediabetes 08/28/2021   Vaginal vault prolapse 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 03/03/2020   Cystocele, midline 03/03/2020   Benign essential tremor 07/24/2018   Mild recurrent major depression  03/22/2018   Benign paroxysmal positional vertigo due to bilateral vestibular disorder 09/27/2017   Barton's fracture of right radius 01/25/2017   Essential hypertension, benign 10/04/2016   Generalized osteoarthritis of multiple sites 08/31/2016   Chronic back pain 07/13/2016   Hyperlipidemia 07/13/2016   Chronic allergic rhinitis 07/13/2016   GERD (gastroesophageal reflux disease) 07/13/2016   Chronic pain disorder 07/13/2016   Past Medical History:  Diagnosis Date   Allergy    Anemia    Anxiety 2018   Arthritis    Cataract 2015   had them removed   Depression 2018   GERD (gastroesophageal reflux disease)    Hyperlipidemia    Hypertension 2019   Past Surgical History:  Procedure Laterality Date   APPENDECTOMY     BACK SURGERY     BREAST CYST ASPIRATION Left    BREAST EXCISIONAL BIOPSY Left    BREAST EXCISIONAL BIOPSY Left    CATARACT EXTRACTION, BILATERAL     Dental Impalnt  10/21/2023   2 Implants   EYE SURGERY  2015   FRACTURE SURGERY  2017   ORIF WRIST FRACTURE Right 01/25/2017   Procedure: OPEN REDUCTION INTERNAL FIXATION (ORIF) WRIST FRACTURE;  Surgeon: Elsie Mussel, MD;  Location: [facility] OR;  Service: Orthopedics;  Laterality: Right;   TOOTH EXTRACTION  10/21/2023   3 teeth removed   TOTAL ABDOMINAL HYSTERECTOMY     heavy periods   TUBAL LIGATION  1973  Social History[1] Family History  Problem Relation Age of Onset   Other Mother        brain tumor, non cancerous   Early death Mother    Varicose Veins Mother    Stroke Father    Hypertension Father    Dementia Father    Arthritis Sister    Hypercholesterolemia Sister    Cancer Sister    Varicose Veins Sister    Kidney disease Maternal Grandmother    Epilepsy Son    Heart Problems Son    Other Son        breathing problems   Tremor Son    Other Brother        MVA   Allergies[2]  ROS Negative unless stated above    Objective:     BP (!) 162/86 (BP Location: Left Arm, Patient Position:  Sitting, Cuff Size: Normal)   Pulse 70   Temp 97.8 F (36.6 C) (Oral)   Ht 4' 10 (1.473 m)   Wt 130 lb 6.4 oz (59.1 kg)   SpO2 98%   BMI 27.25 kg/m  BP Readings from Last 3 Encounters:  11/30/24 (!) 162/86  11/21/24 (!) 150/92  07/03/24 (!) 140/70   Wt Readings from Last 3 Encounters:  11/30/24 130 lb 6.4 oz (59.1 kg)  07/30/24 130 lb (59 kg)  07/03/24 130 lb 6 oz (59.1 kg)      Physical Exam Constitutional:      General: She is not in acute distress.    Appearance: Normal appearance.  HENT:     Head: Normocephalic and atraumatic.     Nose: Nose normal.     Mouth/Throat:     Mouth: Mucous membranes are moist.  Cardiovascular:     Rate and Rhythm: Normal rate and regular rhythm.     Heart sounds: Normal heart sounds. No murmur heard.    No gallop.  Pulmonary:     Effort: Pulmonary effort is normal. No respiratory distress.     Breath sounds: Normal breath sounds. No wheezing, rhonchi or rales.  Skin:    General: Skin is warm and dry.     Comments: 4.5 mm laceration of R scalp posterior lateral parietal area. Hemostatic.  Area and hair cleaned.  Dermabond applied.  Neurological:     Mental Status: She is alert and oriented to person, place, and time.        07/30/2024    2:55 PM 07/03/2024    8:40 AM 08/01/2023    2:05 PM  Depression screen PHQ 2/9  Decreased Interest 0 2 0  Down, Depressed, Hopeless 0 2 0  PHQ - 2 Score 0 4 0  Altered sleeping 0 2 0  Tired, decreased energy 0 2 0  Change in appetite 0 1 0  Feeling bad or failure about yourself  0 0 0  Trouble concentrating 0 2 0  Moving slowly or fidgety/restless 0 0 0  Suicidal thoughts 0 0 0  PHQ-9 Score 0  11  0   Difficult doing work/chores Not difficult at all Not difficult at all Not difficult at all     Data saved with a previous flowsheet row definition      07/03/2024    8:40 AM 08/28/2021   10:01 PM 02/04/2020    5:55 PM  GAD 7 : Generalized Anxiety Score  Nervous, Anxious, on Edge 2 1 0   Control/stop worrying 1 1 0  Worry too much - different things 1 0 1  Trouble relaxing 0 0 0  Restless 1 0 0  Easily annoyed or irritable 0 0 1  Afraid - awful might happen 0 0 0  Total GAD 7 Score 5 2 2   Anxiety Difficulty Not difficult at all Not difficult at all Not difficult at all     Results for orders placed or performed in visit on 11/30/24  Urine Culture   Specimen: Urine  Result Value Ref Range   MICRO NUMBER: [id number]    SPECIMEN QUALITY: Adequate    Sample Source URINE    STATUS: FINAL    Result:      Less than 10,000 CFU/mL of single Gram positive organism isolated. No further testing will be performed. If clinically indicated, recollection using a method to minimize contamination, with prompt transfer to Urine Culture Transport Tube, is recommended.  POCT urinalysis dipstick  Result Value Ref Range   Color, UA yellow    Clarity, UA Clear    Glucose, UA Negative Negative   Bilirubin, UA neg    Ketones, UA neg    Spec Grav, UA 1.020 1.010 - 1.025   Blood, UA neg    pH, UA 6.0 5.0 - 8.0   Protein, UA Positive (A) Negative   Urobilinogen, UA 0.2 0.2 or 1.0 E.U./dL   Nitrite, UA neg    Leukocytes, UA Trace (A) Negative   Appearance     Odor        Assessment & Plan:   Laceration of scalp, initial encounter  Fall from standing, initial encounter -     POCT urinalysis dipstick -     Urine Culture  Spinal stenosis of lumbar region, unspecified whether neurogenic claudication present  Frequent falls -     Urine Culture  Other idiopathic scoliosis, lumbar region  Spondylolisthesis of lumbosacral region   Hemostatic laceration of scalp status post fall earlier today.  Consent obtained.  Area cleaned and Dermabond applied.  Given instructions on care.  Concern for frequent falls.  POC UA with trace leuks.  Obtain UCX.  Treat for infection if needed.  Questions answered regarding spinal stenosis, spondylolisthesis, and scoliosis.  Advised spinal stenosis  may be contributing to frequent falls.  Request for work accommodations forwarded to PCP.  Continue follow-up with specialist.  Given strict precautions.  Return if symptoms worsen or fail to improve.   Clotilda JONELLE Single, MD      [1]  Social History Tobacco Use   Smoking status: Former    Types: Cigarettes   Smokeless tobacco: Never   Tobacco comments:    Smoked from 1967 to 1985 max 1.25 PPD  Vaping Use   Vaping status: Never Used  Substance Use Topics   Alcohol use: No   Drug use: No  [2]  Allergies Allergen Reactions   Cymbalta  [Duloxetine  Hcl] Nausea Only    Adverse reaction caused dizziness and nausea   Elemental Sulfur Swelling and Other (See Comments)    Reaction:  All over body swelling    Penicillins Rash and Other (See Comments)    Has patient had a PCN reaction causing immediate rash, facial/tongue/throat swelling, SOB or lightheadedness with hypotension: No Has patient had a PCN reaction causing severe rash involving mucus membranes or skin necrosis: No Has patient had a PCN reaction that required hospitalization No Has patient had a PCN reaction occurring within the last 10 years: No If all of the above answers are NO, then may proceed with Cephalosporin use.   "

## 2024-11-30 NOTE — Telephone Encounter (Signed)
 FYI Only or Action Required?: FYI only for provider: appointment scheduled on 12/01/23.  Patient was last seen in primary care on 07/03/2024 by Jordan, [name], MD.  Called Nurse Triage reporting Fall and Head Injury.  Symptoms began today.  Interventions attempted: Other: pressure held to stop bleeding.  Symptoms are: stable.  Triage Disposition: See HCP Within 4 Hours (Or PCP Triage)  Patient/caregiver understands and will follow disposition?: Yes                     Message from China J sent at 11/30/2024 12:31 PM EST  Reason for Triage: Patient fell of the sidewalk and cracked her head.   Reason for Disposition  [1] Age over 64 years AND [2] swelling or bruise  Answer Assessment - Initial Assessment Questions 1. MECHANISM: How did the injury happen? For falls, ask: What height did you fall from? and What surface did you fall against?      Lost balance, fell on sidewalk. Hit head. Landed on concrete.   2. ONSET: When did the injury happen? (e.g., minutes, hours ago)      45 minutes prior to calling.  3. NEUROLOGIC SYMPTOMS: Was there any loss of consciousness? Are there any other neurological symptoms?      No.  4. MENTAL STATUS: Does the person know who they are, who you are, and where they are?      Alert and oriented x4.  5. LOCATION: What part of the head was hit?      Right side/back just above and behind the right ear.  6. SCALP APPEARANCE: What does the scalp look like? Is it bleeding now? If Yes, ask: Is it difficult to stop?      Yes it was bleeding, was able to stop after holding pressure.  7. SIZE: For cuts, bruises, or swelling, ask: How large is it? (e.g., inches or centimeters)      Laceration unsure if one or two thinks about 0.5-1 inch and swelling/knot about 1.5 inches.  8. PAIN: Is there any pain? If Yes, ask: How bad is it? (Scale 0-10; or none, mild, moderate, severe)     4-5/10.  9. TETANUS: For any  breaks in the skin, ask: When was your last tetanus booster?     07/2022.  10. BLOOD THINNERS: Do you take any blood thinners? (e.g., aspirin, clopidogrel / Plavix, coumadin, heparin). Notes: Other strong blood thinners include: Arixtra (fondaparinux), Eliquis (apixaban), Pradaxa (dabigatran), and Xarelto (rivaroxaban).       No.  11. OTHER SYMPTOMS: Do you have any other symptoms? (e.g., neck pain, vomiting)       No slurred speech, confusion, weakness, numbness, LOC, seizure, vomiting, loss of vision, double vision, use of blood thinners, ear or nose discharge  Protocols used: Head Injury-A-AH

## 2024-12-01 LAB — URINE CULTURE
MICRO NUMBER:: 17479270
SPECIMEN QUALITY:: ADEQUATE

## 2024-12-02 ENCOUNTER — Ambulatory Visit: Payer: Self-pay | Admitting: Family Medicine

## 2024-12-03 ENCOUNTER — Telehealth: Payer: Self-pay

## 2024-12-03 ENCOUNTER — Telehealth: Payer: Self-pay | Admitting: Family Medicine

## 2024-12-03 DIAGNOSIS — W19XXXA Unspecified fall, initial encounter: Secondary | ICD-10-CM

## 2024-12-03 NOTE — Telephone Encounter (Signed)
 Paperwork is sitting on Dr. Gib desk.   Copied from CRM [id number]. Topic: General - Other >> Dec 03, 2024 12:34 PM Viola F wrote: Reason for CRM: Patient called to follow up on paperwork that was dropped off on Friday - please call her with an update at  [phone]

## 2024-12-03 NOTE — Telephone Encounter (Unsigned)
 Copied from CRM [id number]. Topic: Referral - Request for Referral >> Dec 03, 2024 12:33 PM Viola F wrote: Did the patient discuss referral with their provider in the last year? Yes (If No - schedule appointment) (If Yes - send message)  Appointment offered? No  Type of order/referral and detailed reason for visit: Falling   Preference of office, provider, location: [facility]  If referral order, have you been seen by this specialty before? No (If Yes, this issue or another issue? When? Where?  Can we respond through MyChart? Yes

## 2024-12-05 NOTE — Telephone Encounter (Signed)
 She has seen neurologist in the past for dizziness. Does she want to try PT for fall prevention first? If she still want to see neuro for falls, please place referral with Dx frequent falls/unstable gait and pt request. Thanks, BJ

## 2024-12-05 NOTE — Telephone Encounter (Signed)
 Referral has been placed.

## 2024-12-19 ENCOUNTER — Telehealth: Payer: Self-pay | Admitting: Family Medicine

## 2024-12-19 ENCOUNTER — Telehealth: Payer: Self-pay

## 2024-12-19 NOTE — Telephone Encounter (Signed)
 Patient came in on 11/30/2024 and stated she left a form for ADA to be completed, there is no documentation of such in patients chart, patient has shown me an ADA Interactive Process Response to request a note from the doctor stating she can use an assistive device while working, along with specific restrictions.  The note must have an end date or if permanent this must be stated on the note.  Please advise patient at [phone].

## 2024-12-19 NOTE — Telephone Encounter (Signed)
 It is ok to provide a Rx for a walker. Does she want to pick it up or for us  to send it to a local medical supplier? Thanks, BJ

## 2024-12-19 NOTE — Telephone Encounter (Signed)
 FYI:  Per Cuzzola, Cheri   Hi, Tabitha Adams MRN [id number] came in this afternoon, another patient saw her getting into her car and noticed she could not see and was having trouble with her driving, can you please talk with Dr. Jordan and possibly have Dr Jordan discuss alternative driving options?  Thank you.

## 2024-12-20 NOTE — Telephone Encounter (Signed)
 Patient is not requesting a walker, she is asking for a note stating that she can use an assistive device while working along with specific restrictions she may have and an end date or permanent date on the letter.

## 2024-12-21 ENCOUNTER — Other Ambulatory Visit: Payer: Self-pay | Admitting: Family Medicine

## 2024-12-21 DIAGNOSIS — M159 Polyosteoarthritis, unspecified: Secondary | ICD-10-CM

## 2024-12-21 DIAGNOSIS — G894 Chronic pain syndrome: Secondary | ICD-10-CM

## 2024-12-21 NOTE — Telephone Encounter (Signed)
 Referral to care management placed to evaluate for transportation options. BJ

## 2024-12-21 NOTE — Telephone Encounter (Signed)
 [facility]

## 2024-12-21 NOTE — Telephone Encounter (Signed)
 Spoke with the patient and figured everything out and patient is going to come and pick up a form stating she needs a cane permanently to help/allow her to work effectively

## 2025-01-07 ENCOUNTER — Ambulatory Visit: Admitting: Family Medicine

## 2025-03-26 ENCOUNTER — Ambulatory Visit: Admitting: Neurology

## 2025-08-05 ENCOUNTER — Ambulatory Visit
